# Patient Record
Sex: Male | Born: 1970 | Race: White | Hispanic: No | Marital: Married | Smoking: Never smoker
Health system: Southern US, Community
[De-identification: ages and names within clinical notes are randomized; demographics above are authoritative.]

## PROBLEM LIST (undated history)

## (undated) DIAGNOSIS — I2510 Atherosclerotic heart disease of native coronary artery without angina pectoris: Secondary | ICD-10-CM

## (undated) DIAGNOSIS — F419 Anxiety disorder, unspecified: Secondary | ICD-10-CM

## (undated) DIAGNOSIS — I2584 Coronary atherosclerosis due to calcified coronary lesion: Secondary | ICD-10-CM

## (undated) DIAGNOSIS — E119 Type 2 diabetes mellitus without complications: Secondary | ICD-10-CM

## (undated) DIAGNOSIS — I10 Essential (primary) hypertension: Secondary | ICD-10-CM

## (undated) DIAGNOSIS — I25110 Atherosclerotic heart disease of native coronary artery with unstable angina pectoris: Secondary | ICD-10-CM

## (undated) DIAGNOSIS — Z9989 Dependence on other enabling machines and devices: Secondary | ICD-10-CM

## (undated) DIAGNOSIS — G4733 Obstructive sleep apnea (adult) (pediatric): Secondary | ICD-10-CM

## (undated) DIAGNOSIS — E7800 Pure hypercholesterolemia, unspecified: Secondary | ICD-10-CM

## (undated) DIAGNOSIS — Z951 Presence of aortocoronary bypass graft: Secondary | ICD-10-CM

## (undated) HISTORY — PX: ORCHIOPEXY: SHX479

---

## 2016-07-16 ENCOUNTER — Emergency Department (HOSPITAL_COMMUNITY): Payer: 59

## 2016-07-16 ENCOUNTER — Other Ambulatory Visit: Payer: Self-pay | Admitting: Physician Assistant

## 2016-07-16 ENCOUNTER — Emergency Department (EMERGENCY_DEPARTMENT_HOSPITAL)
Admission: EM | Admit: 2016-07-16 | Discharge: 2016-07-16 | Disposition: A | Discharge status: Home / Self Care | Payer: 59 | Source: Home / Self Care | Attending: Emergency Medicine | Admitting: Emergency Medicine

## 2016-07-16 ENCOUNTER — Encounter (HOSPITAL_COMMUNITY): Payer: Self-pay | Admitting: *Deleted

## 2016-07-16 DIAGNOSIS — Z794 Long term (current) use of insulin: Secondary | ICD-10-CM | POA: Diagnosis not present

## 2016-07-16 DIAGNOSIS — F419 Anxiety disorder, unspecified: Secondary | ICD-10-CM | POA: Insufficient documentation

## 2016-07-16 DIAGNOSIS — E119 Type 2 diabetes mellitus without complications: Secondary | ICD-10-CM | POA: Insufficient documentation

## 2016-07-16 DIAGNOSIS — Z79899 Other long term (current) drug therapy: Secondary | ICD-10-CM

## 2016-07-16 DIAGNOSIS — Z7982 Long term (current) use of aspirin: Secondary | ICD-10-CM

## 2016-07-16 DIAGNOSIS — I25110 Atherosclerotic heart disease of native coronary artery with unstable angina pectoris: Secondary | ICD-10-CM | POA: Diagnosis present

## 2016-07-16 DIAGNOSIS — E785 Hyperlipidemia, unspecified: Secondary | ICD-10-CM | POA: Diagnosis present

## 2016-07-16 DIAGNOSIS — I2510 Atherosclerotic heart disease of native coronary artery without angina pectoris: Secondary | ICD-10-CM | POA: Insufficient documentation

## 2016-07-16 DIAGNOSIS — Z8249 Family history of ischemic heart disease and other diseases of the circulatory system: Secondary | ICD-10-CM

## 2016-07-16 DIAGNOSIS — I200 Unstable angina: Secondary | ICD-10-CM | POA: Diagnosis not present

## 2016-07-16 DIAGNOSIS — I10 Essential (primary) hypertension: Secondary | ICD-10-CM | POA: Diagnosis present

## 2016-07-16 DIAGNOSIS — R079 Chest pain, unspecified: Secondary | ICD-10-CM

## 2016-07-16 DIAGNOSIS — I2584 Coronary atherosclerosis due to calcified coronary lesion: Secondary | ICD-10-CM

## 2016-07-16 DIAGNOSIS — Z79891 Long term (current) use of opiate analgesic: Secondary | ICD-10-CM

## 2016-07-16 HISTORY — DX: Essential (primary) hypertension: I10

## 2016-07-16 HISTORY — DX: Anxiety disorder, unspecified: F41.9

## 2016-07-16 HISTORY — DX: Coronary atherosclerosis due to calcified coronary lesion: I25.84

## 2016-07-16 HISTORY — DX: Atherosclerotic heart disease of native coronary artery without angina pectoris: I25.10

## 2016-07-16 LAB — I-STAT TROPONIN, ED
TROPONIN I, POC: 0.01 ng/mL (ref 0.00–0.08)
Troponin i, poc: 0 ng/mL (ref 0.00–0.08)

## 2016-07-16 LAB — CBC
HCT: 45.1 % (ref 39.0–52.0)
HEMOGLOBIN: 15.5 g/dL (ref 13.0–17.0)
MCH: 32.4 pg (ref 26.0–34.0)
MCHC: 34.4 g/dL (ref 30.0–36.0)
MCV: 94.4 fL (ref 78.0–100.0)
PLATELETS: 336 10*3/uL (ref 150–400)
RBC: 4.78 MIL/uL (ref 4.22–5.81)
RDW: 12.1 % (ref 11.5–15.5)
WBC: 9.4 10*3/uL (ref 4.0–10.5)

## 2016-07-16 LAB — BASIC METABOLIC PANEL
Anion gap: 11 (ref 5–15)
BUN: 17 mg/dL (ref 6–20)
CALCIUM: 9.1 mg/dL (ref 8.9–10.3)
CO2: 23 mmol/L (ref 22–32)
CREATININE: 0.79 mg/dL (ref 0.61–1.24)
Chloride: 100 mmol/L — ABNORMAL LOW (ref 101–111)
GFR calc Af Amer: >60 mL/min (ref >60)
GLUCOSE: 276 mg/dL — AB (ref 65–99)
Potassium: 4.7 mmol/L (ref 3.5–5.1)
Sodium: 134 mmol/L — ABNORMAL LOW (ref 135–145)

## 2016-07-16 MED ORDER — ASPIRIN 81 MG PO CHEW: 81.0000 mg | CHEWABLE_TABLET | Freq: Every day | ORAL | 0 refills | Status: AC

## 2016-07-16 MED ORDER — IOPAMIDOL (ISOVUE-370) INJECTION 76%
INTRAVENOUS | Status: AC
  Administered 2016-07-16: 100 mL
  Filled 2016-07-16: qty 100

## 2016-07-16 NOTE — ED Triage Notes (Signed)
PT states started having chest pain when he was on the treadmill and now with exertion he is having chest pain, radiates to back and gets sob.  He has to stop what he is doing

## 2016-07-16 NOTE — ED Notes (Signed)
Repaged cards for Dr. Donnald GarrePfeiffer

## 2016-07-16 NOTE — ED Notes (Signed)
Patient transported to CT 

## 2016-07-16 NOTE — ED Provider Notes (Addendum)
[facility]-EMERGENCY DEPT Provider Note   CSN: [id number] Arrival date & time: 07/16/16  1037     History   Chief Complaint Chief Complaint  Patient presents with  . Chest Pain  . Back Pain    HPI Marylou MccoyBrandon Hollenberg is a 46 y.o. male.  HPI  This is a 46 year old man with Back and chest pain for the past several weeks. He describes the pain as occurring medial to the right scapula and going through his chest to the front. It occurs with exertion. He states that he previously exercised by running on the treadmill. He initially started having chest pain after about 10 minutes. He has to stop to relieve the pain. He has had worsening pain with exertion. He came in today because he states that he is unable to do his job consists of walking. He states to walk across ER is causing him pain today. He is not having pain while sitting here in the emergency department. He denies any associated symptoms such as dyspnea, sweating, nausea, vomiting, or leg swelling. He is denies any history of DVT or PE.   Past Medical History:  Diagnosis Date  . Diabetes mellitus without complication (HCC)     There are no active problems to display for this patient.   History reviewed. No pertinent surgical history.     Home Medications    Prior to Admission medications   Medication Sig Start Date End Date Taking? Authorizing Provider  aspirin 81 MG chewable tablet Chew 81 mg by mouth daily.   Yes [provider]  atorvastatin (LIPITOR) 10 MG tablet Take 10 mg by mouth daily.   Yes [provider]  glimepiride (AMARYL) 1 MG tablet Take 1 mg by mouth daily with breakfast.   Yes [provider]  insulin glargine (LANTUS) 100 UNIT/ML injection Inject 50 Units into the skin at bedtime.   Yes [provider]  lisinopril (PRINIVIL,ZESTRIL) 5 MG tablet Take 5 mg by mouth daily.   Yes [provider]  metFORMIN (GLUCOPHAGE) 1000 MG tablet Take 1,000 mg by mouth 2 (two) times  daily with a meal.   Yes [provider]  rOPINIRole (REQUIP) 0.5 MG tablet Take 0.25 mg by mouth at bedtime.   Yes [provider]  sertraline (ZOLOFT) 100 MG tablet Take 100 mg by mouth daily.   Yes [provider]    Family History No family history on file.  Social History Social History  Substance Use Topics  . Smoking status: Never Smoker  . Smokeless tobacco: Never Used  . Alcohol use No     Allergies   Patient has no known allergies.   Review of Systems Review of Systems  All other systems reviewed and are negative.    Physical Exam Updated Vital Signs BP (!) 127/95   Pulse (!) 102   Temp 98.3 F (36.8 C) (Oral)   Resp 16   SpO2 97%   Physical Exam  Constitutional: He is oriented to person, place, and time. He appears well-developed and well-nourished.  HENT:  Head: Normocephalic and atraumatic.  Eyes: EOM are normal. Pupils are equal, round, and reactive to light.  Neck: Normal range of motion. Neck supple.  Cardiovascular: Normal rate and regular rhythm.   Pulmonary/Chest: Effort normal and breath sounds normal.  Abdominal: Soft. Bowel sounds are normal.  Musculoskeletal: Normal range of motion. He exhibits no edema, tenderness or deformity.  Neurological: He is alert and oriented to person, place, and time.  Skin: Skin is warm and dry. Capillary refill takes less than 2 seconds.  Psychiatric: He has a normal mood and affect.  Nursing note and vitals reviewed.    ED Treatments / Results  Labs (all labs ordered are listed, but only abnormal results are displayed) Labs Reviewed  BASIC METABOLIC PANEL - Abnormal; Notable for the following:       Result Value   Sodium 134 (*)    Chloride 100 (*)    Glucose, Bld 276 (*)    All other components within normal limits  CBC  I-STAT TROPOININ, ED    EKG  EKG Interpretation  Date/Time:  Monday Jul 16 2016 10:43:38 EDT Ventricular Rate:  107 PR Interval:  136 QRS  Duration: 84 QT Interval:  320 QTC Calculation: 427 R Axis:   76 Text Interpretation:  Sinus tachycardia Otherwise normal ECG Confirmed by Luwanna Brossman MD, Duwayne Heck [phone]) on 07/16/2016 2:23:16 PM       Radiology Dg Chest 2 View  Result Date: 07/16/2016 CLINICAL DATA:  Chest pain for 2-3 weeks. EXAM: CHEST  2 VIEW COMPARISON:  None. FINDINGS: The heart size and mediastinal contours are within normal limits. Both lungs are clear. The visualized skeletal structures are unremarkable. IMPRESSION: Normal chest x-Barbarita Hutmacher. Electronically Signed   By: Rudie Meyer M.D.   On: 07/16/2016 11:20    Procedures Procedures (including critical care time)  Medications Ordered in ED Medications - No data to display   Initial Impression / Assessment and Plan / ED Course  I have reviewed the triage vital signs and the nursing notes.  Pertinent labs & imaging results that were available during my care of the patient were reviewed by me and considered in my medical decision making (see chart for details).     This 46 year old man with type 2 insulin-dependent diabetes And unstable angina. Initial EKG shows sinus tachycardia but no evidence of acute ischemia. Patient takes aspirin daily and took it today. CT anterior chest ordered to assess for pulmonary embolism and aortic dissection. However, given type pain is highly suspicious for cardiac etiology. Discussed with Dr. Clarice Pole. If CT is negative, plan admission to cardiology.  Final Clinical Impressions(s) / ED Diagnoses   Final diagnoses:  Chest pain    New Prescriptions New Prescriptions   No medications on file     Margarita Grizzle, MD 07/16/16 1603    Margarita Grizzle, MD 07/16/16 [phone]

## 2016-07-16 NOTE — ED Provider Notes (Signed)
Patient accepted signout from Dr. Rosalia Hammersay. Patient has had increasing episodes of chest pain with exertion suggestive of unstable angina. Patient advised he could not stay in the hospital but was agreeable to cardiology consultation in the emergency department. After consultation with cardiology, they also advised for inpatient treatment. The patient refuses inpatient treatment stating he must go home and has to resolve some things at work tomorrow. Outpatient follow-up appointment has been arranged by cardiology for immediate outpatient work-up.   [name] [name], Deuntae Kocsis, MD 07/16/16 2011

## 2016-07-16 NOTE — ED Notes (Signed)
Pt stable, ambulatory, states understanding of discharge instructions 

## 2016-07-16 NOTE — Consult Note (Signed)
Patient ID: [name] [name] MRN: [id number], DOB/AGE: [birth year]   Admit date: 07/16/2016  Requesting physician: Dr. Donnald Garre Primary Physician: Joline Salt, MD Primary Cardiologist: New Reason for admission: Chest pain  HPI:  [name] [name] is a 46 y.o. male with a history of IDDM, anxiety and HTN who presented to [facility] today with exertional mid back pain/epigastric pain.   The patient was diagnosed with diabetes in his late 20s/early 30s. He was maintained on oral anti-hyperglycemics but has more recently been on insulin. He says his hemoglobin A1c runs in the 8s. He regularly follows with the primary care physician and is maintained on an ACE inhibitor and statin. He was in his usual state of health until recently when he started noticing exertional central back pain with radiation into his epigastric area. He works for [employer] and carries a backpack and is very active on the job. He also exercises on a treadmill about 3 times a week. Yesterday he tried to walk on the treadmill and had to get off because of the exertional back pain/epigastric pain. It always quickly resolves with rest. He reports some slight shortness of breath. No diaphoresis or nausea. No LE edema, orthopnea or PND. No dizziness or syncope. No blood in stool or urine. No palpitations.   Today it continued to bother him at work so he presented to the [facility] ER for further evaluation. ECG showed sinus tachycardia. Troponin has been negative. CT angio showed no evidence of pulmonary embolism or aortic dissection; however it did comment on disproportionate coronary calcium.   Problem List  Past Medical History:  Diagnosis Date  . Anxiety   . Coronary artery calcification    a. noted on CT angio 07/16/16  . HTN (hypertension)   . Type 2 diabetes mellitus (HCC)     History reviewed. No pertinent surgical history.   Allergies  No Known Allergies   Home Medications  Prior to Admission medications     Medication Sig Start Date End Date Taking? Authorizing Provider  aspirin 81 MG chewable tablet Chew 81 mg by mouth daily.   Yes [provider]  atorvastatin (LIPITOR) 10 MG tablet Take 10 mg by mouth daily.   Yes [provider]  glimepiride (AMARYL) 1 MG tablet Take 1 mg by mouth daily with breakfast.   Yes [provider]  insulin glargine (LANTUS) 100 UNIT/ML injection Inject 50 Units into the skin at bedtime.   Yes [provider]  lisinopril (PRINIVIL,ZESTRIL) 5 MG tablet Take 5 mg by mouth daily.   Yes [provider]  metFORMIN (GLUCOPHAGE) 1000 MG tablet Take 1,000 mg by mouth 2 (two) times daily with a meal.   Yes [provider]  rOPINIRole (REQUIP) 0.5 MG tablet Take 0.25 mg by mouth at bedtime.   Yes [provider]  sertraline (ZOLOFT) 100 MG tablet Take 100 mg by mouth daily.   Yes [provider]    Family History  Family History  Problem Relation Age of Onset  . Heart failure Mother   . CAD Paternal Grandfather       Family Status  Relation Status  . Mother Deceased  . Paternal Grandfather Deceased     Social History  Social History   Social History  . Marital status: Married    Spouse name: N/A  . Number of children: N/A  . Years of education: N/A   Occupational History  . Not on file.   Social History Main  Topics  . Smoking status: Never Smoker  . Smokeless tobacco: Never Used  . Alcohol use No  . Drug use: No  . Sexual activity: Not on file   Other Topics Concern  . Not on file   Social History Narrative  . No narrative on file     Review of Systems General:  No chills, fever, night sweats or weight changes.  Cardiovascular:  +++ chest pain (epigastric/back pain), dyspnea on exertion, edema, orthopnea, palpitations, paroxysmal nocturnal dyspnea. Dermatological: No rash, lesions/masses Respiratory: No cough, dyspnea Urologic: No hematuria, dysuria Abdominal:   No  nausea, vomiting, diarrhea, bright red blood per rectum, melena, or hematemesis Neurologic:  No visual changes, wkns, changes in mental status. All other systems reviewed and are otherwise negative except as noted above.  Physical Exam  Blood pressure (!) 150/89, pulse (!) 116, temperature 98.3 F (36.8 C), temperature source Oral, resp. rate 13, SpO2 95 %.  General: Pleasant, NAD Psych: Normal affect. Neuro: Alert and oriented X 3. Moves all extremities spontaneously. HEENT: Normal  Neck: Supple without bruits or JVD. Lungs:  Resp regular and unlabored, CTA. Heart: RRR no s3, s4, or murmurs. Abdomen: Soft, non-tender, non-distended, BS + x 4.  Extremities: No clubbing, cyanosis or edema. DP/PT/Radials 2+ and equal bilaterally.  Labs  No results for input(s): CKTOTAL, CKMB, TROPONINI in the last 72 hours. Lab Results  Component Value Date   WBC 9.4 07/16/2016   HGB 15.5 07/16/2016   HCT 45.1 07/16/2016   MCV 94.4 07/16/2016   PLT 336 07/16/2016     Recent Labs Lab 07/16/16 1045  NA 134*  K 4.7  CL 100*  CO2 23  BUN 17  CREATININE 0.79  CALCIUM 9.1  GLUCOSE 276*   No results found for: CHOL, HDL, LDLCALC, TRIG No results found for: DDIMER   Radiology/Studies  Dg Chest 2 View  Result Date: 07/16/2016 CLINICAL DATA:  Chest pain for 2-3 weeks. EXAM: CHEST  2 VIEW COMPARISON:  None. FINDINGS: The heart size and mediastinal contours are within normal limits. Both lungs are clear. The visualized skeletal structures are unremarkable. IMPRESSION: Normal chest x-ray. Electronically Signed   By: Rudie Meyer M.D.   On: 07/16/2016 11:20   Ct Angio Chest Pe W Or Wo Contrast  Result Date: 07/16/2016 CLINICAL DATA:  Back pain for 1 week. EXAM: CT ANGIOGRAPHY CHEST WITH CONTRAST TECHNIQUE: Multidetector CT imaging of the chest was performed using the standard protocol during bolus administration of intravenous contrast. Multiplanar CT image reconstructions and MIPs were obtained  to evaluate the vascular anatomy. CONTRAST:  100 cc Isovue 370 COMPARISON:  07/16/2016 radiographs FINDINGS: Cardiovascular: Timing on today' s exam was optimized for assessment of the pulmonary arteries. No filling defect is identified in the pulmonary arterial tree to suggest pulmonary embolus. There is adequate contrast in the systemic arterial supply to assess the aorta, and no dissection is observed. No compelling findings of acute aortic injury. There does appear to be coronary artery atherosclerosis involving the left anterior descending and circumflex coronary arteries. This seems advanced for age, and potentially disproportionate to atherosclerosis in the aorta. Mediastinum/Nodes: There is potentially some mild wall thickening in the distal esophagus, a likely cause would be esophagitis. Lungs/Pleura: Unremarkable Upper Abdomen: Unremarkable Musculoskeletal: Unremarkable Review of the MIP images confirms the above findings. IMPRESSION: 1. No filling defect is identified in the pulmonary arterial tree to suggest pulmonary embolus. No acute aortic findings are identified. 2. Potential mild wall thickening in the distal  esophagus, esophagitis not excluded. Electronically Signed   By: Gaylyn RongWalter  Liebkemann M.D.   On: 07/16/2016 17:35    ECG Sinus tachycardia, HR 107  ASSESSMENT AND PLAN  Exertional chest pain: actually more central back pain and epigastric pain. It occurs with exertion and relieved with rest. Troponin neg x2 and ECG without acute ST or TW changes. CTA negative for PE or aortic dissection but did show disproportionate coronary calcium for age. We have recommended that he come in for observation and inpatient evaluation but he is very clear that he would like to go home. We will have an outpatient nuclear stress test arranged with close follow up. I have sent a message to our office for a stress test to be arranged ASAP. I will see him back in the office next week. Would recommend he start  taking a daily baby ASA  Sinus Tachycardia: he thinks this is related to his anxiety.   DMT2: continue current reigmen. Continue statin.   HTN: BP well controlled    Signed, Cline CrockKathryn Thompson, PA-C 07/16/2016, 8:06 PM  Pager [phone]   Attending note:  Patient seen and examined. Reviewed records and discussed the case with Ms. Molson Coors Brewinghompson PA-C.. We were consulted by Dr. Clarice PolePfeifer to evaluate Mr. Manson PasseyBrown for chest pain symptoms. He has a 15 year history of type 2 diabetes mellitus, also hypertension and anxiety. Follows regularly with PCP and reports compliance with his medications. He works for [employer], fairly active carrying a backpack, also exercises on the treadmill. States that over the last 2 weeks he has been experiencing exertional posterior thoracic discomfort radiating into his chest. He has noted this regularly in the last few days, no associated breathlessness, sometimes does have to slow down when he is walking on the treadmill and has the symptoms. He has not undergone any prior ischemic testing.  On examination he is comfortable without active chest pain. Heart rate 90s to 100s in sinus rhythm, systolic blood pressure 150s. Lungs are clear without labored breathing, cardiac exam with RRR and no gallop. Troponin I levels are negative 2, creatinine 0.79, hemoglobin 15.5, glucose 276. CT angiogram of the chest was negative for pulmonary embolus, incidentally did mention coronary artery atherosclerosis within the left anterior descending and circumflex distribution. Also mild esophageal wall thickening distally, rule out esophagitis.  Situation discussed with patient and his wife Music therapist(nurse). I recommended hospital admission for observation to further cycle cardiac enzymes and likely pursue an inpatient Myoview tomorrow morning. He states that he wants to go home instead and would rather pursue outpatient workup. His wife mentions that he does have a lot of trouble with anxiety, and being in the  hospital would make this much worse. We discussed further warning signs and symptoms, a return to the ER immediately if symptoms escalate, also the increased risk of delaying further workup as an outpatient. He states he understands this risk and is willing to accept it. We are therefore going to arrange for an outpatient exercise Myoview in the office within the next 24-48 hours, office will call him with time. He will have follow-up to review results.  Jonelle SidleSamuel G. Laneka Mcgrory, M.D., F.A.C.C.

## 2016-07-17 ENCOUNTER — Telehealth (HOSPITAL_COMMUNITY): Payer: Self-pay | Admitting: *Deleted

## 2016-07-17 NOTE — Telephone Encounter (Signed)
Patient given detailed instructions per Myocardial Perfusion Study Information Sheet for the test on 07/19/16 Patient notified to arrive 15 minutes early and that it is imperative to arrive on time for appointment to keep from having the test rescheduled.  If you need to cancel or reschedule your appointment, please call the office within 24 hours of your appointment. Failure to do so may result in a cancellation of your appointment, and a $50 no show fee. Patient verbalized understanding. Karron Goens Jacqueline    

## 2016-07-17 NOTE — ED Provider Notes (Signed)
Pt's employer faxed paperwork with regards to him returning to work. In reviewing the chart, it is completely inappropriate/unsafe for him to return to work without first having further testing/evaluation.   Raeford RazorKohut, [place] Doster, MD 07/17/16 [phone]

## 2016-07-19 ENCOUNTER — Observation Stay (HOSPITAL_COMMUNITY)
Admission: AD | Admit: 2016-07-19 | Discharge: 2016-07-20 | DRG: 287 | Disposition: A | Discharge status: Home / Self Care | Payer: 59 | Source: Ambulatory Visit | Attending: Cardiovascular Disease | Admitting: Cardiovascular Disease

## 2016-07-19 ENCOUNTER — Encounter (HOSPITAL_COMMUNITY): Payer: Self-pay | Admitting: General Practice

## 2016-07-19 ENCOUNTER — Encounter (HOSPITAL_COMMUNITY)
Admission: AD | Disposition: A | Discharge status: Home / Self Care | Payer: Self-pay | Source: Ambulatory Visit | Attending: Cardiovascular Disease

## 2016-07-19 ENCOUNTER — Encounter: Payer: Self-pay | Admitting: Internal Medicine

## 2016-07-19 ENCOUNTER — Ambulatory Visit (INDEPENDENT_AMBULATORY_CARE_PROVIDER_SITE_OTHER): Payer: 59 | Admitting: Internal Medicine

## 2016-07-19 ENCOUNTER — Encounter: Payer: Self-pay | Admitting: *Deleted

## 2016-07-19 ENCOUNTER — Ambulatory Visit (HOSPITAL_BASED_OUTPATIENT_CLINIC_OR_DEPARTMENT_OTHER): Payer: 59

## 2016-07-19 VITALS — BP 122/86 | HR 108 | Ht 69.0 in | Wt 175.0 lb

## 2016-07-19 DIAGNOSIS — I200 Unstable angina: Secondary | ICD-10-CM

## 2016-07-19 DIAGNOSIS — R9439 Abnormal result of other cardiovascular function study: Secondary | ICD-10-CM

## 2016-07-19 DIAGNOSIS — R079 Chest pain, unspecified: Secondary | ICD-10-CM

## 2016-07-19 DIAGNOSIS — I25110 Atherosclerotic heart disease of native coronary artery with unstable angina pectoris: Secondary | ICD-10-CM

## 2016-07-19 DIAGNOSIS — I2510 Atherosclerotic heart disease of native coronary artery without angina pectoris: Secondary | ICD-10-CM

## 2016-07-19 HISTORY — PX: LEFT HEART CATH AND CORONARY ANGIOGRAPHY: CATH118249

## 2016-07-19 HISTORY — DX: Pure hypercholesterolemia, unspecified: E78.00

## 2016-07-19 HISTORY — PX: CARDIAC CATHETERIZATION: SHX172

## 2016-07-19 HISTORY — DX: Obstructive sleep apnea (adult) (pediatric): G47.33

## 2016-07-19 HISTORY — DX: Dependence on other enabling machines and devices: Z99.89

## 2016-07-19 LAB — CBC
HEMATOCRIT: 41.7 % (ref 39.0–52.0)
HEMOGLOBIN: 14.7 g/dL (ref 13.0–17.0)
MCH: 32.5 pg (ref 26.0–34.0)
MCHC: 35.3 g/dL (ref 30.0–36.0)
MCV: 92.3 fL (ref 78.0–100.0)
Platelets: 316 10*3/uL (ref 150–400)
RBC: 4.52 MIL/uL (ref 4.22–5.81)
RDW: 12.1 % (ref 11.5–15.5)
WBC: 8.3 10*3/uL (ref 4.0–10.5)

## 2016-07-19 LAB — MYOCARDIAL PERFUSION IMAGING
CHL CUP MPHR: 176 {beats}/min
CHL CUP NUCLEAR SDS: 23
CHL CUP NUCLEAR SRS: 4
CHL CUP NUCLEAR SSS: 27
CSEPED: 9 min
CSEPEW: 10.1 METS
CSEPHR: 85 %
Exercise duration (sec): 31 s
LHR: 0.3
LV dias vol: 104 mL (ref 62–150)
LV sys vol: 60 mL
Peak HR: 150 {beats}/min
RPE: 19
Rest HR: 90 {beats}/min
TID: 1.11

## 2016-07-19 LAB — CREATININE, SERUM: Creatinine, Ser: 0.73 mg/dL (ref 0.61–1.24)

## 2016-07-19 LAB — PROTIME-INR
INR: 0.92
Prothrombin Time: 12.4 seconds (ref 11.4–15.2)

## 2016-07-19 LAB — GLUCOSE, CAPILLARY
GLUCOSE-CAPILLARY: 93 mg/dL (ref 65–99)
GLUCOSE-CAPILLARY: 164 mg/dL — AB (ref 65–99)
Glucose-Capillary: 187 mg/dL — ABNORMAL HIGH (ref 65–99)

## 2016-07-19 SURGERY — LEFT HEART CATH AND CORONARY ANGIOGRAPHY
Anesthesia: LOCAL

## 2016-07-19 MED ORDER — SODIUM CHLORIDE 0.9 % IV SOLN: 250.0000 mL | INTRAVENOUS | Status: DC | PRN

## 2016-07-19 MED ORDER — SODIUM CHLORIDE 0.9 % WEIGHT BASED INFUSION
3.0000 mL/kg/h | INTRAVENOUS | Status: DC
  Administered 2016-07-19: 3 mL/kg/h via INTRAVENOUS

## 2016-07-19 MED ORDER — ONDANSETRON HCL 4 MG/2ML IJ SOLN: 4.0000 mg | Freq: Four times a day (QID) | INTRAMUSCULAR | Status: DC | PRN

## 2016-07-19 MED ORDER — VERAPAMIL HCL 2.5 MG/ML IV SOLN
INTRAVENOUS | Status: AC
Start: 2016-07-19 — End: 2016-07-19
  Filled 2016-07-19: qty 2

## 2016-07-19 MED ORDER — LIDOCAINE HCL (PF) 1 % IJ SOLN
INTRAMUSCULAR | Status: DC | PRN
  Administered 2016-07-19: 5 mL via SUBCUTANEOUS

## 2016-07-19 MED ORDER — SODIUM CHLORIDE 0.9% FLUSH: 3.0000 mL | INTRAVENOUS | Status: DC | PRN

## 2016-07-19 MED ORDER — GLIMEPIRIDE 1 MG PO TABS
1.0000 mg | ORAL_TABLET | Freq: Every day | ORAL | Status: DC
  Administered 2016-07-20: 1 mg via ORAL
  Filled 2016-07-19: qty 1

## 2016-07-19 MED ORDER — ASPIRIN 81 MG PO CHEW
CHEWABLE_TABLET | ORAL | Status: AC
  Filled 2016-07-19: qty 1

## 2016-07-19 MED ORDER — FENTANYL CITRATE (PF) 100 MCG/2ML IJ SOLN
INTRAMUSCULAR | Status: DC | PRN
  Administered 2016-07-19: 25 ug via INTRAVENOUS

## 2016-07-19 MED ORDER — ACETAMINOPHEN 325 MG PO TABS: 650.0000 mg | ORAL_TABLET | ORAL | Status: DC | PRN

## 2016-07-19 MED ORDER — HEPARIN (PORCINE) IN NACL 2-0.9 UNIT/ML-% IJ SOLN
INTRAMUSCULAR | Status: AC
  Filled 2016-07-19: qty 1000

## 2016-07-19 MED ORDER — LISINOPRIL 5 MG PO TABS
5.0000 mg | ORAL_TABLET | Freq: Every day | ORAL | Status: DC
  Administered 2016-07-19 – 2016-07-20 (×2): 5 mg via ORAL
  Filled 2016-07-19 (×2): qty 1

## 2016-07-19 MED ORDER — TECHNETIUM TC 99M TETROFOSMIN IV KIT
32.6000 | PACK | Freq: Once | INTRAVENOUS | Status: AC | PRN
  Administered 2016-07-19: 32.6 via INTRAVENOUS
  Filled 2016-07-19: qty 33

## 2016-07-19 MED ORDER — IOPAMIDOL (ISOVUE-370) INJECTION 76%
INTRAVENOUS | Status: DC | PRN
Start: 2016-07-19 — End: 2016-07-19
  Administered 2016-07-19: 60 mL via INTRA_ARTERIAL

## 2016-07-19 MED ORDER — INSULIN ASPART 100 UNIT/ML ~~LOC~~ SOLN: 0.0000 [IU] | Freq: Three times a day (TID) | SUBCUTANEOUS | Status: DC

## 2016-07-19 MED ORDER — ASPIRIN 81 MG PO CHEW: 81.0000 mg | CHEWABLE_TABLET | Freq: Every day | ORAL | Status: DC

## 2016-07-19 MED ORDER — ASPIRIN 81 MG PO CHEW
81.0000 mg | CHEWABLE_TABLET | ORAL | Status: AC
  Administered 2016-07-19: 81 mg via ORAL

## 2016-07-19 MED ORDER — SODIUM CHLORIDE 0.9% FLUSH
3.0000 mL | Freq: Two times a day (BID) | INTRAVENOUS | Status: DC
  Administered 2016-07-20: 3 mL via INTRAVENOUS

## 2016-07-19 MED ORDER — HEPARIN (PORCINE) IN NACL 2-0.9 UNIT/ML-% IJ SOLN
INTRAMUSCULAR | Status: AC | PRN
  Administered 2016-07-19: 1000 mL

## 2016-07-19 MED ORDER — HEPARIN SODIUM (PORCINE) 5000 UNIT/ML IJ SOLN
5000.0000 [IU] | Freq: Three times a day (TID) | INTRAMUSCULAR | Status: DC
  Administered 2016-07-19 – 2016-07-20 (×2): 5000 [IU] via SUBCUTANEOUS
  Filled 2016-07-19 (×2): qty 1

## 2016-07-19 MED ORDER — IOPAMIDOL (ISOVUE-370) INJECTION 76%
INTRAVENOUS | Status: AC
  Filled 2016-07-19: qty 100

## 2016-07-19 MED ORDER — ASPIRIN 81 MG PO CHEW
81.0000 mg | CHEWABLE_TABLET | Freq: Every day | ORAL | Status: DC
  Administered 2016-07-20: 81 mg via ORAL
  Filled 2016-07-19: qty 1

## 2016-07-19 MED ORDER — HEPARIN SODIUM (PORCINE) 1000 UNIT/ML IJ SOLN
INTRAMUSCULAR | Status: AC
  Filled 2016-07-19: qty 1

## 2016-07-19 MED ORDER — ROPINIROLE HCL 0.25 MG PO TABS
0.2500 mg | ORAL_TABLET | Freq: Every day | ORAL | Status: DC
  Administered 2016-07-19: 0.25 mg via ORAL
  Filled 2016-07-19 (×2): qty 1

## 2016-07-19 MED ORDER — SODIUM CHLORIDE 0.9 % WEIGHT BASED INFUSION: 1.0000 mL/kg/h | INTRAVENOUS | Status: DC

## 2016-07-19 MED ORDER — MIDAZOLAM HCL 2 MG/2ML IJ SOLN
INTRAMUSCULAR | Status: AC
  Filled 2016-07-19: qty 2

## 2016-07-19 MED ORDER — ATORVASTATIN CALCIUM 10 MG PO TABS
10.0000 mg | ORAL_TABLET | Freq: Every day | ORAL | Status: DC
  Administered 2016-07-19: 10 mg via ORAL
  Filled 2016-07-19: qty 1

## 2016-07-19 MED ORDER — MIDAZOLAM HCL 2 MG/2ML IJ SOLN
INTRAMUSCULAR | Status: DC | PRN
  Administered 2016-07-19: 2 mg via INTRAVENOUS

## 2016-07-19 MED ORDER — SODIUM CHLORIDE 0.9% FLUSH: 3.0000 mL | Freq: Two times a day (BID) | INTRAVENOUS | Status: DC

## 2016-07-19 MED ORDER — HEPARIN SODIUM (PORCINE) 1000 UNIT/ML IJ SOLN
INTRAMUSCULAR | Status: DC | PRN
  Administered 2016-07-19: 4000 [IU] via INTRAVENOUS

## 2016-07-19 MED ORDER — SODIUM CHLORIDE 0.9 % IV SOLN
INTRAVENOUS | Status: AC
  Administered 2016-07-19: 22:00:00 via INTRAVENOUS

## 2016-07-19 MED ORDER — INSULIN GLARGINE 100 UNIT/ML ~~LOC~~ SOLN
50.0000 [IU] | Freq: Every day | SUBCUTANEOUS | Status: DC
  Administered 2016-07-19: 50 [IU] via SUBCUTANEOUS
  Filled 2016-07-19 (×2): qty 0.5

## 2016-07-19 MED ORDER — LIDOCAINE HCL 1 % IJ SOLN
INTRAMUSCULAR | Status: AC
  Filled 2016-07-19: qty 20

## 2016-07-19 MED ORDER — SERTRALINE HCL 100 MG PO TABS
100.0000 mg | ORAL_TABLET | Freq: Every day | ORAL | Status: DC
  Administered 2016-07-19 – 2016-07-20 (×2): 100 mg via ORAL
  Filled 2016-07-19 (×2): qty 1

## 2016-07-19 MED ORDER — TECHNETIUM TC 99M TETROFOSMIN IV KIT
11.0000 | PACK | Freq: Once | INTRAVENOUS | Status: AC | PRN
  Administered 2016-07-19: 11 via INTRAVENOUS
  Filled 2016-07-19: qty 11

## 2016-07-19 MED ORDER — VERAPAMIL HCL 2.5 MG/ML IV SOLN
INTRAVENOUS | Status: DC | PRN
  Administered 2016-07-19: 10 mL via INTRA_ARTERIAL

## 2016-07-19 MED ORDER — FENTANYL CITRATE (PF) 100 MCG/2ML IJ SOLN
INTRAMUSCULAR | Status: AC
  Filled 2016-07-19: qty 2

## 2016-07-19 SURGICAL SUPPLY — 9 items
CATH 5FR JL3.5 JR4 ANG PIG MP (CATHETERS) ×2 IMPLANT
DEVICE RAD COMP TR BAND LRG (VASCULAR PRODUCTS) ×2 IMPLANT
GLIDESHEATH SLEND SS 6F .021 (SHEATH) ×2 IMPLANT
GUIDEWIRE INQWIRE 1.5J.035X260 (WIRE) ×1 IMPLANT
INQWIRE 1.5J .035X260CM (WIRE) ×2
KIT HEART LEFT (KITS) ×2 IMPLANT
PACK CARDIAC CATHETERIZATION (CUSTOM PROCEDURE TRAY) ×2 IMPLANT
TRANSDUCER W/STOPCOCK (MISCELLANEOUS) ×2 IMPLANT
TUBING CIL FLEX 10 FLL-RA (TUBING) ×2 IMPLANT

## 2016-07-19 NOTE — Progress Notes (Signed)
Follow-up Outpatient Visit Date: 07/19/2016  Primary Care Provider: [name], [name], MD [address] [place] [id number]  Chief Complaint: Chest pain and abnormal stress test  HPI:  Mr. [name] [name] is a 46 y.o. year-old male with history of hypertension, type 2 diabetes mellitus, and anxiety, who presents for follow-up of chest pain. He is added onto my schedule due to high risk stress test today. He notes that over the last 2-3 weeks, he has had discomfort in his back that occasionally radiates to the anterior chest with exertion. 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 The symptoms typically improve with rest over the course of a few minutes. He has not had any accompanying symptoms like shortness of breath, nausea, diaphoresis, and palpitations. He presented to the ER where he was ruled out by troponins. He was seen by cardiology at that time and advised to stay for observation and inpatient stress testing. However, the patient declined and was referred for outpatient stress test today. CTA chest at the time his ED visit was notable for significant coronary artery calcium.  Exercise myocardial perfusion stress test today was notable for reproducible back and chest pain as well as drop in blood pressure with exercise. EKG demonstrates 2-3 mm horizontal ST depressions in the inferior leads. Perfusion images demonstrated a large, completely reversible defect involving the anterior wall and septum. He is currently asymptomatic.  --------------------------------------------------------------------------------------------------  Cardiovascular History & Procedures: Cardiovascular Problems:  Unstable angina with high-risk stress test  Risk Factors:  Hypertension, diabetes mellitus, and male gender  Cath/PCI:  None  CV Surgery:  None  EP Procedures and  Devices:  None  Non-Invasive Evaluation(s):  Exercise MPI (prelim review, 07/19/16): High risk study with 2-3 mm inferior ST depression at peak stress. Patient had reproducible chest and back pain as well as pressure dropped to exercise. Prelim perfusion images demonstrate a large anterior, septal, and apical reversible defect.  Recent CV Pertinent Labs: Lab Results  Component Value Date   K 4.7 07/16/2016   BUN 17 07/16/2016   CREATININE 0.79 07/16/2016    Past medical and surgical history were reviewed and updated in EPIC.  Outpatient Encounter Prescriptions as of 07/19/2016  Medication Sig  . aspirin 81 MG chewable tablet Chew 1 tablet (81 mg total) by mouth daily.  Marland Kitchen. atorvastatin (LIPITOR) 10 MG tablet Take 10 mg by mouth daily.  Marland Kitchen. glimepiride (AMARYL) 1 MG tablet Take 1 mg by mouth daily with breakfast.  . insulin glargine (LANTUS) 100 UNIT/ML injection Inject 50 Units into the skin at bedtime.  Marland Kitchen. lisinopril (PRINIVIL,ZESTRIL) 5 MG tablet Take 5 mg by mouth daily.  . metFORMIN (GLUCOPHAGE) 1000 MG tablet Take 1,000 mg by mouth 2 (two) times daily with a meal.  . rOPINIRole (REQUIP) 0.5 MG tablet Take 0.25 mg by mouth at bedtime.  . sertraline (ZOLOFT) 100 MG tablet Take 100 mg by mouth daily.  . [DISCONTINUED] aspirin 81 MG chewable tablet Chew 81 mg by mouth daily.   No facility-administered encounter medications on file as of 07/19/2016.     Allergies: Patient has no known allergies.  Social History   Social History  . Marital status: Married    Spouse name: N/A  . Number of children: N/A  . Years of education: N/A   Occupational History  . Not on file.  Social History Main Topics  . Smoking status: Never Smoker  . Smokeless tobacco: Never Used  . Alcohol use No  . Drug use: No  . Sexual activity: Not on file   Other Topics Concern  . Not on file   Social History Narrative  . No narrative on file    Family History  Problem Relation Age of Onset  .  Heart failure Mother   . CAD Paternal Grandfather     Review of Systems: A 12-system review of systems was performed and was negative except as noted in the HPI.  --------------------------------------------------------------------------------------------------  Physical Exam: BP 122/86 (BP Location: Left Arm, Patient Position: Sitting, Cuff Size: Normal)   Pulse (!) 108   Ht 5\' 9"  (1.753 m)   Wt 175 lb (79.4 kg)   SpO2 99%   BMI 25.84 kg/m   General:  Slender, anxious appearing man seated comfortably in the exam room. HEENT: No conjunctival pallor or scleral icterus.  Moist mucous membranes.  OP clear. Neck: Supple without lymphadenopathy, thyromegaly, JVD, or HJR.  No carotid bruit. Lungs: Normal work of breathing.  Clear to auscultation bilaterally without wheezes or crackles. Heart: Regular rate and rhythm without murmurs, rubs, or gallops.  Non-displaced PMI. Abd: Bowel sounds present.  Soft, NT/ND without hepatosplenomegaly Ext: No lower extremity edema.  Radial, PT, and DP pulses are 2+ bilaterally. Skin: warm and dry without rash  EKG:  Baseline EKG at the time a stress test demonstrates sinus rhythm with nonspecific T-wave changes.  Lab Results  Component Value Date   WBC 9.4 07/16/2016   HGB 15.5 07/16/2016   HCT 45.1 07/16/2016   MCV 94.4 07/16/2016   PLT 336 07/16/2016    Lab Results  Component Value Date   NA 134 (L) 07/16/2016   K 4.7 07/16/2016   CL 100 (L) 07/16/2016   CO2 23 07/16/2016   BUN 17 07/16/2016   CREATININE 0.79 07/16/2016   GLUCOSE 276 (H) 07/16/2016    No results found for: CHOL, HDL, LDLCALC, LDLDIRECT, TRIG, CHOLHDL  --------------------------------------------------------------------------------------------------  ASSESSMENT AND PLAN: Unstable angina with high-risk stress test Given recent onset of symptoms now present with minimal exertion, I am concerned for unstable angina. Though he is currently asymptomatic, I believe that  expedited coronary angiography needs to be pursued given recent progression of symptoms now present with mild activity and high risk stress test. We will plan to refer him for left heart catheterization with possible PCI today, as he is NPO and has not taken his metformin yet. I have reviewed the risks, indications, and alternatives to cardiac catheterization, possible angioplasty, and stenting with the patient. Risks include but are not limited to bleeding, infection, vascular injury, stroke, myocardial infection, arrhythmia, kidney injury, radiation-related injury in the case of prolonged fluoroscopy use, emergency cardiac surgery, and death. The patient understands the risks of serious complication is 1-2 in 1000 with diagnostic cardiac cath and 1-2% or less with angioplasty/stenting.  Follow-up: TBD based on cardiac catheterization.  Yvonne Kendall, MD 07/19/2016 10:35 AM

## 2016-07-19 NOTE — Patient Instructions (Addendum)
Medication Instructions:  Your physician recommends that you continue on your current medications as directed. Please refer to the Current Medication list given to you today.   Labwork: None   Testing/Procedures: Your physician has requested that you have a cardiac catheterization. Cardiac catheterization is used to diagnose and/or treat various heart conditions. Doctors may recommend this procedure for a number of different reasons. The most common reason is to evaluate chest pain. Chest pain can be a symptom of coronary artery disease (CAD), and cardiac catheterization can show whether plaque is narrowing or blocking your heart's arteries. This procedure is also used to evaluate the valves, as well as measure the blood flow and oxygen levels in different parts of your heart. For further information please visit [email or web address]. Please follow instruction sheet, as given.  TODAY  [facility]  Follow-Up: Follow-up will be determined after the Cardiac Catheterization has been done.         If you need a refill on your cardiac medications before your next appointment, please call your pharmacy.

## 2016-07-19 NOTE — H&P (View-Only) (Signed)
Follow-up Outpatient Visit Date: 07/19/2016  Primary Care Provider: [name], [name], MD [address] [place] [address]  Chief Complaint: Chest pain and abnormal stress test  HPI:  [name] [name] is a 46 y.o. year-old male with history of hypertension, type 2 diabetes mellitus, and anxiety, who presents for follow-up of chest pain. He is added onto my schedule due to high risk stress test today. He notes that over the last 2-3 weeks, he has had discomfort in his back that occasionally radiates to the anterior chest with exertion. 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 The symptoms typically improve with rest over the course of a few minutes. He has not had any accompanying symptoms like shortness of breath, nausea, diaphoresis, and palpitations. He presented to the ER where he was ruled out by troponins. He was seen by cardiology at that time and advised to stay for observation and inpatient stress testing. However, the patient declined and was referred for outpatient stress test today. CTA chest at the time his ED visit was notable for significant coronary artery calcium.  Exercise myocardial perfusion stress test today was notable for reproducible back and chest pain as well as drop in blood pressure with exercise. EKG demonstrates 2-3 mm horizontal ST depressions in the inferior leads. Perfusion images demonstrated a large, completely reversible defect involving the anterior wall and septum. He is currently asymptomatic.  --------------------------------------------------------------------------------------------------  Cardiovascular History & Procedures: Cardiovascular Problems:  Unstable angina with high-risk stress test  Risk Factors:  Hypertension, diabetes mellitus, and male gender  Cath/PCI:  None  CV Surgery:  None  EP Procedures and  Devices:  None  Non-Invasive Evaluation(s):  Exercise MPI (prelim review, 07/19/16): High risk study with 2-3 mm inferior ST depression at peak stress. Patient had reproducible chest and back pain as well as pressure dropped to exercise. Prelim perfusion images demonstrate a large anterior, septal, and apical reversible defect.  Recent CV Pertinent Labs: Lab Results  Component Value Date   K 4.7 07/16/2016   BUN 17 07/16/2016   CREATININE 0.79 07/16/2016    Past medical and surgical history were reviewed and updated in EPIC.  Outpatient Encounter Prescriptions as of 07/19/2016  Medication Sig  . aspirin 81 MG chewable tablet Chew 1 tablet (81 mg total) by mouth daily.  Marland Kitchen. atorvastatin (LIPITOR) 10 MG tablet Take 10 mg by mouth daily.  Marland Kitchen. glimepiride (AMARYL) 1 MG tablet Take 1 mg by mouth daily with breakfast.  . insulin glargine (LANTUS) 100 UNIT/ML injection Inject 50 Units into the skin at bedtime.  Marland Kitchen. lisinopril (PRINIVIL,ZESTRIL) 5 MG tablet Take 5 mg by mouth daily.  . metFORMIN (GLUCOPHAGE) 1000 MG tablet Take 1,000 mg by mouth 2 (two) times daily with a meal.  . rOPINIRole (REQUIP) 0.5 MG tablet Take 0.25 mg by mouth at bedtime.  . sertraline (ZOLOFT) 100 MG tablet Take 100 mg by mouth daily.  . [DISCONTINUED] aspirin 81 MG chewable tablet Chew 81 mg by mouth daily.   No facility-administered encounter medications on file as of 07/19/2016.     Allergies: Patient has no known allergies.  Social History   Social History  . Marital status: Married    Spouse name: N/A  . Number of children: N/A  . Years of education: N/A   Occupational History  . Not on file.  Social History Main Topics  . Smoking status: Never Smoker  . Smokeless tobacco: Never Used  . Alcohol use No  . Drug use: No  . Sexual activity: Not on file   Other Topics Concern  . Not on file   Social History Narrative  . No narrative on file    Family History  Problem Relation Age of Onset  .  Heart failure Mother   . CAD Paternal Grandfather     Review of Systems: A 12-system review of systems was performed and was negative except as noted in the HPI.  --------------------------------------------------------------------------------------------------  Physical Exam: BP 122/86 (BP Location: Left Arm, Patient Position: Sitting, Cuff Size: Normal)   Pulse (!) 108   Ht 5\' 9"  (1.753 m)   Wt 175 lb (79.4 kg)   SpO2 99%   BMI 25.84 kg/m   General:  Slender, anxious appearing man seated comfortably in the exam room. HEENT: No conjunctival pallor or scleral icterus.  Moist mucous membranes.  OP clear. Neck: Supple without lymphadenopathy, thyromegaly, JVD, or HJR.  No carotid bruit. Lungs: Normal work of breathing.  Clear to auscultation bilaterally without wheezes or crackles. Heart: Regular rate and rhythm without murmurs, rubs, or gallops.  Non-displaced PMI. Abd: Bowel sounds present.  Soft, NT/ND without hepatosplenomegaly Ext: No lower extremity edema.  Radial, PT, and DP pulses are 2+ bilaterally. Skin: warm and dry without rash  EKG:  Baseline EKG at the time a stress test demonstrates sinus rhythm with nonspecific T-wave changes.  Lab Results  Component Value Date   WBC 9.4 07/16/2016   HGB 15.5 07/16/2016   HCT 45.1 07/16/2016   MCV 94.4 07/16/2016   PLT 336 07/16/2016    Lab Results  Component Value Date   NA 134 (L) 07/16/2016   K 4.7 07/16/2016   CL 100 (L) 07/16/2016   CO2 23 07/16/2016   BUN 17 07/16/2016   CREATININE 0.79 07/16/2016   GLUCOSE 276 (H) 07/16/2016    No results found for: CHOL, HDL, LDLCALC, LDLDIRECT, TRIG, CHOLHDL  --------------------------------------------------------------------------------------------------  ASSESSMENT AND PLAN: Unstable angina with high-risk stress test Given recent onset of symptoms now present with minimal exertion, I am concerned for unstable angina. Though he is currently asymptomatic, I believe that  expedited coronary angiography needs to be pursued given recent progression of symptoms now present with mild activity and high risk stress test. We will plan to refer him for left heart catheterization with possible PCI today, as he is NPO and has not taken his metformin yet. I have reviewed the risks, indications, and alternatives to cardiac catheterization, possible angioplasty, and stenting with the patient. Risks include but are not limited to bleeding, infection, vascular injury, stroke, myocardial infection, arrhythmia, kidney injury, radiation-related injury in the case of prolonged fluoroscopy use, emergency cardiac surgery, and death. The patient understands the risks of serious complication is 1-2 in 1000 with diagnostic cardiac cath and 1-2% or less with angioplasty/stenting.  Follow-up: TBD based on cardiac catheterization.  Yvonne Kendall, MD 07/19/2016 10:35 AM

## 2016-07-19 NOTE — Interval H&P Note (Signed)
Cath Lab Visit (complete for each Cath Lab visit)  Clinical Evaluation Leading to the Procedure:   ACS: Yes.    Non-ACS:    Anginal Classification: CCS IV  Anti-ischemic medical therapy: Minimal Therapy (1 class of medications)  Non-Invasive Test Results: No non-invasive testing performed  Prior CABG: No previous CABG      History and Physical Interval Note:  07/19/2016 6:27 PM  Marylou MccoyBrandon Fentress  has presented today for surgery, with the diagnosis of cp  The various methods of treatment have been discussed with the patient and family. After consideration of risks, benefits and other options for treatment, the patient has consented to  Procedure(s): Left Heart Cath and Coronary Angiography (N/A) as a surgical intervention .  The patient's history has been reviewed, patient examined, no change in status, stable for surgery.  I have reviewed the patient's chart and labs.  Questions were answered to the patient's satisfaction.     Lance MussJayadeep Varanasi

## 2016-07-19 NOTE — Progress Notes (Signed)
Pt states that he will self administer CPAP when ready for bed.  RT to monitor and assess as needed.  

## 2016-07-20 ENCOUNTER — Encounter (HOSPITAL_COMMUNITY): Payer: Self-pay | Admitting: Interventional Cardiology

## 2016-07-20 ENCOUNTER — Inpatient Hospital Stay (HOSPITAL_COMMUNITY): Payer: 59

## 2016-07-20 ENCOUNTER — Other Ambulatory Visit: Payer: Self-pay | Admitting: *Deleted

## 2016-07-20 DIAGNOSIS — Z01810 Encounter for preprocedural cardiovascular examination: Secondary | ICD-10-CM

## 2016-07-20 DIAGNOSIS — I200 Unstable angina: Secondary | ICD-10-CM | POA: Diagnosis not present

## 2016-07-20 DIAGNOSIS — I25110 Atherosclerotic heart disease of native coronary artery with unstable angina pectoris: Secondary | ICD-10-CM

## 2016-07-20 DIAGNOSIS — I2510 Atherosclerotic heart disease of native coronary artery without angina pectoris: Secondary | ICD-10-CM

## 2016-07-20 LAB — SPIROMETRY WITH GRAPH
FEF 25-75 Post: 2.74 L/sec
FEF 25-75 Pre: 4.68 L/sec
FEF2575-%Change-Post: -41 %
FEF2575-%Pred-Post: 75 %
FEF2575-%Pred-Pre: 128 %
FEV1-%CHANGE-POST: -15 %
FEV1-%PRED-PRE: 107 %
FEV1-%Pred-Post: 91 %
FEV1-POST: 3.61 L
FEV1-Pre: 4.25 L
FEV1FVC-%Change-Post: -10 %
FEV1FVC-%Pred-Pre: 106 %
FEV6-%Change-Post: -3 %
FEV6-%PRED-POST: 98 %
FEV6-%PRED-PRE: 101 %
FEV6-POST: 4.78 L
FEV6-Pre: 4.97 L
FEV6FVC-%CHANGE-POST: 0 %
FEV6FVC-%PRED-POST: 103 %
FEV6FVC-%PRED-PRE: 102 %
FVC-%CHANGE-POST: -4 %
FVC-%PRED-POST: 95 %
FVC-%Pred-Pre: 100 %
FVC-POST: 4.81 L
FVC-Pre: 5.06 L
PRE FEV6/FVC RATIO: 100 %
Post FEV1/FVC ratio: 75 %
Post FEV6/FVC ratio: 100 %
Pre FEV1/FVC ratio: 84 %

## 2016-07-20 LAB — COMPREHENSIVE METABOLIC PANEL
ALT: 34 U/L (ref 17–63)
ANION GAP: 10 (ref 5–15)
AST: 21 U/L (ref 15–41)
Albumin: 3.8 g/dL (ref 3.5–5.0)
Alkaline Phosphatase: 53 U/L (ref 38–126)
BILIRUBIN TOTAL: 1 mg/dL (ref 0.3–1.2)
BUN: 12 mg/dL (ref 6–20)
CO2: 22 mmol/L (ref 22–32)
Calcium: 9.3 mg/dL (ref 8.9–10.3)
Chloride: 103 mmol/L (ref 101–111)
Creatinine, Ser: 0.71 mg/dL (ref 0.61–1.24)
GLUCOSE: 192 mg/dL — AB (ref 65–99)
POTASSIUM: 4.1 mmol/L (ref 3.5–5.1)
Sodium: 135 mmol/L (ref 135–145)
Total Protein: 6.9 g/dL (ref 6.5–8.1)

## 2016-07-20 LAB — URINALYSIS, COMPLETE (UACMP) WITH MICROSCOPIC
BACTERIA UA: NONE SEEN
Bilirubin Urine: NEGATIVE
Glucose, UA: >=500 mg/dL — AB
Hgb urine dipstick: NEGATIVE
KETONES UR: 20 mg/dL — AB
Leukocytes, UA: NEGATIVE
Nitrite: NEGATIVE
PROTEIN: NEGATIVE mg/dL
SQUAMOUS EPITHELIAL / LPF: NONE SEEN
Specific Gravity, Urine: 1.018 (ref 1.005–1.030)
pH: 6 (ref 5.0–8.0)

## 2016-07-20 LAB — VAS US DOPPLER PRE CABG
LCCAPSYS: 73 cm/s
LEFT ECA DIAS: -12 cm/s
LEFT VERTEBRAL DIAS: -14 cm/s
LICADSYS: -58 cm/s
Left CCA dist dias: 27 cm/s
Left CCA dist sys: 106 cm/s
Left CCA prox dias: 15 cm/s
Left ICA dist dias: -29 cm/s
Left ICA prox dias: -26 cm/s
Left ICA prox sys: -68 cm/s
RCCADSYS: -71 cm/s
RCCAPDIAS: 14 cm/s
RIGHT ECA DIAS: -15 cm/s
RIGHT VERTEBRAL DIAS: -11 cm/s
Right CCA prox sys: 64 cm/s

## 2016-07-20 LAB — CBC
HCT: 45.1 % (ref 39.0–52.0)
Hemoglobin: 15.4 g/dL (ref 13.0–17.0)
MCH: 32 pg (ref 26.0–34.0)
MCHC: 34.1 g/dL (ref 30.0–36.0)
MCV: 93.8 fL (ref 78.0–100.0)
PLATELETS: 350 10*3/uL (ref 150–400)
RBC: 4.81 MIL/uL (ref 4.22–5.81)
RDW: 12.2 % (ref 11.5–15.5)
WBC: 7.7 10*3/uL (ref 4.0–10.5)

## 2016-07-20 LAB — LIPID PANEL
CHOL/HDL RATIO: 4.6 ratio
Cholesterol: 158 mg/dL (ref 0–200)
HDL: 34 mg/dL — AB (ref >40)
LDL Cholesterol: 110 mg/dL — ABNORMAL HIGH (ref 0–99)
TRIGLYCERIDES: 68 mg/dL (ref <150)
VLDL: 14 mg/dL (ref 0–40)

## 2016-07-20 LAB — TYPE AND SCREEN
ABO/RH(D): O POS
Antibody Screen: NEGATIVE

## 2016-07-20 LAB — BLOOD GAS, ARTERIAL
Acid-base deficit: 0.3 mmol/L (ref 0.0–2.0)
Bicarbonate: 23.2 mmol/L (ref 20.0–28.0)
DRAWN BY: 244851
O2 Saturation: 97.2 %
PATIENT TEMPERATURE: 98.6
PO2 ART: 94.3 mmHg (ref 83.0–108.0)
pCO2 arterial: 33.4 mmHg (ref 32.0–48.0)
pH, Arterial: 7.455 — ABNORMAL HIGH (ref 7.350–7.450)

## 2016-07-20 LAB — ABO/RH: ABO/RH(D): O POS

## 2016-07-20 LAB — PREALBUMIN: Prealbumin: 28.6 mg/dL (ref 18–38)

## 2016-07-20 LAB — GLUCOSE, CAPILLARY: GLUCOSE-CAPILLARY: 124 mg/dL — AB (ref 65–99)

## 2016-07-20 MED ORDER — ATORVASTATIN CALCIUM 40 MG PO TABS: 40.0000 mg | ORAL_TABLET | Freq: Every day | ORAL | Status: DC

## 2016-07-20 MED ORDER — METOPROLOL TARTRATE 25 MG PO TABS
25.0000 mg | ORAL_TABLET | Freq: Two times a day (BID) | ORAL | Status: DC
  Administered 2016-07-20: 25 mg via ORAL
  Filled 2016-07-20: qty 1

## 2016-07-20 MED ORDER — ALBUTEROL SULFATE (2.5 MG/3ML) 0.083% IN NEBU
2.5000 mg | INHALATION_SOLUTION | Freq: Once | RESPIRATORY_TRACT | Status: AC
  Administered 2016-07-20: 2.5 mg via RESPIRATORY_TRACT

## 2016-07-20 MED ORDER — METOPROLOL TARTRATE 25 MG PO TABS: 25.0000 mg | ORAL_TABLET | Freq: Two times a day (BID) | ORAL | 2 refills | Status: DC

## 2016-07-20 MED ORDER — ATORVASTATIN CALCIUM 40 MG PO TABS: 40.0000 mg | ORAL_TABLET | Freq: Every day | ORAL | 11 refills | Status: DC

## 2016-07-20 MED ORDER — NITROGLYCERIN 0.4 MG SL SUBL: 0.4000 mg | SUBLINGUAL_TABLET | SUBLINGUAL | 12 refills | Status: DC | PRN

## 2016-07-20 MED FILL — Heparin Sodium (Porcine) Inj 1000 Unit/ML: INTRAMUSCULAR | Qty: 10 | Status: AC

## 2016-07-20 MED FILL — Heparin Sodium (Porcine) 2 Unit/ML in Sodium Chloride 0.9%: INTRAMUSCULAR | Qty: 1000 | Status: AC

## 2016-07-20 NOTE — Discharge Summary (Signed)
Discharge Summary    Patient ID: Marylou MccoyBrandon Gottschall,  MRN: [id number], DOB/AGE: 45/10/1970 45 y.o.  Admit date: 07/19/2016 Discharge date: 07/20/2016  Primary Care Provider: Tillman AbideLetvak, [name] Primary Cardiologist: Dr. Okey DupreEnd   Discharge Diagnoses    Active Problems:   Unstable angina (HCC)   Allergies No Known Allergies   History of Present Illness     Mr. Manson PasseyBrown is a 46 year old make with history of HTN, DM II, anxiety.   He was seen in the ER on 5/7 for exertional chest pain concerning for Unstable Angina, new onset over the past 2-3 weeks. He ruled out with his Troponins, cardiology was consulted and recommended inpatient treatment and further work-up. He refused at that time due to work responsibilities and signed out AMA. We arrange outpatient follow-up and work-up. [facility] for a follow-up visit with Dr. Okey DupreEnd after  high risk stress test results earlier that day.  Hospital Course     Consultants: Cardiothoracic Surgery  He was taken to the cath lab by Dr. Eldridge DaceVaranasi same day as the high risk stress test and his outpatient appointment with Dr. 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 Left dominant system. EF 55-65 %. Recommendation was for cardiac surgery consult. Dr. Barry Dieneswens with cardiothoracic surgery saw patient today and agrees that coronary artery bypass grafting is the best option for long term treatment. Dr. Cornelius Moraswen emphasized the risk of longterm risk modification. The surgery has been planned for Thursday 07/26/2016. Dr. Excell Seltzerooper and Dr. Cornelius Moraswen feel that it is safe for patient to go home in the interim so long as an rx for SL nitro is prescribed and used as needed. Mr. Manson PasseyBrown has been educated on s/sx to be concerned about that would warrant emergency return.  Increased Lipitor 10 mg to  40 mg  The radial radial catheter site is stable All follow-up appointments have been scheduled.  A work excuse note was provided as well. Discharge  medications are listed below.  _____________  Discharge Vitals Blood pressure 125/80, pulse 78, temperature 98.7 F (37.1 C), temperature source Oral, resp. rate 18, height 5\' 9"  (1.753 m), weight 175 lb (79.4 kg), SpO2 99 %.  Filed Weights   07/19/16 1200 07/19/16 2043  Weight: 175 lb (79.4 kg) 175 lb (79.4 kg)    Labs & Radiologic Studies     CBC  Recent Labs  07/19/16 2038 07/20/16 1225  WBC 8.3 7.7  HGB 14.7 15.4  HCT 41.7 45.1  MCV 92.3 93.8  PLT 316 350   Basic Metabolic Panel  Recent Labs  07/19/16 2038  CREATININE 0.73    Dg Chest 2 View  Result Date: 07/16/2016 CLINICAL DATA:  Chest pain for 2-3 weeks. EXAM: CHEST  2 VIEW COMPARISON:  None. FINDINGS: The heart size and mediastinal contours are within normal limits. Both lungs are clear. The visualized skeletal structures are unremarkable. IMPRESSION: Normal chest x-ray. Electronically Signed   By: Rudie MeyerP.  Gallerani M.D.   On: 07/16/2016 11:20   Ct Angio Chest Pe W Or Wo Contrast  Result Date: 07/16/2016 CLINICAL DATA:  Back pain for 1 week. EXAM: CT ANGIOGRAPHY CHEST WITH CONTRAST TECHNIQUE: Multidetector CT imaging of the chest was performed using the standard protocol during bolus administration of intravenous contrast. Multiplanar CT image reconstructions and MIPs were obtained to evaluate the vascular anatomy. CONTRAST:  100 cc Isovue 370 COMPARISON:  07/16/2016 radiographs FINDINGS: Cardiovascular: Timing on today'  s exam was optimized for assessment of the pulmonary arteries. No filling defect is identified in the pulmonary arterial tree to suggest pulmonary embolus. There is adequate contrast in the systemic arterial supply to assess the aorta, and no dissection is observed. No compelling findings of acute aortic injury. There does appear to be coronary artery atherosclerosis involving the left anterior descending and circumflex coronary arteries. This seems advanced for age, and potentially disproportionate to  atherosclerosis in the aorta. Mediastinum/Nodes: There is potentially some mild wall thickening in the distal esophagus, a likely cause would be esophagitis. Lungs/Pleura: Unremarkable Upper Abdomen: Unremarkable Musculoskeletal: Unremarkable Review of the MIP images confirms the above findings. IMPRESSION: 1. No filling defect is identified in the pulmonary arterial tree to suggest pulmonary embolus. No acute aortic findings are identified. 2. Potential mild wall thickening in the distal esophagus, esophagitis not excluded. Electronically Signed   By: Gaylyn Rong M.D.   On: 07/16/2016 17:35     Diagnostic Studies/Procedures    Left Heart Cath and Coronary Angiography  Conclusion  07/19/2016    Ost 1st Diag to 1st Diag lesion, 90 %stenosed.  Prox LAD lesion, 90 %stenosed.  Ost 1st Mrg to 1st Mrg lesion, 80 %stenosed.  Mid RCA lesion, 80 %stenosed.  LPDA lesion, 100 %stenosed. Left to left collaterals, right to left collaterals.  Mid LAD lesion, 80 %stenosed.  Ost 2nd Diag lesion, 90 %stenosed.  The left ventricular systolic function is normal.  LV end diastolic pressure is normal.  The left ventricular ejection fraction is 55-65% by visual estimate.  There is no aortic valve stenosis.   Severe native three vessel disease.  Left dominant system.  Will obtain cardiac surgery consult in AM.    _____________   Disposition   Pt is being discharged home today in good condition.  Follow-up Plans & Appointments    Follow-up Information    [facility] Follow up on 07/26/2016.   Why:  07/26/2016 is the date scheduled for your surgery Contact information: [address] [place] [address] [phone]           Discharge Medications   Allergies as of 07/20/2016   No Known Allergies     Medication List    TAKE these medications   aspirin 81 MG chewable tablet Chew 1 tablet (81 mg total) by mouth daily.     atorvastatin 40 MG tablet Commonly known as:  LIPITOR Take 1 tablet (40 mg total) by mouth daily. What changed:  medication strength  how much to take   glimepiride 1 MG tablet Commonly known as:  AMARYL Take 1 mg by mouth daily with breakfast.   insulin glargine 100 UNIT/ML injection Commonly known as:  LANTUS Inject 50 Units into the skin at bedtime.   lisinopril 5 MG tablet Commonly known as:  PRINIVIL,ZESTRIL Take 5 mg by mouth daily.   metFORMIN 1000 MG tablet Commonly known as:  GLUCOPHAGE Take 1,000 mg by mouth 2 (two) times daily with a meal.   metoprolol tartrate 25 MG tablet Commonly known as:  LOPRESSOR Take 1 tablet (25 mg total) by mouth 2 (two) times daily.   nitroGLYCERIN 0.4 MG SL tablet Commonly known as:  NITROSTAT Place 1 tablet (0.4 mg total) under the tongue every 5 (five) minutes as needed for chest pain.   rOPINIRole 0.5 MG tablet Commonly known as:  REQUIP Take 0.25 mg by mouth at bedtime.   sertraline 100 MG tablet Commonly known as:  ZOLOFT  Take 100 mg by mouth daily.        Aspirin prescribed at discharge?  Yes High Intensity Statin Prescribed? (Lipitor 40-80mg  or Crestor 20-40mg ): Yes Beta Blocker Prescribed? Yes For EF 45% or less, Was ACEI/ARB Prescribed? Yes ADP Receptor Inhibitor Prescribed? (i.e. Plavix etc.-Includes Medically Managed Patients): Yes For EF <45%, Aldosterone Inhibitor Prescribed? Yes Was EF assessed during THIS hospitalization? Yes Was Cardiac Rehab II ordered? (Included Medically managed Patients): No: pt having CABG next week   Outstanding Labs/Studies   Surgery scheduled for 5/17  Duration of Discharge Encounter   Greater than 30 minutes including physician time.  Signed, Dorthula Matas PA-C 07/20/2016, 1:08 PM

## 2016-07-20 NOTE — Progress Notes (Signed)
Discharged to home with family office visits in place teaching done  

## 2016-07-20 NOTE — Progress Notes (Signed)
Pre-op Cardiac Surgery  Carotid Findings:  Mild plaque in bilateral carotid arteries without significant stenosis.   Upper Extremity Right Left  Brachial Pressures 133 134  Radial Waveforms Triphasic Triphasic  Ulnar Waveforms Triphasic Triphasic  Palmar Arch (Allen's Test) WNL WNL    Lower  Extremity Right Left  Dorsalis Pedis 130 142  Posterior Tibial 147 149  Ankle/Brachial Indices 1.1 1.1    Findings: Normal at rest.  [name] [name], BS, RDMS, RVT

## 2016-07-20 NOTE — Consult Note (Signed)
[address]       [name] [id number]             [phone]          CARDIOTHORACIC SURGERY CONSULTATION REPORT  PCP is [name], [name] I, MD Referring Provider is [name], [name], MD Primary Cardiologist is [name], [name] G, MD (new)   Reason for consultation:  Severe 3-vessel CAD with new onset angina pectoris  HPI:  Patient is a 46 year old white male with no previous history of coronary artery disease but risk factors notable for history of hypertension, poorly controlled insulin-dependent type 2 diabetes mellitus, hyperlipidemia, and a family history of coronary artery disease who has been referred for surgical consultation to discuss treatment options for management of severe multivessel coronary artery disease with new onset angina pectoris.  The patient states he was in his usual state of health until 2 or 3 weeks ago when he first began to notice symptoms of upper back pain and epigastric pain brought on with physical exertion and relieved by rest. He noticed this initially when he was exercising on a treadmill. Every time he would stop the symptoms would resolve. Symptoms increased in severity, prompting him to present to to the emergency department 07/16/2016. Initially ECG revealed sinus tachycardia without acute changes. Troponins were negative. CT angiogram revealed no evidence of pulmonary embolism or aortic dissection but was notable for the presence of significant coronary calcification. The patient was admitted to the hospital and ruled out for myocardial infarction. He underwent diagnostic cardiac catheterization demonstrating severe three-vessel coronary artery disease with normal left ventricular systolic function. Cardiothoracic surgical consultation was requested.  The patient is married and lives in [place] with his wife. He works for [employer] which requires carrying a backpack. He is very active on the job and exercises regularly. He  has been in his usual state of health until very recently when he developed exertional pain in the upper mid back which radiates to the epigastric region. Symptoms are always relieved by rest. He denies nocturnal chest pain or shortness of breath. He has not experienced any exertional shortness of breath, PND, orthopnea, or lower extremity edema. He denies any dizzy spells, palpitations, or syncope.   Past Medical History:  Diagnosis Date  . Anxiety   . Coronary artery calcification    a. noted on CT angio 07/16/16  . High cholesterol   . HTN (hypertension)   . OSA on CPAP    "don't wear it that much" (07/19/2016)  . Type 2 diabetes mellitus (HCC)     Past Surgical History:  Procedure Laterality Date  . CARDIAC CATHETERIZATION  07/19/2016  . LEFT HEART CATH AND CORONARY ANGIOGRAPHY N/A 07/19/2016   Procedure: Left Heart Cath and Coronary Angiography;  Surgeon: Corky CraftsVaranasi, Jayadeep S, MD;  [facility];  Service: Cardiovascular;  Laterality: N/A;  . ORCHIOPEXY Right ~ 1980   "undescending testicle"    Family History  Problem Relation Age of Onset  . Heart failure Mother   . CAD Paternal Grandfather   . Heart attack Paternal Grandfather     Social History   Social History  . Marital status: Married    Spouse name: N/A  . Number of children: N/A  . Years of education: N/A   Occupational History  . Not on file.   Social History Main Topics  . Smoking status: Never Smoker  . Smokeless tobacco: Never Used  . Alcohol use  No  . Drug use: No  . Sexual activity: Yes   Other Topics Concern  . Not on file   Social History Narrative  . No narrative on file    Prior to Admission medications   Medication Sig Start Date End Date Taking? Authorizing Provider  aspirin 81 MG chewable tablet Chew 1 tablet (81 mg total) by mouth daily. 07/16/16  Yes Arby Barrette, MD  atorvastatin (LIPITOR) 10 MG tablet Take 10 mg by mouth daily.   Yes [provider]    glimepiride (AMARYL) 1 MG tablet Take 1 mg by mouth daily with breakfast.   Yes [provider]  insulin glargine (LANTUS) 100 UNIT/ML injection Inject 50 Units into the skin at bedtime.   Yes [provider]  lisinopril (PRINIVIL,ZESTRIL) 5 MG tablet Take 5 mg by mouth daily.   Yes [provider]  metFORMIN (GLUCOPHAGE) 1000 MG tablet Take 1,000 mg by mouth 2 (two) times daily with a meal.   Yes [provider]  rOPINIRole (REQUIP) 0.5 MG tablet Take 0.25 mg by mouth at bedtime.   Yes [provider]  sertraline (ZOLOFT) 100 MG tablet Take 100 mg by mouth daily.   Yes [provider]    Current Facility-Administered Medications  Medication Dose Route Frequency Provider Last Rate Last Dose  . 0.9 %  sodium chloride infusion  250 mL Intravenous PRN Corky Crafts, MD      . acetaminophen (TYLENOL) tablet 650 mg  650 mg Oral Q4H PRN Corky Crafts, MD      . aspirin chewable tablet 81 mg  81 mg Oral Daily Corky Crafts, MD      . atorvastatin (LIPITOR) tablet 40 mg  40 mg Oral q1800 Tonny Bollman, MD      . glimepiride (AMARYL) tablet 1 mg  1 mg Oral Q breakfast Corky Crafts, MD   1 mg at 07/20/16 0800  . heparin injection 5,000 Units  5,000 Units Subcutaneous Q8H Corky Crafts, MD   5,000 Units at 07/20/16 [phone]  . insulin aspart (novoLOG) injection 0-15 Units  0-15 Units Subcutaneous TID WC Corky Crafts, MD      . insulin glargine (LANTUS) injection 50 Units  50 Units Subcutaneous QHS Corky Crafts, MD   50 Units at 07/19/16 2155  . lisinopril (PRINIVIL,ZESTRIL) tablet 5 mg  5 mg Oral Daily Corky Crafts, MD   5 mg at 07/19/16 2045  . metoprolol tartrate (LOPRESSOR) tablet 25 mg  25 mg Oral BID Tonny Bollman, MD      . ondansetron [facility]) injection 4 mg  4 mg Intravenous Q6H PRN Corky Crafts, MD      . rOPINIRole (REQUIP) tablet 0.25 mg  0.25 mg Oral QHS Corky Crafts, MD   0.25 mg at 07/19/16 2155  . sertraline (ZOLOFT) tablet 100 mg  100 mg Oral Daily Corky Crafts, MD   100 mg at 07/19/16 2045  . sodium chloride flush (NS) 0.9 % injection 3 mL  3 mL Intravenous Q12H Lance Muss S, MD      . sodium chloride flush (NS) 0.9 % injection 3 mL  3 mL Intravenous PRN Corky Crafts, MD        No Known Allergies    Review of Systems:   General:  normal appetite, normal energy, no weight gain, no weight loss, no fever  Cardiac:  + chest pain with exertion, no chest pain at rest,  no SOB with exertion, no resting SOB, no PND, no orthopnea, no palpitations, no arrhythmia, no atrial fibrillation, no LE edema, no dizzy spells, no syncope  Respiratory:  no shortness of breath, no home oxygen, no productive cough, no dry cough, no bronchitis, no wheezing, no hemoptysis, no asthma, no pain with inspiration or cough, no sleep apnea, no CPAP at night  GI:   no difficulty swallowing, no reflux, no frequent heartburn, no hiatal hernia, no abdominal pain, no constipation, no diarrhea, no hematochezia, no hematemesis, no melena  GU:   no dysuria,  no frequency, no urinary tract infection, no hematuria, no enlarged prostate, no kidney stones, no kidney disease  Vascular:  no pain suggestive of claudication, no pain in feet, no leg cramps, no varicose veins, no DVT, no non-healing foot ulcer  Neuro:   no stroke, no TIA's, no seizures, no headaches, no temporary blindness one eye,  no slurred speech, no peripheral neuropathy, no chronic pain, no instability of gait, no memory/cognitive dysfunction  Musculoskeletal: no arthritis , no joint swelling, no myalgias, no difficulty walking, normal mobility   Skin:   no rash, no itching, no skin infections, no pressure sores or ulcerations  Psych:   + anxiety, + depression, no nervousness, no unusual recent stress  Eyes:   no blurry vision, no floaters, no recent vision changes, + wears glasses for reading  only  ENT:   no hearing loss, no loose or painful teeth, no dentures, last saw dentist 1 year ago  Hematologic:  no easy bruising, no abnormal bleeding, no clotting disorder, no frequent epistaxis  Endocrine:  + diabetes, does check CBG's at home, most recent Hgb a1c > 8     Physical Exam:   BP 125/80 (BP Location: Left Arm)   Pulse 78   Temp 98.7 F (37.1 C) (Oral)   Resp 18   Ht 5\' 9"  (1.753 m)   Wt 175 lb (79.4 kg)   SpO2 99%   BMI 25.84 kg/m   General:    well-appearing  HEENT:  Unremarkable   Neck:   no JVD, no bruits, no adenopathy   Chest:   clear to auscultation, symmetrical breath sounds, no wheezes, no rhonchi   CV:   RRR, no  murmur   Abdomen:  soft, non-tender, no masses   Extremities:  warm, well-perfused, pulses palpable, no lower extremity edema  Rectal/GU  Deferred  Neuro:   Grossly non-focal and symmetrical throughout  Skin:   Clean and dry, no rashes, no breakdown  Diagnostic Tests:  Left Heart Cath and Coronary Angiography  Conclusion     Ost 1st Diag to 1st Diag lesion, 90 %stenosed.  Prox LAD lesion, 90 %stenosed.  Ost 1st Mrg to 1st Mrg lesion, 80 %stenosed.  Mid RCA lesion, 80 %stenosed.  LPDA lesion, 100 %stenosed. Left to left collaterals, right to left collaterals.  Mid LAD lesion, 80 %stenosed.  Ost 2nd Diag lesion, 90 %stenosed.  The left ventricular systolic function is normal.  LV end diastolic pressure is normal.  The left ventricular ejection fraction is 55-65% by visual estimate.  There is no aortic valve stenosis.   Severe native three vessel disease.  Left dominant system.  Will obtain cardiac surgery consult in AM.   Indications   Abnormal stress test [R94.39 (ICD-10-CM)]  Unstable angina (HCC) [I20.0 (ICD-10-CM)]  Procedural Details/Technique   Technical Details The risks, benefits, and details of the procedure were explained to the patient. The patient verbalized understanding and wanted  to proceed. Informed  written consent was obtained.  PROCEDURE TECHNIQUE: After Xylocaine anesthesia a 84F slender sheath was placed in the right radial artery with a single anterior needle wall stick. IV Heparin was given. Left ventriculography was done using a JR4 catheter. Right coronary angiography was done using a Judkins R4 guide catheter. Left coronary angiography was done using a Judkins L3.5 guide catheter. A TR band was used for hemostasis.  Contrast: 60 cc    Estimated blood loss <50 mL.  During this procedure the patient was administered the following to achieve and maintain moderate conscious sedation: Versed 2 mg, Fentanyl 25 mcg, while the patient's heart rate, blood pressure, and oxygen saturation were continuously monitored. The period of conscious sedation was 29 minutes, of which [name] was present face-to-face 100% of this time.    Coronary Findings   Dominance: Left  Left Anterior Descending  Prox LAD lesion, 90% stenosed.  Mid LAD lesion, 80% stenosed.  First Diagonal Branch  Vessel is small in size.  Ost 1st Diag to 1st Diag lesion, 90% stenosed.  Lateral First Diagonal Branch  Vessel is small in size.  Second Diagonal [employer] 2nd Diag lesion, 90% stenosed.  Left Circumflex  First Obtuse Marginal Branch  Ost 1st Mrg to 1st Mrg lesion, 80% stenosed.  Left Posterior Descending Artery  LPDA filled by collaterals from Dist RCA. LPDA filled by collaterals from Dist LAD.  LPDA lesion, 100% stenosed.  Right Coronary Artery  Vessel is small.  Mid RCA lesion, 80% stenosed.  Wall Motion   Resting               Left Heart   Left Ventricle The left ventricular size is normal. The left ventricular systolic function is normal. LV end diastolic pressure is normal. The left ventricular ejection fraction is 55-65% by visual estimate.    Aortic Valve There is no aortic valve stenosis.    Coronary Diagrams   Diagnostic Diagram       Implants     No implant documentation for this  case.  PACS Images   Show images for Cardiac catheterization   Link to Procedure Log   Procedure Log    Hemo Data    Most Recent Value  AO Systolic Pressure 115 mmHg  AO Diastolic Pressure 71 mmHg  AO Mean 91 mmHg  LV Systolic Pressure 129 mmHg  LV Diastolic Pressure 0 mmHg  LV EDP 5 mmHg  Arterial Occlusion Pressure Extended Systolic Pressure 123 mmHg  Arterial Occlusion Pressure Extended Diastolic Pressure 78 mmHg  Arterial Occlusion Pressure Extended Mean Pressure 99 mmHg  Left Ventricular Apex Extended Systolic Pressure 128 mmHg  Left Ventricular Apex Extended Diastolic Pressure 3 mmHg  Left Ventricular Apex Extended EDP Pressure 7 mmHg      Impression:  Patient has severe multivessel coronary artery disease but preserved left ventricular systolic function and presents with new-onset symptoms of exertional pain in the upper mid back radiating to the epigastric region consistent with angina pectoris.  Symptoms are always brought on with physical exertion and probably relieved by rest. [name] have personally reviewed the patient's diagnostic catheterization which demonstrates severe diffuse coronary artery disease consistent with poorly controlled long-standing diabetes mellitus. There is high-grade proximal stenosis involving all 3 major vascular territories. There are relatively poor target vessels for grafting, but the patient does appear to have adequate targets to facilitate surgical revascularization. [name] agree that coronary artery bypass grafting is the best option for long-term management.  Plan:  [name] have reviewed the indications, risks, and potential benefits of coronary artery bypass grafting with the patient and his wife.  Alternative treatment strategies have been discussed, including the relative risks, benefits and long term prognosis associated with medical therapy, percutaneous coronary intervention, and surgical revascularization.  The patient understands and accepts all  potential associated risks of surgery including but not limited to risk of death, stroke or other neurologic complication, myocardial infarction, congestive heart failure, respiratory failure, renal failure, bleeding requiring blood transfusion and/or reexploration, aortic dissection or other major vascular complication, arrhythmia, heart block or bradycardia requiring permanent pacemaker, pneumonia, pleural effusion, wound infection, pulmonary embolus or other thromboembolic complication, chronic pain or other delayed complications related to median sternotomy, or the late recurrence of symptomatic ischemic heart disease and/or congestive heart failure.  The importance of long term risk modification have been emphasized.  All questions answered.  We plan to proceed with surgery on Thursday, 07/26/2016.  [name] agree with plans to let the patient go home during the interim period of time as long as he receives a prescription for sublingual nitroglycerin to be utilized as needed. The patient has been educated regarding symptoms to be concerned about. All questions have been addressed.   [name] spent in excess of 120 minutes during the conduct of this hospital consultation and >50% of this time involved direct face-to-face encounter for counseling and/or coordination of the patient's care.    Salvatore Decent. Cornelius Moras, MD 07/20/2016 9:39 AM

## 2016-07-20 NOTE — Progress Notes (Signed)
Discussed with pt and wife sternal precautions, IS, mobility, and d/c planning. Voiced understanding. Gave materials to read. Video system is not working today. Also discussed NTG. Wife is Charity fundraiserN.  [phone] Ethelda ChickKristan Dhruti Ghuman CES, ACSM 1:37 PM 07/20/2016

## 2016-07-20 NOTE — Progress Notes (Signed)
Progress Note  Patient Name: [name] [name] Date of Encounter: 07/20/2016  Primary Cardiologist: End  Subjective   Feels well no chest pain or dyspnea.   Inpatient Medications    Scheduled Meds: . aspirin  81 mg Oral Daily  . atorvastatin  10 mg Oral Daily  . glimepiride  1 mg Oral Q breakfast  . heparin  5,000 Units Subcutaneous Q8H  . insulin aspart  0-15 Units Subcutaneous TID WC  . insulin glargine  50 Units Subcutaneous QHS  . lisinopril  5 mg Oral Daily  . rOPINIRole  0.25 mg Oral QHS  . sertraline  100 mg Oral Daily  . sodium chloride flush  3 mL Intravenous Q12H   Continuous Infusions: . sodium chloride     PRN Meds: sodium chloride, acetaminophen, ondansetron (ZOFRAN) IV, sodium chloride flush   Vital Signs    Vitals:   07/20/16 0015 07/20/16 0045 07/20/16 0115 07/20/16 0434  BP: 120/76 123/76 122/71 125/80  Pulse: 82 80 81 78  Resp: 18 18 18 18   Temp: 98.1 F (36.7 C)   98.7 F (37.1 C)  TempSrc: Oral   Oral  SpO2: 98% 98% 96% 99%  Weight:      Height:        Intake/Output Summary (Last 24 hours) at 07/20/16 0725 Last data filed at 07/20/16 0540  Gross per 24 hour  Intake                0 ml  Output              300 ml  Net             -300 ml   Filed Weights   07/19/16 1200 07/19/16 2043  Weight: 175 lb (79.4 kg) 175 lb (79.4 kg)    Telemetry    NSR no arrhythmia - Personally Reviewed   Physical Exam  GEN: No acute distress.   Neck: No JVD Cardiac: RRR, no murmurs, rubs, or gallops.  Respiratory: Clear to auscultation bilaterally. GI: Soft, nontender, non-distended  MS: No edema; No deformity. Right radial site clear Neuro:  Nonfocal  Psych: Normal affect   Labs    Chemistry Recent Labs Lab 07/16/16 1045 07/19/16 2038  NA 134*  --   K 4.7  --   CL 100*  --   CO2 23  --   GLUCOSE 276*  --   BUN 17  --   CREATININE 0.79 0.73  CALCIUM 9.1  --   GFRNONAA >60 >60  GFRAA >60 >60  ANIONGAP 11  --       Hematology Recent Labs Lab 07/16/16 1045 07/19/16 2038  WBC 9.4 8.3  RBC 4.78 4.52  HGB 15.5 14.7  HCT 45.1 41.7  MCV 94.4 92.3  MCH 32.4 32.5  MCHC 34.4 35.3  RDW 12.1 12.1  PLT 336 316    Cardiac EnzymesNo results for input(s): TROPONINI in the last 168 hours.  Recent Labs Lab 07/16/16 1111 07/16/16 1906  TROPIPOC 0.01 0.00     BNPNo results for input(s): BNP, PROBNP in the last 168 hours.   DDimer No results for input(s): DDIMER in the last 168 hours.   Radiology    No results found.  Cardiac Studies   Cardiac Cath: Conclusion     Ost 1st Diag to 1st Diag lesion, 90 %stenosed.  Prox LAD lesion, 90 %stenosed.  Ost 1st Mrg to 1st Mrg lesion, 80 %stenosed.  Mid RCA lesion, 80 %stenosed.  LPDA lesion, 100 %stenosed. Left to left collaterals, right to left collaterals.  Mid LAD lesion, 80 %stenosed.  Ost 2nd Diag lesion, 90 %stenosed.  The left ventricular systolic function is normal.  LV end diastolic pressure is normal.  The left ventricular ejection fraction is 55-65% by visual estimate.  There is no aortic valve stenosis.   Severe native three vessel disease.  Left dominant system.  Will obtain cardiac surgery consult in AM.    Patient Profile     46 y.o. male admitted 5/9 after high-risk stress test and symptoms of crescendo angina  Assessment & Plan    1. Unstable angina with severe multivessel CAD: plans for TCTS consult for CABG. Lengthy discussion with patient this am. He has not had rest pain. I reviewed his films and agree that CABG is his only revascularization option. Unfortunately he appears to have poor target vessel with diffuse diabetic-pattern CAD. Continue ASA, ACE. Add beta-blcoker (metoprolol 25 mg BID). Work out logistics of surgical consult with TCTS team. Likely home today to return for CABG if felt to be a candidate.   2. Type II DM: treated with insulin and oral hypoglycemics  3. Hyperlipidemia: change  atorvastatin to high-intensity dose. Hx of elevated LFT's - will follow.   Enzo BiSigned, Cedric Mcclaine, MD  07/20/2016, 7:25 AM

## 2016-07-20 NOTE — Progress Notes (Signed)
TCTS BRIEF PROGRESS NOTE   Patient seen and examined, chart and cath reviewed.  We tentatively plan CABG on Thursday 5/17.  Routine pre-op labs and carotid duplex scan ordered.  Okay for patient to be discharged home once they have been completed.  Full note to follow.  Purcell Nailslarence H Owen, MD 07/20/2016 9:41 AM

## 2016-07-21 LAB — HEMOGLOBIN A1C
Hgb A1c MFr Bld: 8.2 % — ABNORMAL HIGH (ref 4.8–5.6)
Mean Plasma Glucose: 189 mg/dL

## 2016-07-25 ENCOUNTER — Encounter (HOSPITAL_COMMUNITY): Payer: Self-pay | Admitting: *Deleted

## 2016-07-25 DIAGNOSIS — Z736 Limitation of activities due to disability: Secondary | ICD-10-CM

## 2016-07-25 MED ORDER — VANCOMYCIN HCL 10 G IV SOLR
1250.0000 mg | INTRAVENOUS | Status: AC
  Administered 2016-07-26: 1250 mg via INTRAVENOUS
  Filled 2016-07-25 (×2): qty 1250

## 2016-07-25 MED ORDER — SODIUM CHLORIDE 0.9 % IV SOLN
1.5000 mg/kg/h | INTRAVENOUS | Status: AC
  Administered 2016-07-26: 1.5 mg/kg/h via INTRAVENOUS
  Filled 2016-07-25: qty 25

## 2016-07-25 MED ORDER — DEXTROSE 5 % IV SOLN
750.0000 mg | INTRAVENOUS | Status: DC
  Filled 2016-07-25: qty 750

## 2016-07-25 MED ORDER — METOPROLOL TARTRATE 12.5 MG HALF TABLET: 12.5000 mg | ORAL_TABLET | Freq: Once | ORAL | Status: DC

## 2016-07-25 MED ORDER — LIDOCAINE HCL (CARDIAC) 20 MG/ML IV SOLN
260.0000 mg | INTRAVENOUS | Status: DC
  Filled 2016-07-25 (×2): qty 13
  Filled 2016-07-25 (×2): qty 15

## 2016-07-25 MED ORDER — SODIUM CHLORIDE 0.9 % IV SOLN
30.0000 ug/min | INTRAVENOUS | Status: AC
  Administered 2016-07-26: 20 ug/min via INTRAVENOUS
  Administered 2016-07-26: 1 ug via INTRAVENOUS
  Filled 2016-07-25: qty 2

## 2016-07-25 MED ORDER — TRANEXAMIC ACID (OHS) PUMP PRIME SOLUTION
2.0000 mg/kg | INTRAVENOUS | Status: DC
  Filled 2016-07-25: qty 1.59

## 2016-07-25 MED ORDER — PLASMA-LYTE 148 IV SOLN
INTRAVENOUS | Status: AC
  Administered 2016-07-26: 500 mL
  Filled 2016-07-25: qty 2.5

## 2016-07-25 MED ORDER — DEXTROSE 5 % IV SOLN
0.0000 ug/min | INTRAVENOUS | Status: DC
  Filled 2016-07-25: qty 4

## 2016-07-25 MED ORDER — MAGNESIUM SULFATE 50 % IJ SOLN
40.0000 meq | INTRAMUSCULAR | Status: DC
Start: 2016-07-26 — End: 2016-07-26
  Filled 2016-07-25: qty 10

## 2016-07-25 MED ORDER — DEXTROSE 5 % IV SOLN
1.5000 g | INTRAVENOUS | Status: AC
  Administered 2016-07-26: .75 g via INTRAVENOUS
  Administered 2016-07-26: 1.5 g via INTRAVENOUS
  Filled 2016-07-25 (×2): qty 1.5

## 2016-07-25 MED ORDER — DEXMEDETOMIDINE HCL IN NACL 400 MCG/100ML IV SOLN
0.1000 ug/kg/h | INTRAVENOUS | Status: AC
  Administered 2016-07-26: .3 ug/kg/h via INTRAVENOUS
  Filled 2016-07-25: qty 100

## 2016-07-25 MED ORDER — CHLORHEXIDINE GLUCONATE 0.12 % MT SOLN
15.0000 mL | Freq: Once | OROMUCOSAL | Status: AC
  Administered 2016-07-26: 15 mL via OROMUCOSAL
  Filled 2016-07-25: qty 15

## 2016-07-25 MED ORDER — VANCOMYCIN HCL 1000 MG IV SOLR
INTRAVENOUS | Status: AC
  Administered 2016-07-26: 1000 mL
  Filled 2016-07-25: qty 1000

## 2016-07-25 MED ORDER — NITROGLYCERIN IN D5W 200-5 MCG/ML-% IV SOLN
2.0000 ug/min | INTRAVENOUS | Status: AC
  Administered 2016-07-26: 5 ug/min via INTRAVENOUS
  Filled 2016-07-25: qty 250

## 2016-07-25 MED ORDER — TRANEXAMIC ACID (OHS) BOLUS VIA INFUSION
15.0000 mg/kg | INTRAVENOUS | Status: AC
  Administered 2016-07-26: 15 mg via INTRAVENOUS
  Filled 2016-07-25: qty 1191

## 2016-07-25 MED ORDER — SODIUM CHLORIDE 0.9 % IV SOLN
INTRAVENOUS | Status: DC
  Filled 2016-07-25: qty 30

## 2016-07-25 MED ORDER — SODIUM CHLORIDE 0.9 % IV SOLN
INTRAVENOUS | Status: AC
  Administered 2016-07-26: 3.6 [IU]/h via INTRAVENOUS
  Filled 2016-07-25 (×2): qty 1

## 2016-07-25 MED ORDER — POTASSIUM CHLORIDE 2 MEQ/ML IV SOLN
80.0000 meq | INTRAVENOUS | Status: DC
  Filled 2016-07-25: qty 40

## 2016-07-25 MED ORDER — DOPAMINE-DEXTROSE 3.2-5 MG/ML-% IV SOLN
0.0000 ug/kg/min | INTRAVENOUS | Status: DC
  Filled 2016-07-25: qty 250

## 2016-07-25 MED ORDER — MANNITOL 20 % IV SOLN
6.4000 g | INTRAVENOUS | Status: DC
  Filled 2016-07-25 (×4): qty 250

## 2016-07-25 NOTE — Anesthesia Preprocedure Evaluation (Addendum)
Anesthesia Evaluation  Patient identified by MRN, date of birth, ID band Patient awake    Reviewed: Allergy & Precautions, NPO status , Patient's Chart, lab work & pertinent test results  History of Anesthesia Complications Negative for: history of anesthetic complications  Airway Mallampati: II  TM Distance: >3 FB Neck ROM: Full    Dental no notable dental hx. (+) Dental Advisory Given   Pulmonary neg pulmonary ROS, sleep apnea and Continuous Positive Airway Pressure Ventilation ,    Pulmonary exam normal        Cardiovascular hypertension, + angina + CAD  Normal cardiovascular exam     Neuro/Psych Anxiety negative neurological ROS  negative psych ROS   GI/Hepatic negative GI ROS, Neg liver ROS,   Endo/Other  diabetes  Renal/GU negative Renal ROS     Musculoskeletal negative musculoskeletal ROS (+)   Abdominal   Peds  Hematology negative hematology ROS (+)   Anesthesia Other Findings Day of surgery medications reviewed with the patient.  Reproductive/Obstetrics                            Anesthesia Physical Anesthesia Plan  ASA: IV  Anesthesia Plan: General   Post-op Pain Management:    Induction: Intravenous  Airway Management Planned: Oral ETT  Additional Equipment: Arterial line, 3D TEE, PA Cath and Ultrasound Guidance Line Placement  Intra-op Plan:   Post-operative Plan: Post-operative intubation/ventilation  Informed Consent: I have reviewed the patients History and Physical, chart, labs and discussed the procedure including the risks, benefits and alternatives for the proposed anesthesia with the patient or authorized representative who has indicated his/her understanding and acceptance.   Dental advisory given  Plan Discussed with: CRNA, Anesthesiologist and Surgeon  Anesthesia Plan Comments:        Anesthesia Quick Evaluation

## 2016-07-25 NOTE — Progress Notes (Signed)
Pt denies any acute cardiopulmonary issues. Pt denies having an echo. Pt made aware to stop taking vitamins, fish oil and herbal medications. Do not take any NSAIDs ie: Ibuprofen, Advil, Naproxen, BC and Goody Powder. Pt made aware to take half dose of Lantus tonight (25 units), do not take diabetes pills DOS ( Glimepiride, Metformin), check BG every 2 hours tomorrow prior to arrival to hospital, if BG < 70 drink 4 ounces of apple or cranberry juice, wait 15 minutes after drinking juice and recheck BG, if BG remains < 70, call SS unit. Pt verbalized understanding of all pre-op instructions.

## 2016-07-26 ENCOUNTER — Inpatient Hospital Stay (HOSPITAL_COMMUNITY): Payer: 59 | Admitting: Anesthesiology

## 2016-07-26 ENCOUNTER — Encounter (HOSPITAL_COMMUNITY): Payer: Self-pay | Admitting: *Deleted

## 2016-07-26 ENCOUNTER — Ambulatory Visit (HOSPITAL_COMMUNITY): Payer: 59 | Attending: Thoracic Surgery (Cardiothoracic Vascular Surgery)

## 2016-07-26 ENCOUNTER — Inpatient Hospital Stay (HOSPITAL_COMMUNITY)
Admission: RE | Admit: 2016-07-26 | Discharge: 2016-07-31 | DRG: 236 | Disposition: A | Discharge status: Home / Self Care | Payer: 59 | Source: Ambulatory Visit | Attending: Thoracic Surgery (Cardiothoracic Vascular Surgery) | Admitting: Thoracic Surgery (Cardiothoracic Vascular Surgery)

## 2016-07-26 ENCOUNTER — Inpatient Hospital Stay (HOSPITAL_COMMUNITY)
Admission: RE | Disposition: A | Discharge status: Home / Self Care | Payer: Self-pay | Source: Ambulatory Visit | Attending: Thoracic Surgery (Cardiothoracic Vascular Surgery)

## 2016-07-26 ENCOUNTER — Inpatient Hospital Stay (HOSPITAL_COMMUNITY): Payer: 59

## 2016-07-26 ENCOUNTER — Ambulatory Visit: Payer: PRIVATE HEALTH INSURANCE | Admitting: Physician Assistant

## 2016-07-26 DIAGNOSIS — J9811 Atelectasis: Secondary | ICD-10-CM

## 2016-07-26 DIAGNOSIS — I340 Nonrheumatic mitral (valve) insufficiency: Secondary | ICD-10-CM | POA: Insufficient documentation

## 2016-07-26 DIAGNOSIS — E1165 Type 2 diabetes mellitus with hyperglycemia: Secondary | ICD-10-CM | POA: Diagnosis present

## 2016-07-26 DIAGNOSIS — Z794 Long term (current) use of insulin: Secondary | ICD-10-CM

## 2016-07-26 DIAGNOSIS — E1159 Type 2 diabetes mellitus with other circulatory complications: Secondary | ICD-10-CM

## 2016-07-26 DIAGNOSIS — I25110 Atherosclerotic heart disease of native coronary artery with unstable angina pectoris: Secondary | ICD-10-CM | POA: Diagnosis present

## 2016-07-26 DIAGNOSIS — Z7982 Long term (current) use of aspirin: Secondary | ICD-10-CM

## 2016-07-26 DIAGNOSIS — F419 Anxiety disorder, unspecified: Secondary | ICD-10-CM | POA: Diagnosis present

## 2016-07-26 DIAGNOSIS — I371 Nonrheumatic pulmonary valve insufficiency: Secondary | ICD-10-CM | POA: Insufficient documentation

## 2016-07-26 DIAGNOSIS — Z8249 Family history of ischemic heart disease and other diseases of the circulatory system: Secondary | ICD-10-CM | POA: Diagnosis not present

## 2016-07-26 DIAGNOSIS — D62 Acute posthemorrhagic anemia: Secondary | ICD-10-CM | POA: Diagnosis not present

## 2016-07-26 DIAGNOSIS — I351 Nonrheumatic aortic (valve) insufficiency: Secondary | ICD-10-CM | POA: Insufficient documentation

## 2016-07-26 DIAGNOSIS — G4733 Obstructive sleep apnea (adult) (pediatric): Secondary | ICD-10-CM | POA: Diagnosis present

## 2016-07-26 DIAGNOSIS — I200 Unstable angina: Secondary | ICD-10-CM | POA: Diagnosis present

## 2016-07-26 DIAGNOSIS — I10 Essential (primary) hypertension: Secondary | ICD-10-CM | POA: Diagnosis present

## 2016-07-26 DIAGNOSIS — E7800 Pure hypercholesterolemia, unspecified: Secondary | ICD-10-CM | POA: Diagnosis present

## 2016-07-26 DIAGNOSIS — I2510 Atherosclerotic heart disease of native coronary artery without angina pectoris: Secondary | ICD-10-CM | POA: Diagnosis present

## 2016-07-26 DIAGNOSIS — R42 Dizziness and giddiness: Secondary | ICD-10-CM

## 2016-07-26 DIAGNOSIS — E1169 Type 2 diabetes mellitus with other specified complication: Secondary | ICD-10-CM | POA: Diagnosis present

## 2016-07-26 DIAGNOSIS — E785 Hyperlipidemia, unspecified: Secondary | ICD-10-CM | POA: Diagnosis present

## 2016-07-26 DIAGNOSIS — E119 Type 2 diabetes mellitus without complications: Secondary | ICD-10-CM

## 2016-07-26 DIAGNOSIS — Z951 Presence of aortocoronary bypass graft: Secondary | ICD-10-CM

## 2016-07-26 DIAGNOSIS — E782 Mixed hyperlipidemia: Secondary | ICD-10-CM

## 2016-07-26 HISTORY — DX: Atherosclerotic heart disease of native coronary artery with unstable angina pectoris: I25.110

## 2016-07-26 HISTORY — DX: Type 2 diabetes mellitus without complications: E11.9

## 2016-07-26 HISTORY — PX: CORONARY ARTERY BYPASS GRAFT: SHX141

## 2016-07-26 HISTORY — DX: Presence of aortocoronary bypass graft: Z95.1

## 2016-07-26 HISTORY — PX: TEE WITHOUT CARDIOVERSION: SHX5443

## 2016-07-26 LAB — CBC
HCT: 43.4 % (ref 39.0–52.0)
HCT: 24.4 % — ABNORMAL LOW (ref 39.0–52.0)
HEMATOCRIT: 30.2 % — AB (ref 39.0–52.0)
HEMOGLOBIN: 10.3 g/dL — AB (ref 13.0–17.0)
HEMOGLOBIN: 8.5 g/dL — AB (ref 13.0–17.0)
Hemoglobin: 14.6 g/dL (ref 13.0–17.0)
MCH: 32 pg (ref 26.0–34.0)
MCH: 31.8 pg (ref 26.0–34.0)
MCH: 32.7 pg (ref 26.0–34.0)
MCHC: 33.6 g/dL (ref 30.0–36.0)
MCHC: 34.1 g/dL (ref 30.0–36.0)
MCHC: 34.8 g/dL (ref 30.0–36.0)
MCV: 95.2 fL (ref 78.0–100.0)
MCV: 93.2 fL (ref 78.0–100.0)
MCV: 93.8 fL (ref 78.0–100.0)
PLATELETS: 293 10*3/uL (ref 150–400)
PLATELETS: 164 10*3/uL (ref 150–400)
Platelets: 164 10*3/uL (ref 150–400)
RBC: 4.56 MIL/uL (ref 4.22–5.81)
RBC: 3.24 MIL/uL — AB (ref 4.22–5.81)
RBC: 2.6 MIL/uL — AB (ref 4.22–5.81)
RDW: 12.3 % (ref 11.5–15.5)
RDW: 11.9 % (ref 11.5–15.5)
RDW: 12 % (ref 11.5–15.5)
WBC: 8.7 10*3/uL (ref 4.0–10.5)
WBC: 12.3 10*3/uL — ABNORMAL HIGH (ref 4.0–10.5)
WBC: 10.5 10*3/uL (ref 4.0–10.5)

## 2016-07-26 LAB — POCT I-STAT, CHEM 8
BUN: 21 mg/dL — AB (ref 6–20)
BUN: 19 mg/dL (ref 6–20)
BUN: 15 mg/dL (ref 6–20)
BUN: 17 mg/dL (ref 6–20)
BUN: 15 mg/dL (ref 6–20)
BUN: 13 mg/dL (ref 6–20)
BUN: 11 mg/dL (ref 6–20)
CALCIUM ION: 1.07 mmol/L — AB (ref 1.15–1.40)
CALCIUM ION: 0.98 mmol/L — AB (ref 1.15–1.40)
CALCIUM ION: 1.09 mmol/L — AB (ref 1.15–1.40)
CHLORIDE: 100 mmol/L — AB (ref 101–111)
CHLORIDE: 101 mmol/L (ref 101–111)
CHLORIDE: 97 mmol/L — AB (ref 101–111)
CHLORIDE: 98 mmol/L — AB (ref 101–111)
CHLORIDE: 98 mmol/L — AB (ref 101–111)
CREATININE: 0.5 mg/dL — AB (ref 0.61–1.24)
CREATININE: 0.4 mg/dL — AB (ref 0.61–1.24)
Calcium, Ion: 1.22 mmol/L (ref 1.15–1.40)
Calcium, Ion: 1.2 mmol/L (ref 1.15–1.40)
Calcium, Ion: 1.02 mmol/L — ABNORMAL LOW (ref 1.15–1.40)
Calcium, Ion: 1.18 mmol/L (ref 1.15–1.40)
Chloride: 101 mmol/L (ref 101–111)
Chloride: 101 mmol/L (ref 101–111)
Creatinine, Ser: 0.5 mg/dL — ABNORMAL LOW (ref 0.61–1.24)
Creatinine, Ser: 0.4 mg/dL — ABNORMAL LOW (ref 0.61–1.24)
Creatinine, Ser: 0.3 mg/dL — ABNORMAL LOW (ref 0.61–1.24)
Creatinine, Ser: 0.5 mg/dL — ABNORMAL LOW (ref 0.61–1.24)
Creatinine, Ser: 0.6 mg/dL — ABNORMAL LOW (ref 0.61–1.24)
GLUCOSE: 190 mg/dL — AB (ref 65–99)
GLUCOSE: 174 mg/dL — AB (ref 65–99)
GLUCOSE: 165 mg/dL — AB (ref 65–99)
GLUCOSE: 108 mg/dL — AB (ref 65–99)
Glucose, Bld: 240 mg/dL — ABNORMAL HIGH (ref 65–99)
Glucose, Bld: 206 mg/dL — ABNORMAL HIGH (ref 65–99)
Glucose, Bld: 157 mg/dL — ABNORMAL HIGH (ref 65–99)
HCT: 27 % — ABNORMAL LOW (ref 39.0–52.0)
HCT: 36 % — ABNORMAL LOW (ref 39.0–52.0)
HCT: 31 % — ABNORMAL LOW (ref 39.0–52.0)
HCT: 26 % — ABNORMAL LOW (ref 39.0–52.0)
HCT: 22 % — ABNORMAL LOW (ref 39.0–52.0)
HEMATOCRIT: 39 % (ref 39.0–52.0)
HEMATOCRIT: 36 % — AB (ref 39.0–52.0)
HEMOGLOBIN: 12.2 g/dL — AB (ref 13.0–17.0)
HEMOGLOBIN: 7.5 g/dL — AB (ref 13.0–17.0)
Hemoglobin: 13.3 g/dL (ref 13.0–17.0)
Hemoglobin: 12.2 g/dL — ABNORMAL LOW (ref 13.0–17.0)
Hemoglobin: 9.2 g/dL — ABNORMAL LOW (ref 13.0–17.0)
Hemoglobin: 10.5 g/dL — ABNORMAL LOW (ref 13.0–17.0)
Hemoglobin: 8.8 g/dL — ABNORMAL LOW (ref 13.0–17.0)
POTASSIUM: 4.1 mmol/L (ref 3.5–5.1)
POTASSIUM: 3.7 mmol/L (ref 3.5–5.1)
POTASSIUM: 4.8 mmol/L (ref 3.5–5.1)
Potassium: 4.6 mmol/L (ref 3.5–5.1)
Potassium: 4 mmol/L (ref 3.5–5.1)
Potassium: 3.5 mmol/L (ref 3.5–5.1)
Potassium: 4.4 mmol/L (ref 3.5–5.1)
SODIUM: 136 mmol/L (ref 135–145)
SODIUM: 135 mmol/L (ref 135–145)
SODIUM: 138 mmol/L (ref 135–145)
Sodium: 136 mmol/L (ref 135–145)
Sodium: 138 mmol/L (ref 135–145)
Sodium: 136 mmol/L (ref 135–145)
Sodium: 137 mmol/L (ref 135–145)
TCO2: 28 mmol/L (ref 0–100)
TCO2: 30 mmol/L (ref 0–100)
TCO2: 28 mmol/L (ref 0–100)
TCO2: 29 mmol/L (ref 0–100)
TCO2: 26 mmol/L (ref 0–100)
TCO2: 27 mmol/L (ref 0–100)
TCO2: 25 mmol/L (ref 0–100)

## 2016-07-26 LAB — PROTIME-INR
INR: 1.42
PROTHROMBIN TIME: 17.5 s — AB (ref 11.4–15.2)

## 2016-07-26 LAB — TYPE AND SCREEN
ABO/RH(D): O POS
ANTIBODY SCREEN: NEGATIVE

## 2016-07-26 LAB — POCT I-STAT 3, ART BLOOD GAS (G3+)
Acid-Base Excess: 1 mmol/L (ref 0.0–2.0)
Acid-base deficit: 1 mmol/L (ref 0.0–2.0)
Acid-base deficit: 1 mmol/L (ref 0.0–2.0)
Acid-base deficit: 1 mmol/L (ref 0.0–2.0)
BICARBONATE: 26.5 mmol/L (ref 20.0–28.0)
Bicarbonate: 25.6 mmol/L (ref 20.0–28.0)
Bicarbonate: 24.9 mmol/L (ref 20.0–28.0)
Bicarbonate: 25 mmol/L (ref 20.0–28.0)
O2 SAT: 100 %
O2 SAT: 99 %
O2 SAT: 99 %
O2 SAT: 99 %
PCO2 ART: 44.7 mmHg (ref 32.0–48.0)
PCO2 ART: 45.8 mmHg (ref 32.0–48.0)
PCO2 ART: 49.1 mmHg — AB (ref 32.0–48.0)
PH ART: 7.346 — AB (ref 7.350–7.450)
PO2 ART: 299 mmHg — AB (ref 83.0–108.0)
Patient temperature: 37.2
TCO2: 28 mmol/L (ref 0–100)
TCO2: 27 mmol/L (ref 0–100)
TCO2: 26 mmol/L (ref 0–100)
TCO2: 26 mmol/L (ref 0–100)
pCO2 arterial: 47.6 mmHg (ref 32.0–48.0)
pH, Arterial: 7.381 (ref 7.350–7.450)
pH, Arterial: 7.314 — ABNORMAL LOW (ref 7.350–7.450)
pH, Arterial: 7.329 — ABNORMAL LOW (ref 7.350–7.450)
pO2, Arterial: 146 mmHg — ABNORMAL HIGH (ref 83.0–108.0)
pO2, Arterial: 153 mmHg — ABNORMAL HIGH (ref 83.0–108.0)
pO2, Arterial: 137 mmHg — ABNORMAL HIGH (ref 83.0–108.0)

## 2016-07-26 LAB — COMPREHENSIVE METABOLIC PANEL
ALBUMIN: 3.6 g/dL (ref 3.5–5.0)
ALT: 45 U/L (ref 17–63)
AST: 27 U/L (ref 15–41)
Alkaline Phosphatase: 66 U/L (ref 38–126)
Anion gap: 10 (ref 5–15)
BUN: 20 mg/dL (ref 6–20)
CHLORIDE: 99 mmol/L — AB (ref 101–111)
CO2: 27 mmol/L (ref 22–32)
CREATININE: 0.88 mg/dL (ref 0.61–1.24)
Calcium: 9.1 mg/dL (ref 8.9–10.3)
GFR calc Af Amer: >60 mL/min (ref >60)
GFR calc non Af Amer: >60 mL/min (ref >60)
Glucose, Bld: 176 mg/dL — ABNORMAL HIGH (ref 65–99)
POTASSIUM: 3.8 mmol/L (ref 3.5–5.1)
SODIUM: 136 mmol/L (ref 135–145)
Total Bilirubin: 0.8 mg/dL (ref 0.3–1.2)
Total Protein: 6.5 g/dL (ref 6.5–8.1)

## 2016-07-26 LAB — ECHO TEE
AO mean calculated velocity dopler: 101 cm/s
AOVTI: 25.1 cm
AV Peak grad: 7 mmHg
AVG: 4 mmHg
AVPKVEL: 129 cm/s
LVOT area: 3.8 cm2
LVOT diameter: 22 mm

## 2016-07-26 LAB — POCT I-STAT 4, (NA,K, GLUC, HGB,HCT)
GLUCOSE: 114 mg/dL — AB (ref 65–99)
HCT: 29 % — ABNORMAL LOW (ref 39.0–52.0)
Hemoglobin: 9.9 g/dL — ABNORMAL LOW (ref 13.0–17.0)
Potassium: 3.2 mmol/L — ABNORMAL LOW (ref 3.5–5.1)
Sodium: 141 mmol/L (ref 135–145)

## 2016-07-26 LAB — GLUCOSE, CAPILLARY
GLUCOSE-CAPILLARY: 182 mg/dL — AB (ref 65–99)
GLUCOSE-CAPILLARY: 80 mg/dL (ref 65–99)
GLUCOSE-CAPILLARY: 91 mg/dL (ref 65–99)
GLUCOSE-CAPILLARY: 86 mg/dL (ref 65–99)
GLUCOSE-CAPILLARY: 113 mg/dL — AB (ref 65–99)
Glucose-Capillary: 104 mg/dL — ABNORMAL HIGH (ref 65–99)
Glucose-Capillary: 104 mg/dL — ABNORMAL HIGH (ref 65–99)
Glucose-Capillary: 106 mg/dL — ABNORMAL HIGH (ref 65–99)
Glucose-Capillary: 120 mg/dL — ABNORMAL HIGH (ref 65–99)

## 2016-07-26 LAB — CREATININE, SERUM: Creatinine, Ser: 0.65 mg/dL (ref 0.61–1.24)

## 2016-07-26 LAB — HEMOGLOBIN AND HEMATOCRIT, BLOOD
HCT: 31.1 % — ABNORMAL LOW (ref 39.0–52.0)
Hemoglobin: 11 g/dL — ABNORMAL LOW (ref 13.0–17.0)

## 2016-07-26 LAB — MAGNESIUM: MAGNESIUM: 2.6 mg/dL — AB (ref 1.7–2.4)

## 2016-07-26 LAB — APTT: aPTT: 36 seconds (ref 24–36)

## 2016-07-26 LAB — PLATELET COUNT: Platelets: 233 10*3/uL (ref 150–400)

## 2016-07-26 LAB — MRSA PCR SCREENING: MRSA BY PCR: NEGATIVE

## 2016-07-26 SURGERY — CORONARY ARTERY BYPASS GRAFTING (CABG)
Anesthesia: General | Site: Chest

## 2016-07-26 MED ORDER — PROTAMINE SULFATE 10 MG/ML IV SOLN
INTRAVENOUS | Status: DC | PRN
  Administered 2016-07-26: 200 mg via INTRAVENOUS

## 2016-07-26 MED ORDER — ATORVASTATIN CALCIUM 40 MG PO TABS
40.0000 mg | ORAL_TABLET | Freq: Every day | ORAL | Status: DC
  Administered 2016-07-28 – 2016-07-29 (×2): 40 mg via ORAL
  Filled 2016-07-26 (×2): qty 1

## 2016-07-26 MED ORDER — POTASSIUM CHLORIDE 10 MEQ/50ML IV SOLN
10.0000 meq | INTRAVENOUS | Status: AC
  Administered 2016-07-26 (×3): 10 meq via INTRAVENOUS

## 2016-07-26 MED ORDER — LIDOCAINE 2% (20 MG/ML) 5 ML SYRINGE
INTRAMUSCULAR | Status: AC
  Filled 2016-07-26: qty 10

## 2016-07-26 MED ORDER — ALBUMIN HUMAN 5 % IV SOLN
INTRAVENOUS | Status: DC | PRN
  Administered 2016-07-26: 13:00:00 via INTRAVENOUS

## 2016-07-26 MED ORDER — ORAL CARE MOUTH RINSE
15.0000 mL | Freq: Four times a day (QID) | OROMUCOSAL | Status: DC
  Administered 2016-07-26 – 2016-07-28 (×3): 15 mL via OROMUCOSAL

## 2016-07-26 MED ORDER — LACTATED RINGERS IV SOLN
INTRAVENOUS | Status: DC | PRN
  Administered 2016-07-26: 07:00:00 via INTRAVENOUS

## 2016-07-26 MED ORDER — METOPROLOL TARTRATE 25 MG/10 ML ORAL SUSPENSION: 12.5000 mg | Freq: Two times a day (BID) | ORAL | Status: DC

## 2016-07-26 MED ORDER — CHLORHEXIDINE GLUCONATE 0.12 % MT SOLN
15.0000 mL | OROMUCOSAL | Status: AC
  Administered 2016-07-26: 15 mL via OROMUCOSAL
  Filled 2016-07-26: qty 15

## 2016-07-26 MED ORDER — CEFUROXIME SODIUM 1.5 G IV SOLR
1.5000 g | Freq: Two times a day (BID) | INTRAVENOUS | Status: AC
  Administered 2016-07-26 – 2016-07-28 (×4): 1.5 g via INTRAVENOUS
  Filled 2016-07-26 (×5): qty 1.5

## 2016-07-26 MED ORDER — ROCURONIUM BROMIDE 100 MG/10ML IV SOLN
INTRAVENOUS | Status: DC | PRN
  Administered 2016-07-26: 70 mg via INTRAVENOUS

## 2016-07-26 MED ORDER — SODIUM CHLORIDE 0.9% FLUSH: 3.0000 mL | INTRAVENOUS | Status: DC | PRN

## 2016-07-26 MED ORDER — POTASSIUM CHLORIDE 10 MEQ/50ML IV SOLN
10.0000 meq | INTRAVENOUS | Status: AC
  Administered 2016-07-26 (×2): 10 meq via INTRAVENOUS

## 2016-07-26 MED ORDER — SODIUM CHLORIDE 0.9 % IV SOLN
0.0000 ug/min | INTRAVENOUS | Status: DC
  Filled 2016-07-26: qty 2

## 2016-07-26 MED ORDER — ACETAMINOPHEN 160 MG/5ML PO SOLN: 650.0000 mg | Freq: Once | ORAL | Status: AC

## 2016-07-26 MED ORDER — PROPOFOL 10 MG/ML IV BOLUS
INTRAVENOUS | Status: DC | PRN
  Administered 2016-07-26: 130 mg via INTRAVENOUS

## 2016-07-26 MED ORDER — OXYCODONE HCL 5 MG PO TABS
5.0000 mg | ORAL_TABLET | ORAL | Status: DC | PRN
  Administered 2016-07-27: 5 mg via ORAL
  Administered 2016-07-27 (×4): 10 mg via ORAL
  Administered 2016-07-27: 5 mg via ORAL
  Administered 2016-07-28 – 2016-07-29 (×3): 10 mg via ORAL
  Administered 2016-07-29: 5 mg via ORAL
  Filled 2016-07-26 (×6): qty 2
  Filled 2016-07-26: qty 1
  Filled 2016-07-26 (×3): qty 2
  Filled 2016-07-26: qty 1

## 2016-07-26 MED ORDER — HEPARIN SODIUM (PORCINE) 1000 UNIT/ML IJ SOLN
INTRAMUSCULAR | Status: AC
  Filled 2016-07-26: qty 1

## 2016-07-26 MED ORDER — CHLORHEXIDINE GLUCONATE 0.12% ORAL RINSE (MEDLINE KIT)
15.0000 mL | Freq: Two times a day (BID) | OROMUCOSAL | Status: DC
  Administered 2016-07-26: 15 mL via OROMUCOSAL

## 2016-07-26 MED ORDER — INSULIN REGULAR BOLUS VIA INFUSION
0.0000 [IU] | Freq: Three times a day (TID) | INTRAVENOUS | Status: DC
  Filled 2016-07-26: qty 10

## 2016-07-26 MED ORDER — ROCURONIUM BROMIDE 10 MG/ML (PF) SYRINGE
PREFILLED_SYRINGE | INTRAVENOUS | Status: DC | PRN
  Administered 2016-07-26 (×2): 50 mg via INTRAVENOUS
  Administered 2016-07-26: 70 mg via INTRAVENOUS
  Administered 2016-07-26: 30 mg via INTRAVENOUS
  Administered 2016-07-26: 50 mg via INTRAVENOUS

## 2016-07-26 MED ORDER — NITROGLYCERIN IN D5W 200-5 MCG/ML-% IV SOLN: 0.0000 ug/min | INTRAVENOUS | Status: DC

## 2016-07-26 MED ORDER — MIDAZOLAM HCL 10 MG/2ML IJ SOLN
INTRAMUSCULAR | Status: AC
  Filled 2016-07-26: qty 2

## 2016-07-26 MED ORDER — 0.9 % SODIUM CHLORIDE (POUR BTL) OPTIME
TOPICAL | Status: DC | PRN
  Administered 2016-07-26: 5000 mL

## 2016-07-26 MED ORDER — METOPROLOL TARTRATE 5 MG/5ML IV SOLN: 2.5000 mg | INTRAVENOUS | Status: DC | PRN

## 2016-07-26 MED ORDER — BISACODYL 5 MG PO TBEC
10.0000 mg | DELAYED_RELEASE_TABLET | Freq: Every day | ORAL | Status: DC
  Administered 2016-07-27 – 2016-07-30 (×4): 10 mg via ORAL
  Filled 2016-07-26 (×4): qty 2

## 2016-07-26 MED ORDER — EPHEDRINE 5 MG/ML INJ
INTRAVENOUS | Status: AC
  Filled 2016-07-26: qty 10

## 2016-07-26 MED ORDER — SODIUM CHLORIDE 0.9 % IV SOLN: INTRAVENOUS | Status: DC

## 2016-07-26 MED ORDER — FENTANYL CITRATE (PF) 250 MCG/5ML IJ SOLN
INTRAMUSCULAR | Status: DC | PRN
  Administered 2016-07-26 (×2): 250 ug via INTRAVENOUS
  Administered 2016-07-26: 350 ug via INTRAVENOUS
  Administered 2016-07-26: 250 ug via INTRAVENOUS
  Administered 2016-07-26: 50 ug via INTRAVENOUS
  Administered 2016-07-26: 250 ug via INTRAVENOUS
  Administered 2016-07-26 (×2): 100 ug via INTRAVENOUS
  Administered 2016-07-26: 250 ug via INTRAVENOUS
  Administered 2016-07-26: 150 ug via INTRAVENOUS

## 2016-07-26 MED ORDER — PHENYLEPHRINE 40 MCG/ML (10ML) SYRINGE FOR IV PUSH (FOR BLOOD PRESSURE SUPPORT)
PREFILLED_SYRINGE | INTRAVENOUS | Status: AC
  Filled 2016-07-26: qty 10

## 2016-07-26 MED ORDER — LACTATED RINGERS IV SOLN: INTRAVENOUS | Status: DC

## 2016-07-26 MED ORDER — FENTANYL CITRATE (PF) 250 MCG/5ML IJ SOLN
INTRAMUSCULAR | Status: AC
  Filled 2016-07-26: qty 5

## 2016-07-26 MED ORDER — GELATIN ABSORBABLE MT POWD
OROMUCOSAL | Status: DC | PRN
  Administered 2016-07-26 (×3): 4 mL via TOPICAL

## 2016-07-26 MED ORDER — MIDAZOLAM HCL 2 MG/2ML IJ SOLN
2.0000 mg | INTRAMUSCULAR | Status: DC | PRN
  Filled 2016-07-26: qty 2

## 2016-07-26 MED ORDER — FENTANYL CITRATE (PF) 250 MCG/5ML IJ SOLN
INTRAMUSCULAR | Status: AC
  Filled 2016-07-26: qty 30

## 2016-07-26 MED ORDER — TRAMADOL HCL 50 MG PO TABS
50.0000 mg | ORAL_TABLET | ORAL | Status: DC | PRN
  Administered 2016-07-27 – 2016-07-30 (×8): 100 mg via ORAL
  Filled 2016-07-26 (×8): qty 2

## 2016-07-26 MED ORDER — ROCURONIUM BROMIDE 10 MG/ML (PF) SYRINGE
PREFILLED_SYRINGE | INTRAVENOUS | Status: AC
  Filled 2016-07-26: qty 10

## 2016-07-26 MED ORDER — BISACODYL 10 MG RE SUPP: 10.0000 mg | Freq: Every day | RECTAL | Status: DC

## 2016-07-26 MED ORDER — VANCOMYCIN HCL IN DEXTROSE 1-5 GM/200ML-% IV SOLN
1000.0000 mg | Freq: Once | INTRAVENOUS | Status: AC
  Administered 2016-07-26: 1000 mg via INTRAVENOUS
  Filled 2016-07-26: qty 200

## 2016-07-26 MED ORDER — SODIUM CHLORIDE 0.9 % IV SOLN
0.0000 ug/kg/h | INTRAVENOUS | Status: DC
  Filled 2016-07-26 (×2): qty 2

## 2016-07-26 MED ORDER — SODIUM CHLORIDE 0.9 % IJ SOLN
INTRAMUSCULAR | Status: AC
  Filled 2016-07-26: qty 10

## 2016-07-26 MED ORDER — SODIUM CHLORIDE 0.45 % IV SOLN: INTRAVENOUS | Status: DC | PRN

## 2016-07-26 MED ORDER — ASPIRIN EC 325 MG PO TBEC
325.0000 mg | DELAYED_RELEASE_TABLET | Freq: Every day | ORAL | Status: DC
  Administered 2016-07-27 – 2016-07-29 (×3): 325 mg via ORAL
  Filled 2016-07-26 (×3): qty 1

## 2016-07-26 MED ORDER — SODIUM CHLORIDE 0.9 % IV SOLN
INTRAVENOUS | Status: AC
  Administered 2016-07-26: 15:00:00 via INTRAVENOUS

## 2016-07-26 MED ORDER — SUCCINYLCHOLINE CHLORIDE 20 MG/ML IJ SOLN
INTRAMUSCULAR | Status: DC | PRN
  Administered 2016-07-26: 120 mg via INTRAVENOUS

## 2016-07-26 MED ORDER — ONDANSETRON HCL 4 MG/2ML IJ SOLN
4.0000 mg | Freq: Four times a day (QID) | INTRAMUSCULAR | Status: DC | PRN
  Administered 2016-07-26: 4 mg via INTRAVENOUS
  Filled 2016-07-26: qty 2

## 2016-07-26 MED ORDER — MORPHINE SULFATE (PF) 4 MG/ML IV SOLN
1.0000 mg | INTRAVENOUS | Status: AC | PRN
  Administered 2016-07-26 – 2016-07-28 (×4): 2 mg via INTRAVENOUS
  Administered 2016-07-28: 1 mg via INTRAVENOUS
  Filled 2016-07-26 (×5): qty 1

## 2016-07-26 MED ORDER — ALBUMIN HUMAN 5 % IV SOLN
12.5000 g | INTRAVENOUS | Status: DC | PRN
  Administered 2016-07-26: 12.5 g via INTRAVENOUS

## 2016-07-26 MED ORDER — SODIUM CHLORIDE 0.9% FLUSH
3.0000 mL | Freq: Two times a day (BID) | INTRAVENOUS | Status: DC
Start: 2016-07-27 — End: 2016-07-31
  Administered 2016-07-27 – 2016-07-30 (×5): 3 mL via INTRAVENOUS

## 2016-07-26 MED ORDER — FENTANYL CITRATE (PF) 250 MCG/5ML IJ SOLN
INTRAMUSCULAR | Status: AC
Start: 2016-07-26 — End: 2016-07-26
  Filled 2016-07-26: qty 5

## 2016-07-26 MED ORDER — MIDAZOLAM HCL 5 MG/5ML IJ SOLN
INTRAMUSCULAR | Status: DC | PRN
  Administered 2016-07-26 (×2): 2 mg via INTRAVENOUS
  Administered 2016-07-26 (×2): 3 mg via INTRAVENOUS

## 2016-07-26 MED ORDER — HEPARIN SODIUM (PORCINE) 1000 UNIT/ML IJ SOLN
INTRAMUSCULAR | Status: DC | PRN
  Administered 2016-07-26: 18000 [IU] via INTRAVENOUS
  Administered 2016-07-26: 3000 [IU] via INTRAVENOUS

## 2016-07-26 MED ORDER — MORPHINE SULFATE (PF) 2 MG/ML IV SOLN: 1.0000 mg | INTRAVENOUS | Status: DC | PRN

## 2016-07-26 MED ORDER — FAMOTIDINE IN NACL 20-0.9 MG/50ML-% IV SOLN
20.0000 mg | Freq: Two times a day (BID) | INTRAVENOUS | Status: AC
  Administered 2016-07-26: 20 mg via INTRAVENOUS

## 2016-07-26 MED ORDER — MAGNESIUM SULFATE 4 GM/100ML IV SOLN
4.0000 g | Freq: Once | INTRAVENOUS | Status: AC
  Administered 2016-07-26: 4 g via INTRAVENOUS
  Filled 2016-07-26: qty 100

## 2016-07-26 MED ORDER — PROPOFOL 10 MG/ML IV BOLUS
INTRAVENOUS | Status: AC
  Filled 2016-07-26: qty 20

## 2016-07-26 MED ORDER — ACETAMINOPHEN 500 MG PO TABS
1000.0000 mg | ORAL_TABLET | Freq: Four times a day (QID) | ORAL | Status: DC
  Administered 2016-07-26 – 2016-07-31 (×17): 1000 mg via ORAL
  Filled 2016-07-26 (×17): qty 2

## 2016-07-26 MED ORDER — ACETAMINOPHEN 650 MG RE SUPP
650.0000 mg | Freq: Once | RECTAL | Status: AC
  Administered 2016-07-26: 650 mg via RECTAL

## 2016-07-26 MED ORDER — SODIUM CHLORIDE 0.9 % IV SOLN: 250.0000 mL | INTRAVENOUS | Status: DC

## 2016-07-26 MED ORDER — ALBUMIN HUMAN 5 % IV SOLN
250.0000 mL | INTRAVENOUS | Status: AC | PRN
  Administered 2016-07-26 (×4): 250 mL via INTRAVENOUS
  Filled 2016-07-26: qty 250

## 2016-07-26 MED ORDER — ROCURONIUM BROMIDE 10 MG/ML (PF) SYRINGE
PREFILLED_SYRINGE | INTRAVENOUS | Status: AC
  Filled 2016-07-26: qty 5

## 2016-07-26 MED ORDER — LACTATED RINGERS IV SOLN
INTRAVENOUS | Status: DC | PRN
  Administered 2016-07-26 (×2): via INTRAVENOUS

## 2016-07-26 MED ORDER — ALBUMIN HUMAN 5 % IV SOLN
INTRAVENOUS | Status: AC
  Administered 2016-07-26: 12.5 g via INTRAVENOUS
  Filled 2016-07-26: qty 250

## 2016-07-26 MED ORDER — LACTATED RINGERS IV SOLN: 500.0000 mL | Freq: Once | INTRAVENOUS | Status: DC | PRN

## 2016-07-26 MED ORDER — MORPHINE SULFATE (PF) 4 MG/ML IV SOLN: 1.0000 mg | INTRAVENOUS | Status: AC | PRN

## 2016-07-26 MED ORDER — ACETAMINOPHEN 160 MG/5ML PO SOLN: 1000.0000 mg | Freq: Four times a day (QID) | ORAL | Status: DC

## 2016-07-26 MED ORDER — ASPIRIN 81 MG PO CHEW: 324.0000 mg | CHEWABLE_TABLET | Freq: Every day | ORAL | Status: DC

## 2016-07-26 MED ORDER — METOPROLOL TARTRATE 12.5 MG HALF TABLET
12.5000 mg | ORAL_TABLET | Freq: Two times a day (BID) | ORAL | Status: DC
  Administered 2016-07-27 (×2): 12.5 mg via ORAL
  Filled 2016-07-26 (×2): qty 1

## 2016-07-26 MED ORDER — HEMOSTATIC AGENTS (NO CHARGE) OPTIME
TOPICAL | Status: DC | PRN
  Administered 2016-07-26: 1 via TOPICAL

## 2016-07-26 MED ORDER — PANTOPRAZOLE SODIUM 40 MG PO TBEC
40.0000 mg | DELAYED_RELEASE_TABLET | Freq: Every day | ORAL | Status: DC
  Administered 2016-07-28 – 2016-07-31 (×4): 40 mg via ORAL
  Filled 2016-07-26 (×5): qty 1

## 2016-07-26 MED ORDER — SODIUM CHLORIDE 0.9 % IV SOLN
INTRAVENOUS | Status: DC
  Filled 2016-07-26 (×2): qty 1

## 2016-07-26 MED ORDER — LIDOCAINE 2% (20 MG/ML) 5 ML SYRINGE
INTRAMUSCULAR | Status: AC
  Filled 2016-07-26: qty 5

## 2016-07-26 MED ORDER — CHLORHEXIDINE GLUCONATE 4 % EX LIQD: 30.0000 mL | CUTANEOUS | Status: DC

## 2016-07-26 MED ORDER — SERTRALINE HCL 100 MG PO TABS
100.0000 mg | ORAL_TABLET | Freq: Every day | ORAL | Status: DC
  Administered 2016-07-27 – 2016-07-31 (×5): 100 mg via ORAL
  Filled 2016-07-26 (×5): qty 1

## 2016-07-26 MED ORDER — DOCUSATE SODIUM 100 MG PO CAPS
200.0000 mg | ORAL_CAPSULE | Freq: Every day | ORAL | Status: DC
  Administered 2016-07-27 – 2016-07-31 (×5): 200 mg via ORAL
  Filled 2016-07-26 (×5): qty 2

## 2016-07-26 MED FILL — Lidocaine HCl IV Inj 20 MG/ML: INTRAVENOUS | Qty: 25 | Status: AC

## 2016-07-26 MED FILL — Electrolyte-R (PH 7.4) Solution: INTRAVENOUS | Qty: 4000 | Status: AC

## 2016-07-26 MED FILL — Sodium Bicarbonate IV Soln 8.4%: INTRAVENOUS | Qty: 50 | Status: AC

## 2016-07-26 MED FILL — Magnesium Sulfate Inj 50%: INTRAMUSCULAR | Qty: 10 | Status: AC

## 2016-07-26 MED FILL — Heparin Sodium (Porcine) Inj 1000 Unit/ML: INTRAMUSCULAR | Qty: 30 | Status: AC

## 2016-07-26 MED FILL — Heparin Sodium (Porcine) Inj 1000 Unit/ML: INTRAMUSCULAR | Qty: 20 | Status: AC

## 2016-07-26 MED FILL — Mannitol IV Soln 20%: INTRAVENOUS | Qty: 500 | Status: AC

## 2016-07-26 MED FILL — Potassium Chloride Inj 2 mEq/ML: INTRAVENOUS | Qty: 10 | Status: AC

## 2016-07-26 MED FILL — Sodium Chloride IV Soln 0.9%: INTRAVENOUS | Qty: 2000 | Status: AC

## 2016-07-26 SURGICAL SUPPLY — 101 items
BAG DECANTER FOR FLEXI CONT (MISCELLANEOUS) ×6 IMPLANT
BANDAGE ACE 4X5 VEL STRL LF (GAUZE/BANDAGES/DRESSINGS) ×3 IMPLANT
BANDAGE ACE 6X5 VEL STRL LF (GAUZE/BANDAGES/DRESSINGS) ×3 IMPLANT
BASKET HEART (ORDER IN 25'S) (MISCELLANEOUS) ×1
BASKET HEART (ORDER IN 25S) (MISCELLANEOUS) ×2 IMPLANT
BLADE CLIPPER SURG (BLADE) IMPLANT
BLADE STERNUM SYSTEM 6 (BLADE) ×3 IMPLANT
BNDG GAUZE ELAST 4 BULKY (GAUZE/BANDAGES/DRESSINGS) ×3 IMPLANT
CANISTER SUCT 3000ML PPV (MISCELLANEOUS) ×3 IMPLANT
CANNULA EZ GLIDE AORTIC 21FR (CANNULA) ×6 IMPLANT
CATH CPB KIT OWEN (MISCELLANEOUS) ×3 IMPLANT
CATH THORACIC 36FR (CATHETERS) ×3 IMPLANT
CLIP FOGARTY SPRING 6M (CLIP) ×3 IMPLANT
CLIP RETRACTION 3.0MM CORONARY (MISCELLANEOUS) ×3 IMPLANT
CLIP TI MEDIUM 24 (CLIP) IMPLANT
CLIP TI WIDE RED SMALL 24 (CLIP) ×3 IMPLANT
CONN ST 1/4X3/8  BEN (MISCELLANEOUS) ×1
CONN ST 1/4X3/8 BEN (MISCELLANEOUS) ×2 IMPLANT
CRADLE DONUT ADULT HEAD (MISCELLANEOUS) ×3 IMPLANT
DERMABOND ADVANCED (GAUZE/BANDAGES/DRESSINGS) ×1
DERMABOND ADVANCED .7 DNX12 (GAUZE/BANDAGES/DRESSINGS) ×2 IMPLANT
DRAIN CHANNEL 15F RND FF W/TCR (WOUND CARE) ×3 IMPLANT
DRAIN CHANNEL 32F RND 10.7 FF (WOUND CARE) ×6 IMPLANT
DRAPE CARDIOVASCULAR INCISE (DRAPES) ×1
DRAPE INCISE IOBAN 66X45 STRL (DRAPES) ×3 IMPLANT
DRAPE SLUSH/WARMER DISC (DRAPES) ×3 IMPLANT
DRAPE SRG 135X102X78XABS (DRAPES) ×2 IMPLANT
DRSG COVADERM 4X14 (GAUZE/BANDAGES/DRESSINGS) ×3 IMPLANT
ELECT BLADE 4.0 EZ CLEAN MEGAD (MISCELLANEOUS) ×3
ELECT REM PT RETURN 9FT ADLT (ELECTROSURGICAL) ×6
ELECTRODE BLDE 4.0 EZ CLN MEGD (MISCELLANEOUS) ×2 IMPLANT
ELECTRODE REM PT RTRN 9FT ADLT (ELECTROSURGICAL) ×4 IMPLANT
EVACUATOR SILICONE 100CC (DRAIN) ×3 IMPLANT
FELT TEFLON 1X6 (MISCELLANEOUS) ×6 IMPLANT
GAUZE SPONGE 4X4 12PLY STRL (GAUZE/BANDAGES/DRESSINGS) ×6 IMPLANT
GLOVE ORTHO TXT STRL SZ7.5 (GLOVE) ×6 IMPLANT
GOWN STRL REUS W/ TWL LRG LVL3 (GOWN DISPOSABLE) ×8 IMPLANT
GOWN STRL REUS W/TWL LRG LVL3 (GOWN DISPOSABLE) ×4
HEMOSTAT POWDER SURGIFOAM 1G (HEMOSTASIS) ×9 IMPLANT
INSERT FOGARTY XLG (MISCELLANEOUS) ×3 IMPLANT
KIT BASIN OR (CUSTOM PROCEDURE TRAY) ×3 IMPLANT
KIT ROOM TURNOVER OR (KITS) ×3 IMPLANT
KIT SUCTION CATH 14FR (SUCTIONS) ×9 IMPLANT
KIT VASOVIEW HEMOPRO VH 3000 (KITS) ×3 IMPLANT
LEAD PACING MYOCARDI (MISCELLANEOUS) ×3 IMPLANT
MARKER GRAFT CORONARY BYPASS (MISCELLANEOUS) ×9 IMPLANT
NS IRRIG 1000ML POUR BTL (IV SOLUTION) ×15 IMPLANT
PACK OPEN HEART (CUSTOM PROCEDURE TRAY) ×3 IMPLANT
PAD ARMBOARD 7.5X6 YLW CONV (MISCELLANEOUS) ×6 IMPLANT
PAD ELECT DEFIB RADIOL ZOLL (MISCELLANEOUS) ×3 IMPLANT
PENCIL BUTTON HOLSTER BLD 10FT (ELECTRODE) ×3 IMPLANT
PUNCH AORTIC ROT 4.0MM RCL 40 (MISCELLANEOUS) ×3 IMPLANT
PUNCH AORTIC ROTATE 4.0MM (MISCELLANEOUS) IMPLANT
PUNCH AORTIC ROTATE 4.5MM 8IN (MISCELLANEOUS) IMPLANT
PUNCH AORTIC ROTATE 5MM 8IN (MISCELLANEOUS) IMPLANT
SET CARDIOPLEGIA MPS 5001102 (MISCELLANEOUS) ×3 IMPLANT
SOLUTION ANTI FOG 6CC (MISCELLANEOUS) ×3 IMPLANT
SPONGE LAP 18X18 X RAY DECT (DISPOSABLE) IMPLANT
SPONGE LAP 4X18 X RAY DECT (DISPOSABLE) ×3 IMPLANT
SUT BONE WAX W31G (SUTURE) ×3 IMPLANT
SUT ETHIBOND X763 2 0 SH 1 (SUTURE) ×6 IMPLANT
SUT MNCRL AB 3-0 PS2 18 (SUTURE) ×6 IMPLANT
SUT MNCRL AB 4-0 PS2 18 (SUTURE) ×3 IMPLANT
SUT PDS AB 1 CTX 36 (SUTURE) ×6 IMPLANT
SUT PROLENE 2 0 SH DA (SUTURE) IMPLANT
SUT PROLENE 3 0 SH DA (SUTURE) ×3 IMPLANT
SUT PROLENE 3 0 SH1 36 (SUTURE) ×3 IMPLANT
SUT PROLENE 4 0 RB 1 (SUTURE)
SUT PROLENE 4 0 SH DA (SUTURE) IMPLANT
SUT PROLENE 4-0 RB1 .5 CRCL 36 (SUTURE) IMPLANT
SUT PROLENE 5 0 C 1 36 (SUTURE) IMPLANT
SUT PROLENE 6 0 C 1 30 (SUTURE) ×9 IMPLANT
SUT PROLENE 7.0 RB 3 (SUTURE) ×21 IMPLANT
SUT PROLENE 8 0 BV175 6 (SUTURE) ×6 IMPLANT
SUT PROLENE BLUE 7 0 (SUTURE) ×6 IMPLANT
SUT PROLENE POLY MONO (SUTURE) IMPLANT
SUT SILK  1 MH (SUTURE) ×2
SUT SILK 1 MH (SUTURE) ×4 IMPLANT
SUT SILK 3 0 SH CR/8 (SUTURE) ×3 IMPLANT
SUT STEEL 6MS V (SUTURE) IMPLANT
SUT STEEL STERNAL CCS#1 18IN (SUTURE) IMPLANT
SUT STEEL SZ 6 DBL 3X14 BALL (SUTURE) IMPLANT
SUT VIC AB 1 CTX 36 (SUTURE)
SUT VIC AB 1 CTX36XBRD ANBCTR (SUTURE) IMPLANT
SUT VIC AB 2-0 CT1 27 (SUTURE) ×1
SUT VIC AB 2-0 CT1 TAPERPNT 27 (SUTURE) ×2 IMPLANT
SUT VIC AB 2-0 CTX 27 (SUTURE) IMPLANT
SUT VIC AB 3-0 SH 27 (SUTURE)
SUT VIC AB 3-0 SH 27X BRD (SUTURE) IMPLANT
SUT VIC AB 3-0 X1 27 (SUTURE) IMPLANT
SUT VICRYL 4-0 PS2 18IN ABS (SUTURE) IMPLANT
SUTURE E-PAK OPEN HEART (SUTURE) ×3 IMPLANT
SYSTEM SAHARA CHEST DRAIN ATS (WOUND CARE) ×6 IMPLANT
TOWEL GREEN STERILE (TOWEL DISPOSABLE) ×12 IMPLANT
TOWEL GREEN STERILE FF (TOWEL DISPOSABLE) ×6 IMPLANT
TOWEL OR 17X24 6PK STRL BLUE (TOWEL DISPOSABLE) ×6 IMPLANT
TOWEL OR 17X26 10 PK STRL BLUE (TOWEL DISPOSABLE) ×6 IMPLANT
TRAY FOLEY SILVER 16FR TEMP (SET/KITS/TRAYS/PACK) ×3 IMPLANT
TUBING INSUFFLATION (TUBING) ×3 IMPLANT
UNDERPAD 30X30 (UNDERPADS AND DIAPERS) ×3 IMPLANT
WATER STERILE IRR 1000ML POUR (IV SOLUTION) ×6 IMPLANT

## 2016-07-26 NOTE — Interval H&P Note (Signed)
History and Physical Interval Note:  07/26/2016 6:18 AM  [name] MccoyBrandon [name]  has presented today for surgery, with the diagnosis of CAD  The various methods of treatment have been discussed with the patient and family. After consideration of risks, benefits and other options for treatment, the patient has consented to  Procedure(s): CORONARY ARTERY BYPASS GRAFTING (CABG) (N/A) TRANSESOPHAGEAL ECHOCARDIOGRAM (TEE) (N/A) as a surgical intervention .  The patient's history has been reviewed, patient examined, no change in status, stable for surgery.  I have reviewed the patient's chart and labs.  Questions were answered to the patient's satisfaction.     Purcell Nailslarence H Ithiel Liebler

## 2016-07-26 NOTE — Anesthesia Procedure Notes (Signed)
Procedure Name: Intubation Date/Time: 07/26/2016 8:21 AM Performed by: Coralee RudFLORES, Calliope Delangel Pre-anesthesia Checklist: Patient identified, Emergency Drugs available and Suction available Patient Re-evaluated:Patient Re-evaluated prior to inductionOxygen Delivery Method: Circle system utilized Preoxygenation: Pre-oxygenation with 100% oxygen Intubation Type: IV induction Ventilation: Mask ventilation without difficulty Laryngoscope Size: Miller and 3 Grade View: Grade III Tube type: Oral Tube size: 7.5 mm Number of attempts: 1 Airway Equipment and Method: Stylet Placement Confirmation: ETT inserted through vocal cords under direct vision,  positive ETCO2 and breath sounds checked- equal and bilateral Secured at: 22 cm Tube secured with: Tape Dental Injury: Teeth and Oropharynx as per pre-operative assessment  Comments: Anterior larynx.  Required laryngeal pressure and manipulation. Hockey stick shape on ETT

## 2016-07-26 NOTE — OR Nursing (Signed)
Forty-five minute call to SICU at 1307. Sp[place] to [place].

## 2016-07-26 NOTE — Transfer of Care (Signed)
Immediate Anesthesia Transfer of Care Note  Patient: [name] [name]  Procedure(s) Performed: Procedure(s) with comments: CORONARY ARTERY BYPASS GRAFTING (CABG), ON PUMP, TIMES FIVE, USING BILATERAL INTERNAL MAMMARY ARTERIES AND ENDOSCOPICALLY HARVESTED RIGHT GREATER SAPHENOUS VEIN (N/A) - LIMA to LAD RIMA to Acute Marginal SVG to PDA SVG to Diag 2 SVG to OM1 TRANSESOPHAGEAL ECHOCARDIOGRAM (TEE) (N/A)  Patient Location: SICU  Anesthesia Type:General  Level of Consciousness: Patient remains intubated per anesthesia plan  Airway & Oxygen Therapy: Patient remains intubated per anesthesia plan and Patient placed on Ventilator (see vital sign flow sheet for setting)  Post-op Assessment: Report given to RN and Post -op Vital signs reviewed and stable  Post vital signs: Reviewed and stable  Last Vitals:  Vitals:   07/26/16 0601  BP: 133/83  Pulse: 82  Resp: 20  Temp: 37.2 C    Last Pain:  Vitals:   07/26/16 0601  TempSrc: Oral         Complications: No apparent anesthesia complications

## 2016-07-26 NOTE — Brief Op Note (Signed)
07/26/2016  12:25 PM  PATIENT:  Marylou MccoyBrandon Galyon  46 y.o. male  PRE-OPERATIVE DIAGNOSIS:  CAD  POST-OPERATIVE DIAGNOSIS:  CAD  PROCEDURE:  Procedure(s): CORONARY ARTERY BYPASS GRAFTING (CABG), ON PUMP, TIMES FIVE, USING BILATERAL INTERNAL MAMMARY ARTERIES AND ENDOSCOPICALLY HARVESTED RIGHT GREATER SAPHENOUS VEIN (N/A) TRANSESOPHAGEAL ECHOCARDIOGRAM (TEE) (N/A)  LIMA to LAD RIMA to Acute Marginal SVG to PDA SVG to Diag 2 SVG to OM1   SURGEON:  Surgeon(s) and Role:    * Purcell Nailswen, Clarence H, MD - Primary  PHYSICIAN ASSISTANT:  Jari Favreessa Norval Slaven, PA-C   ANESTHESIA:   GENERAL  EBL:  Total I/O In: 800 [I.V.:800] Out: 625 [Urine:500; Blood:125]  BLOOD ADMINISTERED:none  DRAINS: ROUTINE   LOCAL MEDICATIONS USED:  NONE  SPECIMEN:  No Specimen  DISPOSITION OF SPECIMEN:  N/A  COUNTS:  YES  TOURNIQUET:  * No tourniquets in log *  DICTATION: .Dragon Dictation  PLAN OF CARE: Admit to inpatient   PATIENT DISPOSITION:  ICU - intubated and hemodynamically stable.   Delay start of Pharmacological VTE agent (>24hrs) due to surgical blood loss or risk of bleeding: yes

## 2016-07-26 NOTE — Progress Notes (Signed)
07/26/2016 1445 Verbal order Dr. Cornelius Moraswen, give 5 runs KCL total. Orders placed and enacted. Will continue to closely monitor patient.  Dwana Garin, Blanchard KelchStephanie Ingold

## 2016-07-26 NOTE — Procedures (Signed)
Extubation Procedure Note  Patient Details:   Name: Marylou MccoyBrandon Welch DOB: 05/11/1970 MRN: [id number]   Airway Documentation:     Evaluation  O2 sats: stable throughout Complications: No apparent complications Patient did tolerate procedure well. Bilateral Breath Sounds: Clear, Diminished   Yes   Pt extubated to a 4lpm Teller   Melanee Spryelson, Medardo Hassing Lawson 07/26/2016, 6:42 PM

## 2016-07-26 NOTE — Anesthesia Postprocedure Evaluation (Addendum)
Anesthesia Post Note  Patient: Marylou MccoyBrandon Wittner  Procedure(s) Performed: Procedure(s) (LRB): CORONARY ARTERY BYPASS GRAFTING (CABG), ON PUMP, TIMES FIVE, USING BILATERAL INTERNAL MAMMARY ARTERIES AND ENDOSCOPICALLY HARVESTED RIGHT GREATER SAPHENOUS VEIN (N/A) TRANSESOPHAGEAL ECHOCARDIOGRAM (TEE) (N/A)  Patient location during evaluation: SICU Anesthesia Type: General Level of consciousness: sedated Pain management: pain level controlled Vital Signs Assessment: post-procedure vital signs reviewed and stable Respiratory status: patient remains intubated per anesthesia plan Cardiovascular status: stable Anesthetic complications: no       Last Vitals:  Vitals:   07/26/16 0601  BP: 133/83  Pulse: 82  Resp: 20  Temp: 37.2 C    Last Pain:  Vitals:   07/26/16 0601  TempSrc: Oral                 Kenrick Pore DANIEL

## 2016-07-26 NOTE — Progress Notes (Signed)
NIF -30 VC 2.0L

## 2016-07-26 NOTE — H&P (View-Only) (Signed)
[address]       [name] [id number]             [phone]          CARDIOTHORACIC SURGERY CONSULTATION REPORT  PCP is [name], [name], MD Referring Provider is [name], [name], MD Primary Cardiologist is [name], [name], MD (new)   Reason for consultation:  Severe 3-vessel CAD with new onset angina pectoris  HPI:  Patient is a 46 year old white male with no previous history of coronary artery disease but risk factors notable for history of hypertension, poorly controlled insulin-dependent type 2 diabetes mellitus, hyperlipidemia, and a family history of coronary artery disease who has been referred for surgical consultation to discuss treatment options for management of severe multivessel coronary artery disease with new onset angina pectoris.  The patient states he was in his usual state of health until 2 or 3 weeks ago when he first began to notice symptoms of upper back pain and epigastric pain brought on with physical exertion and relieved by rest. He noticed this initially when he was exercising on a treadmill. Every time he would stop the symptoms would resolve. Symptoms increased in severity, prompting him to present to to the emergency department 07/16/2016. Initially ECG revealed sinus tachycardia without acute changes. Troponins were negative. CT angiogram revealed no evidence of pulmonary embolism or aortic dissection but was notable for the presence of significant coronary calcification. The patient was admitted to the hospital and ruled out for myocardial infarction. He underwent diagnostic cardiac catheterization demonstrating severe three-vessel coronary artery disease with normal left ventricular systolic function. Cardiothoracic surgical consultation was requested.  The patient is married and lives in [place] his wife. He works for [employer] which requires carrying a backpack. He is very active on the job and exercises regularly. He  has been in his usual state of health until very recently when he developed exertional pain in the upper mid back which radiates to the epigastric region. Symptoms are always relieved by rest. He denies nocturnal chest pain or shortness of breath. He has not experienced any exertional shortness of breath, PND, orthopnea, or lower extremity edema. He denies any dizzy spells, palpitations, or syncope.   Past Medical History:  Diagnosis Date  . Anxiety   . Coronary artery calcification    a. noted on CT angio 07/16/16  . High cholesterol   . HTN (hypertension)   . OSA on CPAP    "don't wear it that much" (07/19/2016)  . Type 2 diabetes mellitus (HCC)     Past Surgical History:  Procedure Laterality Date  . CARDIAC CATHETERIZATION  07/19/2016  . LEFT HEART CATH AND CORONARY ANGIOGRAPHY N/A 07/19/2016   Procedure: Left Heart Cath and Coronary Angiography;  Surgeon: Corky CraftsVaranasi, Jayadeep S, MD;  [facility];  Service: Cardiovascular;  Laterality: N/A;  . ORCHIOPEXY Right ~ 1980   "undescending testicle"    Family History  Problem Relation Age of Onset  . Heart failure Mother   . CAD Paternal Grandfather   . Heart attack Paternal Grandfather     Social History   Social History  . Marital status: Married    Spouse name: N/A  . Number of children: N/A  . Years of education: N/A   Occupational History  . Not on file.   Social History Main Topics  . Smoking status: Never Smoker  . Smokeless tobacco: Never Used  . Alcohol use  No  . Drug use: No  . Sexual activity: Yes   Other Topics Concern  . Not on file   Social History Narrative  . No narrative on file    Prior to Admission medications   Medication Sig Start Date End Date Taking? Authorizing Provider  aspirin 81 MG chewable tablet Chew 1 tablet (81 mg total) by mouth daily. 07/16/16  Yes Arby Barrette, MD  atorvastatin (LIPITOR) 10 MG tablet Take 10 mg by mouth daily.   Yes [provider]    glimepiride (AMARYL) 1 MG tablet Take 1 mg by mouth daily with breakfast.   Yes [provider]  insulin glargine (LANTUS) 100 UNIT/ML injection Inject 50 Units into the skin at bedtime.   Yes [provider]  lisinopril (PRINIVIL,ZESTRIL) 5 MG tablet Take 5 mg by mouth daily.   Yes [provider]  metFORMIN (GLUCOPHAGE) 1000 MG tablet Take 1,000 mg by mouth 2 (two) times daily with a meal.   Yes [provider]  rOPINIRole (REQUIP) 0.5 MG tablet Take 0.25 mg by mouth at bedtime.   Yes [provider]  sertraline (ZOLOFT) 100 MG tablet Take 100 mg by mouth daily.   Yes [provider]    Current Facility-Administered Medications  Medication Dose Route Frequency Provider Last Rate Last Dose  . 0.9 %  sodium chloride infusion  250 mL Intravenous PRN Corky Crafts, MD      . acetaminophen (TYLENOL) tablet 650 mg  650 mg Oral Q4H PRN Corky Crafts, MD      . aspirin chewable tablet 81 mg  81 mg Oral Daily Corky Crafts, MD      . atorvastatin (LIPITOR) tablet 40 mg  40 mg Oral q1800 Tonny Bollman, MD      . glimepiride (AMARYL) tablet 1 mg  1 mg Oral Q breakfast Corky Crafts, MD   1 mg at 07/20/16 0800  . heparin injection 5,000 Units  5,000 Units Subcutaneous Q8H Corky Crafts, MD   5,000 Units at 07/20/16 [phone]  . insulin aspart (novoLOG) injection 0-15 Units  0-15 Units Subcutaneous TID WC Corky Crafts, MD      . insulin glargine (LANTUS) injection 50 Units  50 Units Subcutaneous QHS Corky Crafts, MD   50 Units at 07/19/16 2155  . lisinopril (PRINIVIL,ZESTRIL) tablet 5 mg  5 mg Oral Daily Corky Crafts, MD   5 mg at 07/19/16 2045  . metoprolol tartrate (LOPRESSOR) tablet 25 mg  25 mg Oral BID Tonny Bollman, MD      . ondansetron [facility]) injection 4 mg  4 mg Intravenous Q6H PRN Corky Crafts, MD      . rOPINIRole (REQUIP) tablet 0.25 mg  0.25 mg Oral QHS Corky Crafts, MD   0.25 mg at 07/19/16 2155  . sertraline (ZOLOFT) tablet 100 mg  100 mg Oral Daily Corky Crafts, MD   100 mg at 07/19/16 2045  . sodium chloride flush (NS) 0.9 % injection 3 mL  3 mL Intravenous Q12H Lance Muss S, MD      . sodium chloride flush (NS) 0.9 % injection 3 mL  3 mL Intravenous PRN Corky Crafts, MD        No Known Allergies    Review of Systems:   General:  normal appetite, normal energy, no weight gain, no weight loss, no fever  Cardiac:  + chest pain with exertion, no chest pain at rest,  no SOB with exertion, no resting SOB, no PND, no orthopnea, no palpitations, no arrhythmia, no atrial fibrillation, no LE edema, no dizzy spells, no syncope  Respiratory:  no shortness of breath, no home oxygen, no productive cough, no dry cough, no bronchitis, no wheezing, no hemoptysis, no asthma, no pain with inspiration or cough, no sleep apnea, no CPAP at night  GI:   no difficulty swallowing, no reflux, no frequent heartburn, no hiatal hernia, no abdominal pain, no constipation, no diarrhea, no hematochezia, no hematemesis, no melena  GU:   no dysuria,  no frequency, no urinary tract infection, no hematuria, no enlarged prostate, no kidney stones, no kidney disease  Vascular:  no pain suggestive of claudication, no pain in feet, no leg cramps, no varicose veins, no DVT, no non-healing foot ulcer  Neuro:   no stroke, no TIA's, no seizures, no headaches, no temporary blindness one eye,  no slurred speech, no peripheral neuropathy, no chronic pain, no instability of gait, no memory/cognitive dysfunction  Musculoskeletal: no arthritis , no joint swelling, no myalgias, no difficulty walking, normal mobility   Skin:   no rash, no itching, no skin infections, no pressure sores or ulcerations  Psych:   + anxiety, + depression, no nervousness, no unusual recent stress  Eyes:   no blurry vision, no floaters, no recent vision changes, + wears glasses for reading  only  ENT:   no hearing loss, no loose or painful teeth, no dentures, last saw dentist 1 year ago  Hematologic:  no easy bruising, no abnormal bleeding, no clotting disorder, no frequent epistaxis  Endocrine:  + diabetes, does check CBG's at home, most recent Hgb a1c > 8     Physical Exam:   BP 125/80 (BP Location: Left Arm)   Pulse 78   Temp 98.7 F (37.1 C) (Oral)   Resp 18   Ht 5\' 9"  (1.753 m)   Wt 175 lb (79.4 kg)   SpO2 99%   BMI 25.84 kg/m   General:    well-appearing  HEENT:  Unremarkable   Neck:   no JVD, no bruits, no adenopathy   Chest:   clear to auscultation, symmetrical breath sounds, no wheezes, no rhonchi   CV:   RRR, no  murmur   Abdomen:  soft, non-tender, no masses   Extremities:  warm, well-perfused, pulses palpable, no lower extremity edema  Rectal/GU  Deferred  Neuro:   Grossly non-focal and symmetrical throughout  Skin:   Clean and dry, no rashes, no breakdown  Diagnostic Tests:  Left Heart Cath and Coronary Angiography  Conclusion     Ost 1st Diag to 1st Diag lesion, 90 %stenosed.  Prox LAD lesion, 90 %stenosed.  Ost 1st Mrg to 1st Mrg lesion, 80 %stenosed.  Mid RCA lesion, 80 %stenosed.  LPDA lesion, 100 %stenosed. Left to left collaterals, right to left collaterals.  Mid LAD lesion, 80 %stenosed.  Ost 2nd Diag lesion, 90 %stenosed.  The left ventricular systolic function is normal.  LV end diastolic pressure is normal.  The left ventricular ejection fraction is 55-65% by visual estimate.  There is no aortic valve stenosis.   Severe native three vessel disease.  Left dominant system.  Will obtain cardiac surgery consult in AM.   Indications   Abnormal stress test [R94.39 (ICD-10-CM)]  Unstable angina (HCC) [I20.0 (ICD-10-CM)]  Procedural Details/Technique   Technical Details The risks, benefits, and details of the procedure were explained to the patient. The patient verbalized understanding and wanted  to proceed. Informed  written consent was obtained.  PROCEDURE TECHNIQUE: After Xylocaine anesthesia a 84F slender sheath was placed in the right radial artery with a single anterior needle wall stick. IV Heparin was given. Left ventriculography was done using a JR4 catheter. Right coronary angiography was done using a Judkins R4 guide catheter. Left coronary angiography was done using a Judkins L3.5 guide catheter. A TR band was used for hemostasis.  Contrast: 60 cc    Estimated blood loss <50 mL.  During this procedure the patient was administered the following to achieve and maintain moderate conscious sedation: Versed 2 mg, Fentanyl 25 mcg, while the patient's heart rate, blood pressure, and oxygen saturation were continuously monitored. The period of conscious sedation was 29 minutes, of which I was present face-to-face 100% of this time.    Coronary Findings   Dominance: Left  Left Anterior Descending  Prox LAD lesion, 90% stenosed.  Mid LAD lesion, 80% stenosed.  First Diagonal Branch  Vessel is small in size.  Ost 1st Diag to 1st Diag lesion, 90% stenosed.  Lateral First Diagonal Branch  Vessel is small in size.  Second Diagonal [employer] 2nd Diag lesion, 90% stenosed.  Left Circumflex  First Obtuse Marginal Branch  Ost 1st Mrg to 1st Mrg lesion, 80% stenosed.  Left Posterior Descending Artery  LPDA filled by collaterals from Dist RCA. LPDA filled by collaterals from Dist LAD.  LPDA lesion, 100% stenosed.  Right Coronary Artery  Vessel is small.  Mid RCA lesion, 80% stenosed.  Wall Motion   Resting               Left Heart   Left Ventricle The left ventricular size is normal. The left ventricular systolic function is normal. LV end diastolic pressure is normal. The left ventricular ejection fraction is 55-65% by visual estimate.    Aortic Valve There is no aortic valve stenosis.    Coronary Diagrams   Diagnostic Diagram       Implants     No implant documentation for this  case.  PACS Images   Show images for Cardiac catheterization   Link to Procedure Log   Procedure Log    Hemo Data    Most Recent Value  AO Systolic Pressure 115 mmHg  AO Diastolic Pressure 71 mmHg  AO Mean 91 mmHg  LV Systolic Pressure 129 mmHg  LV Diastolic Pressure 0 mmHg  LV EDP 5 mmHg  Arterial Occlusion Pressure Extended Systolic Pressure 123 mmHg  Arterial Occlusion Pressure Extended Diastolic Pressure 78 mmHg  Arterial Occlusion Pressure Extended Mean Pressure 99 mmHg  Left Ventricular Apex Extended Systolic Pressure 128 mmHg  Left Ventricular Apex Extended Diastolic Pressure 3 mmHg  Left Ventricular Apex Extended EDP Pressure 7 mmHg      Impression:  Patient has severe multivessel coronary artery disease but preserved left ventricular systolic function and presents with new-onset symptoms of exertional pain in the upper mid back radiating to the epigastric region consistent with angina pectoris.  Symptoms are always brought on with physical exertion and probably relieved by rest. I have personally reviewed the patient's diagnostic catheterization which demonstrates severe diffuse coronary artery disease consistent with poorly controlled long-standing diabetes mellitus. There is high-grade proximal stenosis involving all 3 major vascular territories. There are relatively poor target vessels for grafting, but the patient does appear to have adequate targets to facilitate surgical revascularization. I agree that coronary artery bypass grafting is the best option for long-term management.  Plan:  I have reviewed the indications, risks, and potential benefits of coronary artery bypass grafting with the patient and his wife.  Alternative treatment strategies have been discussed, including the relative risks, benefits and long term prognosis associated with medical therapy, percutaneous coronary intervention, and surgical revascularization.  The patient understands and accepts all  potential associated risks of surgery including but not limited to risk of death, stroke or other neurologic complication, myocardial infarction, congestive heart failure, respiratory failure, renal failure, bleeding requiring blood transfusion and/or reexploration, aortic dissection or other major vascular complication, arrhythmia, heart block or bradycardia requiring permanent pacemaker, pneumonia, pleural effusion, wound infection, pulmonary embolus or other thromboembolic complication, chronic pain or other delayed complications related to median sternotomy, or the late recurrence of symptomatic ischemic heart disease and/or congestive heart failure.  The importance of long term risk modification have been emphasized.  All questions answered.  We plan to proceed with surgery on Thursday, 07/26/2016.  I agree with plans to let the patient go home during the interim period of time as long as he receives a prescription for sublingual nitroglycerin to be utilized as needed. The patient has been educated regarding symptoms to be concerned about. All questions have been addressed.   I spent in excess of 120 minutes during the conduct of this hospital consultation and >50% of this time involved direct face-to-face encounter for counseling and/or coordination of the patient's care.    Salvatore Decent. Cornelius Moras, MD 07/20/2016 9:39 AM

## 2016-07-26 NOTE — Progress Notes (Signed)
07/26/2016 1500 Dr. Cornelius Moraswen at bedside, made aware of ABG results. Verbal order received to increased RR on ventilator to 16 and have RT perform recruitment maneuver. RT notified and orders enacted. Will continue to closely monitor patient.  Fatemah Pourciau, Blanchard KelchStephanie Ingold

## 2016-07-26 NOTE — Op Note (Signed)
CARDIOTHORACIC SURGERY OPERATIVE NOTE  Date of Procedure: 07/26/2016  Preoperative Diagnosis:   Severe 3-vessel Coronary Artery Disease  Unstable Angina Pectoris  Postoperative Diagnosis: Same  Procedure:    Coronary Artery Bypass Grafting x 5   Left Internal Mammary Artery to Distal Left Anterior Descending Coronary Artery  Right Internal Mammary Artery to Acute Marginal Branch of Right Coronary Artery  Saphenous Vein Graft to Posterior Descending Coronary Artery  Saphenous Vein Graft to First Obtuse Marginal Branch of Left Circumflex Coronary Artery  Sapheonous Vein Graft to Second Diagonal Branch Coronary Artery  Endoscopic Vein Harvest from Right Thigh and Lower Leg  Surgeon: Salvatore Decentlarence H. Cornelius Moraswen, MD  Assistant: Jari Favreessa Conte, PA-C  Anesthesia: Remonia RichterJames Daniel Singer, MD  Operative Findings:  Normal left ventricular systolic function  Good quality left and right internal mammary artery conduits  Good quality saphenous vein conduit  Fair to good quality target vessels for grafting    BRIEF CLINICAL NOTE AND INDICATIONS FOR SURGERY  Patient is a 46 year old white male with no previous history of coronary artery disease but risk factors notable for history of hypertension, poorly controlled insulin-dependent type 2 diabetes mellitus, hyperlipidemia, and a family history of coronary artery disease who has been referred for surgical consultation to discuss treatment options for management of severe multivessel coronary artery disease with new onset angina pectoris.  The patient states he was in his usual state of health until 2 or 3 weeks ago when he first began to notice symptoms of upper back pain and epigastric pain brought on with physical exertion and relieved by rest. He noticed this initially when he was exercising on a treadmill. Every time he would stop the symptoms would resolve. Symptoms increased in severity, prompting him to present to to the emergency department  07/16/2016. Initially ECG revealed sinus tachycardia without acute changes. Troponins were negative. CT angiogram revealed no evidence of pulmonary embolism or aortic dissection but was notable for the presence of significant coronary calcification. The patient was admitted to the hospital and ruled out for myocardial infarction. He underwent diagnostic cardiac catheterization demonstrating severe three-vessel coronary artery disease with normal left ventricular systolic function. Cardiothoracic surgical consultation was requested.  The patient has been seen in consultation and counseled at length regarding the indications, risks and potential benefits of surgery.  All questions have been answered, and the patient provides full informed consent for the operation as described.    DETAILS OF THE OPERATIVE PROCEDURE  Preparation:  The patient is brought to the operating room on the above mentioned date and central monitoring was established by the anesthesia team including placement of Swan-Ganz catheter and radial arterial line. The patient is placed in the supine position on the operating table.  Intravenous antibiotics are administered. General endotracheal anesthesia is induced uneventfully. A Foley catheter is placed.  Baseline transesophageal echocardiogram was performed.  Findings were notable for low normal LV systolic function.  The patient's chest, abdomen, both groins, and both lower extremities are prepared and draped in a sterile manner. A time out procedure is performed.   Surgical Approach and Conduit Harvest:  A median sternotomy incision was performed and the left internal mammary artery is dissected from the chest wall and prepared for bypass grafting. The left internal mammary artery is notably good quality conduit.  Subsequently the right internal mammary artery is dissected from the chest wall and prepared for bypass grafting. The right internal mammary artery is notably good  quality conduit.  Simultaneously, the greater  saphenous vein is obtained from the patient's right thigh and leg using endoscopic vein harvest technique. The saphenous vein is notably good quality conduit. After removal of the saphenous vein, the small surgical incisions in the lower extremity are closed with absorbable suture. Following systemic heparinization, the left internal mammary artery was transected distally noted to have excellent flow.   Extracorporeal Cardiopulmonary Bypass and Myocardial Protection:  The pericardium is opened. The ascending aorta is normal in appearance. The ascending aorta and the right atrium are cannulated for cardiopulmonary bypass.  Adequate heparinization is verified.     The entire pre-bypass portion of the operation was notable for stable hemodynamics.  Cardiopulmonary bypass was begun and the surface of the heart is inspected. Distal target vessels are selected for coronary artery bypass grafting. A cardioplegia cannula is placed in the ascending aorta.  A temperature probe was placed in the interventricular septum.  The patient is allowed to cool passively to [facility] systemic temperature.   The aortic cross clamp is applied and cardioplegia is delivered initially in an antegrade fashion through the aortic root using modified [place] cold blood cardioplegia (KBC protocol).   The initial cardioplegic arrest is rapid with early diastolic arrest.   Repeat doses of cardioplegia are administered intermittently throughout the entire cross clamp portion of the operation through the aortic root and through subsequently placed vein grafts in order to maintain completely flat electrocardiogram and septal myocardial temperature below 15C.  Myocardial protection was felt to be excellent.  Coronary Artery Bypass Grafting:   The posterior descending branch of the right coronary artery was grafted using a reversed saphenous vein graft in an end-to-side fashion.  At the site of  distal anastomosis the target vessel was fair quality and measured approximately 1.0 mm in diameter.  The first obtuse marginal branch of the left circumflex coronary artery was grafted using a reversed saphenous vein graft in an end-to-side fashion.  At the site of distal anastomosis the target vessel was good quality and measured approximately 2.0 mm in diameter.  The second diagonal branch of the left anterior descending coronary artery was grafted using a reversed saphenous vein graft in an end-to-side fashion.  At the site of distal anastomosis the target vessel was fair quality and measured approximately 1.3 mm in diameter.  The acute marginal branch of the right coronary artery was grafted with the right internal mammary artery in an end-to-side fashion.  At the site of distal anastomosis the target vessel was good quality and measured approximately 1.3 mm in diameter.  The distal left anterior coronary artery was grafted with the left internal mammary artery in an end-to-side fashion.  At the site of distal anastomosis the target vessel was good quality and measured approximately 1.5 mm in diameter.  All proximal vein graft anastomoses were placed directly to the ascending aorta prior to removal of the aortic cross clamp.  The septal myocardial temperature rose rapidly after reperfusion of the left internal mammary artery graft.  The aortic cross clamp was removed after a total cross clamp time of 85 minutes.   Procedure Completion:  All proximal and distal coronary anastomoses were inspected for hemostasis and appropriate graft orientation. Epicardial pacing wires are fixed to the right ventricular outflow tract and to the right atrial appendage. The patient is rewarmed to 37C temperature. The patient is weaned and disconnected from cardiopulmonary bypass.  The patient's rhythm at separation from bypass was sinus.  The patient was weaned from cardiopulmonary bypass without any  inotropic  support. Total cardiopulmonary bypass time for the operation was 105 minutes.  Followup transesophageal echocardiogram performed after separation from bypass revealed no changes from the preoperative exam.  The aortic and venous cannula were removed uneventfully. Protamine was administered to reverse the anticoagulation. The mediastinum and pleural space were inspected for hemostasis and irrigated with saline solution. The mediastinum and both pleurals space were drained using 4 chest tubes placed through separate stab incisions inferiorly.  The soft tissues anterior to the aorta were reapproximated loosely. The sternum is closed with double strength sternal wire. The soft tissues anterior to the sternum were closed in multiple layers and the skin is closed with a running subcuticular skin closure.  The post-bypass portion of the operation was notable for stable rhythm and hemodynamics.  No blood products were administered during the operation.   Disposition:  The patient tolerated the procedure well and is transported to the surgical intensive care in stable condition. There are no intraoperative complications. All sponge instrument and needle counts are verified correct at completion of the operation.    Salvatore Decent. Cornelius Moras MD 07/26/2016 1:47 PM

## 2016-07-26 NOTE — Progress Notes (Signed)
Patient ID: Marylou MccoyBrandon Eshelman, male   DOB: 09/08/1970, 46 y.o.   MRN: [id number]   SICU Evening Rounds:   Hemodynamically stable  CI = 3  Awake and weaning on vent  Urine output good  CT output low  CBC    Component Value Date/Time   WBC 12.3 (H) 07/26/2016 1430   RBC 3.24 (L) 07/26/2016 1430   HGB 9.9 (L) 07/26/2016 1436   HCT 29.0 (L) 07/26/2016 1436   PLT 164 07/26/2016 1430   MCV 93.2 07/26/2016 1430   MCH 31.8 07/26/2016 1430   MCHC 34.1 07/26/2016 1430   RDW 11.9 07/26/2016 1430     BMET    Component Value Date/Time   NA 141 07/26/2016 1436   K 3.2 (L) 07/26/2016 1436   CL 98 (L) 07/26/2016 1306   CO2 27 07/26/2016 0615   GLUCOSE 114 (H) 07/26/2016 1436   BUN 13 07/26/2016 1306   CREATININE 0.40 (L) 07/26/2016 1306   CALCIUM 9.1 07/26/2016 0615   GFRNONAA >60 07/26/2016 0615   GFRAA >60 07/26/2016 0615     A/P:  Stable postop course. Continue current plans

## 2016-07-26 NOTE — Anesthesia Procedure Notes (Signed)
Central Venous Catheter Insertion Performed by: Arta BruceSSEY, Mariette Cowley, anesthesiologist Start/End5/17/2018 6:30 AM, 07/26/2016 6:45 AM Preanesthetic checklist: patient identified, IV checked, risks and benefits discussed, surgical consent, monitors and equipment checked, pre-op evaluation, timeout performed and anesthesia consent Position: Trendelenburg Lidocaine 1% used for infiltration and patient sedated Hand hygiene performed , maximum sterile barriers used  and Seldinger technique used Central line was placed.Sheath introducer Swan type:thermodilution Procedure performed using ultrasound guided technique. Ultrasound Notes:anatomy identified, needle tip was noted to be adjacent to the nerve/plexus identified, no ultrasound evidence of intravascular and/or intraneural injection and image(s) printed for medical record Attempts: 1 Following insertion, line sutured and dressing applied. Post procedure assessment: blood return through all ports, free fluid flow and no air  Patient tolerated the procedure well with no immediate complications.

## 2016-07-27 ENCOUNTER — Inpatient Hospital Stay (HOSPITAL_COMMUNITY): Payer: 59

## 2016-07-27 ENCOUNTER — Encounter (HOSPITAL_COMMUNITY): Payer: Self-pay | Admitting: Thoracic Surgery (Cardiothoracic Vascular Surgery)

## 2016-07-27 LAB — CBC
HCT: 25.2 % — ABNORMAL LOW (ref 39.0–52.0)
HCT: 26.4 % — ABNORMAL LOW (ref 39.0–52.0)
Hemoglobin: 8.5 g/dL — ABNORMAL LOW (ref 13.0–17.0)
Hemoglobin: 9.2 g/dL — ABNORMAL LOW (ref 13.0–17.0)
MCH: 32 pg (ref 26.0–34.0)
MCH: 32.9 pg (ref 26.0–34.0)
MCHC: 33.7 g/dL (ref 30.0–36.0)
MCHC: 34.8 g/dL (ref 30.0–36.0)
MCV: 94.7 fL (ref 78.0–100.0)
MCV: 94.3 fL (ref 78.0–100.0)
PLATELETS: 159 10*3/uL (ref 150–400)
PLATELETS: 155 10*3/uL (ref 150–400)
RBC: 2.66 MIL/uL — AB (ref 4.22–5.81)
RBC: 2.8 MIL/uL — AB (ref 4.22–5.81)
RDW: 12.3 % (ref 11.5–15.5)
RDW: 12.8 % (ref 11.5–15.5)
WBC: 9.2 10*3/uL (ref 4.0–10.5)
WBC: 9.7 10*3/uL (ref 4.0–10.5)

## 2016-07-27 LAB — GLUCOSE, CAPILLARY
GLUCOSE-CAPILLARY: 106 mg/dL — AB (ref 65–99)
GLUCOSE-CAPILLARY: 163 mg/dL — AB (ref 65–99)
GLUCOSE-CAPILLARY: 203 mg/dL — AB (ref 65–99)
Glucose-Capillary: 100 mg/dL — ABNORMAL HIGH (ref 65–99)
Glucose-Capillary: 104 mg/dL — ABNORMAL HIGH (ref 65–99)
Glucose-Capillary: 119 mg/dL — ABNORMAL HIGH (ref 65–99)
Glucose-Capillary: 136 mg/dL — ABNORMAL HIGH (ref 65–99)
Glucose-Capillary: 137 mg/dL — ABNORMAL HIGH (ref 65–99)
Glucose-Capillary: 145 mg/dL — ABNORMAL HIGH (ref 65–99)
Glucose-Capillary: 151 mg/dL — ABNORMAL HIGH (ref 65–99)
Glucose-Capillary: 66 mg/dL (ref 65–99)
Glucose-Capillary: 84 mg/dL (ref 65–99)
Glucose-Capillary: 87 mg/dL (ref 65–99)
Glucose-Capillary: 89 mg/dL (ref 65–99)

## 2016-07-27 LAB — HEMOGLOBIN A1C
HEMOGLOBIN A1C: 8 % — AB (ref 4.8–5.6)
MEAN PLASMA GLUCOSE: 183 mg/dL

## 2016-07-27 LAB — POCT I-STAT, CHEM 8
BUN: 11 mg/dL (ref 6–20)
Calcium, Ion: 1.14 mmol/L — ABNORMAL LOW (ref 1.15–1.40)
Chloride: 96 mmol/L — ABNORMAL LOW (ref 101–111)
Creatinine, Ser: 0.7 mg/dL (ref 0.61–1.24)
Glucose, Bld: 242 mg/dL — ABNORMAL HIGH (ref 65–99)
HEMATOCRIT: 26 % — AB (ref 39.0–52.0)
HEMOGLOBIN: 8.8 g/dL — AB (ref 13.0–17.0)
POTASSIUM: 4.3 mmol/L (ref 3.5–5.1)
SODIUM: 135 mmol/L (ref 135–145)
TCO2: 24 mmol/L (ref 0–100)

## 2016-07-27 LAB — CREATININE, SERUM
CREATININE: 0.82 mg/dL (ref 0.61–1.24)
GFR calc Af Amer: >60 mL/min (ref >60)

## 2016-07-27 LAB — BASIC METABOLIC PANEL
ANION GAP: 8 (ref 5–15)
BUN: 7 mg/dL (ref 6–20)
CO2: 24 mmol/L (ref 22–32)
Calcium: 7.3 mg/dL — ABNORMAL LOW (ref 8.9–10.3)
Chloride: 103 mmol/L (ref 101–111)
Creatinine, Ser: 0.62 mg/dL (ref 0.61–1.24)
GFR calc Af Amer: >60 mL/min (ref >60)
Glucose, Bld: 85 mg/dL (ref 65–99)
POTASSIUM: 4 mmol/L (ref 3.5–5.1)
SODIUM: 135 mmol/L (ref 135–145)

## 2016-07-27 LAB — MAGNESIUM
MAGNESIUM: 2.1 mg/dL (ref 1.7–2.4)
Magnesium: 2 mg/dL (ref 1.7–2.4)

## 2016-07-27 MED ORDER — DEXTROSE 50 % IV SOLN
14.0000 mL | INTRAVENOUS | Status: AC
  Administered 2016-07-27: 14 mL via INTRAVENOUS

## 2016-07-27 MED ORDER — INSULIN ASPART 100 UNIT/ML ~~LOC~~ SOLN: 0.0000 [IU] | SUBCUTANEOUS | Status: DC

## 2016-07-27 MED ORDER — DEXTROSE 50 % IV SOLN
INTRAVENOUS | Status: AC
  Filled 2016-07-27: qty 50

## 2016-07-27 MED ORDER — ENOXAPARIN SODIUM 40 MG/0.4ML ~~LOC~~ SOLN
40.0000 mg | Freq: Every day | SUBCUTANEOUS | Status: DC
  Administered 2016-07-27 – 2016-07-30 (×4): 40 mg via SUBCUTANEOUS
  Filled 2016-07-27 (×4): qty 0.4

## 2016-07-27 MED ORDER — INSULIN ASPART 100 UNIT/ML ~~LOC~~ SOLN
0.0000 [IU] | SUBCUTANEOUS | Status: DC
  Administered 2016-07-27: 8 [IU] via SUBCUTANEOUS
  Administered 2016-07-27 (×2): 2 [IU] via SUBCUTANEOUS
  Administered 2016-07-27: 4 [IU] via SUBCUTANEOUS
  Administered 2016-07-28: 8 [IU] via SUBCUTANEOUS
  Administered 2016-07-28 (×2): 4 [IU] via SUBCUTANEOUS

## 2016-07-27 NOTE — Progress Notes (Signed)
CT surgery p.m. Rounds  Patient examined and record reviewed.Hemodynamics stable,labs satisfactory.Patient had stable day.Continue current care. [name] [name] 07/27/2016   

## 2016-07-27 NOTE — Progress Notes (Addendum)
[address]       [place] [id number]             [phone]      1 Day Post-Op Procedure(s) (LRB): CORONARY ARTERY BYPASS GRAFTING (CABG), ON PUMP, TIMES FIVE, USING BILATERAL INTERNAL MAMMARY ARTERIES AND ENDOSCOPICALLY HARVESTED RIGHT GREATER SAPHENOUS VEIN (N/A) TRANSESOPHAGEAL ECHOCARDIOGRAM (TEE) (N/A)   Subjective:  No specific complaints.  Denies N/V,  Pain controlled with pain medications  Objective: Vital signs in last 24 hours: Temp:  [95.4 F (35.2 C)-99.7 F (37.6 C)] 99.5 F (37.5 C) (05/18 0700) Pulse Rate:  [93-100] 95 (05/18 0000) Cardiac Rhythm: Normal sinus rhythm (05/18 0700) Resp:  [9-39] 32 (05/18 0700) BP: (90-116)/(58-90) 116/68 (05/18 0700) SpO2:  [95 %-100 %] 99 % (05/18 0700) Arterial Line BP: (78-277)/(44-269) 120/53 (05/18 0700) FiO2 (%):  [40 %-50 %] 40 % (05/17 1750) Weight:  [187 lb 9.6 oz (85.1 kg)] 187 lb 9.6 oz (85.1 kg) (05/18 0456)  Hemodynamic parameters for last 24 hours: PAP: (16-31)/(3-20) 22/7 CO:  [4.8 L/min-6.3 L/min] 6.1 L/min CI:  [2.4 L/min/m2-3.2 L/min/m2] 3.1 L/min/m2  Intake/Output from previous day: 05/17 0701 - 05/18 0700 In: 7139.8 [P.O.:300; I.V.:3389.8; Blood:570; NG/GT:30; IV Piggyback:2850] Out: 6250 [Urine:3335; Emesis/NG output:25; Drains:120; Blood:1650; Chest Tube:1120]  General appearance: alert, cooperative and no distress Heart: regular rate and rhythm Lungs: clear to auscultation bilaterally Abdomen: soft, non-tender; bowel sounds normal; no masses,  no organomegaly Extremities: edema trace Wound: clean and dry  Lab Results:  Recent Labs  07/26/16 2013 07/26/16 2019 07/27/16 0406  WBC 10.5  --  9.2  HGB 8.5* 7.5* 8.5*  HCT 24.4* 22.0* 25.2*  PLT 164  --  159   BMET:  Recent Labs  07/26/16 0615  07/26/16 2019 07/27/16 0406  NA 136  < > 138 135  K 3.8  < > 4.4 4.0  CL 99*  < > 101 103  CO2 27  --   --  24  GLUCOSE 176*  < > 108* 85  BUN 20  < > 11 7    CREATININE 0.88  < > 0.60* 0.62  CALCIUM 9.1  --   --  7.3*  < > = values in this interval not displayed.  PT/INR:  Recent Labs  07/26/16 1500  LABPROT 17.5*  INR 1.42   ABG    Component Value Date/Time   PHART 7.329 (L) 07/26/2016 2015   HCO3 25.0 07/26/2016 2015   TCO2 25 07/26/2016 2019   ACIDBASEDEF 1.0 07/26/2016 2015   O2SAT 99.0 07/26/2016 2015   CBG (last 3)   Recent Labs  07/27/16 0409 07/27/16 0501 07/27/16 0536  GLUCAP 87 66 145*    Assessment/Plan: S/P Procedure(s) (LRB): CORONARY ARTERY BYPASS GRAFTING (CABG), ON PUMP, TIMES FIVE, USING BILATERAL INTERNAL MAMMARY ARTERIES AND ENDOSCOPICALLY HARVESTED RIGHT GREATER SAPHENOUS VEIN (N/A) TRANSESOPHAGEAL ECHOCARDIOGRAM (TEE) (N/A)  1. CV- NSR, not pacing, BP controlled- will start low dose BB 2. Pulm- no acute issues, wean oxygen as tolerated, mild atelectasis on CXR... Chest tube output is high will leave today 3. Renal- creatinine WNL, weight is mildly elevated, will give IV Lasix today 4. Expected post operative blood loss anemia- Hgb at 8.5 will monitor 5. CBGs controlled, off insulin, will not start Levemir 6. Dispo- patient stable, will leave chest tubes, start low dose BB, leave in ICU today   LOS: 1 day    [name] [name], [name] [name] 07/27/2016 Patient seen and examined, agree with above  Doing well, pain well controlled Advance diet  Viviann Spare C. Dorris Fetch, MD [facility] [phone]

## 2016-07-27 NOTE — Discharge Summary (Signed)
Physician Discharge Summary  Patient ID: [name] [name] MRN: [id number] DOB/AGE: August 22, 1970 46 y.o.  Admit date: 07/26/2016 Discharge date: 07/31/2016  Admission Diagnoses:  Patient Active Problem List   Diagnosis Date Noted  . Coronary artery disease involving native coronary artery of native heart with unstable angina pectoris (HCC)   . Type II diabetes mellitus (HCC)   . Hypercholesteremia   . Unstable angina (HCC) 07/19/2016  . Abnormal stress test 07/19/2016  . HTN (hypertension)   . Coronary artery calcification   . Anxiety    Discharge Diagnoses:   Patient Active Problem List   Diagnosis Date Noted  . 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 Coronary artery disease involving native coronary artery of native heart with unstable angina pectoris (HCC)   . Type II diabetes mellitus (HCC)   . Hypercholesteremia   . Unstable angina (HCC) 07/19/2016  . Abnormal stress test 07/19/2016  . HTN (hypertension)   . Coronary artery calcification   . Anxiety    Discharged Condition: good  History of Present:   [name] [name] is a 46 yo white male  with no previous history of coronary artery disease but risk factors notable for history of hypertension, poorly controlled insulin-dependent type 2 diabetes mellitus, hyperlipidemia, and a family history of coronary artery disease who has been referred for surgical consultation to discuss treatment options for management of severe multivessel coronary artery disease with new onset angina pectoris.  The patient states he was in his usual state of health until 2 or 3 weeks ago when he first began to notice symptoms of upper back pain and epigastric pain brought on with physical exertion and relieved by rest. He noticed this initially when he was exercising on a treadmill. Every time he would stop the symptoms would resolve. Symptoms increased in severity, prompting him to present to to the emergency department 07/16/2016. Initially ECG revealed sinus tachycardia  without acute changes. Troponins were negative. CT angiogram revealed no evidence of pulmonary embolism or aortic dissection but was notable for the presence of significant coronary calcification. The patient was admitted to the hospital and ruled out for myocardial infarction.    Hospital Course:   He underwent diagnostic cardiac catheterization demonstrating severe three-vessel coronary artery disease with normal left ventricular systolic function. Cardiothoracic surgical consultation was requested.  He was evaluated by Dr. Cornelius Moras on 07/20/2016 who was in agreement with patient benefiting from coronary bypass grafting procedure. The risks and benefits of the procedure were explained to the patient and he was agreeable to proceed.  He remained chest pain free prior to surgery.  He was taken to the operating room on 07/26/2016.  He underwent CABG x 5 utilizing LIMA to LAD, RIMA to Acute Marginal branch of RCA,  SVG to PDA, SVG to OM1, and SVG to Diagonal 2.  He also underwent endoscopic harvest of greater saphenous vein from right leg.  He tolerated the procedure without difficulty and was taken to the SICU in stable condition.  The patient was extubated the evening surgery.  During his stay in the SICU the patient did not require any drips.  His chest tube output was high and his chest tubes were removed on POD #2 after output decreased.  Post chest tube removal when patient got up he became orthostatic and suffered a near syncopal episode.  His BB was decreased and he was left in ICU for monitoring.  He had no further episodes and was ambulating independently and transferred to the telemetry unit  on POD #3.  The patient continues to make progress.  His heart rate is Sinus Tachycardia and he is Hypertensive with SBP in the 150s.  His beta blocker was increased to his home regimen on POD #4.  He tolerated this without difficulty.  His blood pressure remained elevated and he was restarted on his home Lisinopril on  POD #5.  His pacing wires were removed without difficulty.  He is diabetic with uncontrolled sugars at home.  His preoperative A1c is 8.0 and he will benefit from better glucose control.  He is ambulating independently.  His pain is well controlled.  He is felt medically stable for discharge home today.       Significant Diagnostic Studies: angiography:    Ost 1st Diag to 1st Diag lesion, 90 %stenosed.  Prox LAD lesion, 90 %stenosed.  Ost 1st Mrg to 1st Mrg lesion, 80 %stenosed.  Mid RCA lesion, 80 %stenosed.  LPDA lesion, 100 %stenosed. Left to left collaterals, right to left collaterals.  Mid LAD lesion, 80 %stenosed.  Ost 2nd Diag lesion, 90 %stenosed.  The left ventricular systolic function is normal.  LV end diastolic pressure is normal.  The left ventricular ejection fraction is 55-65% by visual estimate.  There is no aortic valve stenosis.   Severe native three vessel disease.  Left dominant system  Treatments: surgery:    Coronary Artery Bypass Grafting x 5              Left Internal Mammary Artery to Distal Left Anterior Descending Coronary Artery             Right Internal Mammary Artery to Acute Marginal Branch of Right Coronary Artery             Saphenous Vein Graft to Posterior Descending Coronary Artery             Saphenous Vein Graft to First Obtuse Marginal Branch of Left Circumflex Coronary Artery             Sapheonous Vein Graft to Second Diagonal Branch Coronary Artery             Endoscopic Vein Harvest from Right Thigh and Lower Leg  Disposition: 01-Home or Self Care   Discharge Medications:  The patient has been discharged on:   1.Beta Blocker:  Yes [ x  ]                              No   [   ]                              If No, reason:  2.Ace Inhibitor/ARB: Yes [ x  ]                                     No  [    ]                                     If No, reason:  3.Statin:   Yes [ x  ]                  No  [   ]  If No, reason:  4.Ecasa:  Yes  [x   ]                  No   [   ]                  If No, reason:      Allergies as of 07/31/2016   No Known Allergies     Medication List    STOP taking these medications   nitroGLYCERIN 0.4 MG SL tablet Commonly known as:  NITROSTAT     TAKE these medications   acetaminophen 500 MG tablet Commonly known as:  TYLENOL Take 2 tablets (1,000 mg total) by mouth every 6 (six) hours as needed.   aspirin 81 MG chewable tablet Chew 1 tablet (81 mg total) by mouth daily.   atorvastatin 40 MG tablet Commonly known as:  LIPITOR Take 1 tablet (40 mg total) by mouth daily.   clopidogrel 75 MG tablet Commonly known as:  PLAVIX Take 1 tablet (75 mg total) by mouth daily.   furosemide 40 MG tablet Commonly known as:  LASIX Take 1 tablet (40 mg total) by mouth daily. For 7 Days   glimepiride 1 MG tablet Commonly known as:  AMARYL Take 1 mg by mouth daily with breakfast.   insulin glargine 100 UNIT/ML injection Commonly known as:  LANTUS Inject 50 Units into the skin at bedtime.   lisinopril 5 MG tablet Commonly known as:  PRINIVIL,ZESTRIL Take 5 mg by mouth daily.   metFORMIN 1000 MG tablet Commonly known as:  GLUCOPHAGE Take 1,000 mg by mouth 2 (two) times daily with a meal.   metoprolol tartrate 25 MG tablet Commonly known as:  LOPRESSOR Take 1 tablet (25 mg total) by mouth 2 (two) times daily.   potassium chloride SA 20 MEQ tablet Commonly known as:  K-DUR,KLOR-CON Take 1 tablet (20 mEq total) by mouth 2 (two) times daily.   rOPINIRole 0.5 MG tablet Commonly known as:  REQUIP Take 0.25 mg by mouth at bedtime.   sertraline 100 MG tablet Commonly known as:  ZOLOFT Take 100 mg by mouth daily.   traMADol 50 MG tablet Commonly known as:  ULTRAM Take 1-2 tablets (50-100 mg total) by mouth every 4 (four) hours as needed for moderate pain.      Follow-up Information    [facility] [place]  Follow up on 08/27/2016.   Specialty:  Cardiothoracic Surgery Why:  Appoointment is at 2:00, please get CXR at 1:30 Contact information: [address], [address] [place] [id number] [phone]       Tereso NewcomerWeaver, Scott T, PA-C Follow up on 08/14/2016.   Specialties:  Cardiology, Physician Assistant Why:  Appointment is at 9:45 Contact information: [address] [place] [address] [phone]           Signed: Lowella DandyBARRETT, Jenel Gierke 07/31/2016, 7:55 AM

## 2016-07-27 NOTE — Care Management Note (Signed)
Case Management Note Madi Bonfiglio RN, BSN Unit 2W-Case Manager-- Donn Pierini2H coverage [phone]  Patient Details  Name: Marylou MccoyBrandon Marchant MRN: [id number] Date of Birth: 05/28/1970  Subjective/Objective:   Pt admitted s/p CABGx5 on 07/26/16                 Action/Plan: PTA pt lived at home with wife- anticipate return home- CM to follow for d/c needs  Expected Discharge Date:                  Expected Discharge Plan:  Home/Self Care  In-House Referral:     Discharge planning Services  CM Consult  Post Acute Care Choice:    Choice offered to:     DME Arranged:    DME Agency:     HH Arranged:    HH Agency:     Status of Service:  In process, will continue to follow  If discussed at Long Length of Stay Meetings, dates discussed:    Discharge Disposition:   Additional Comments:  Darrold SpanWebster, Reeves Musick Hall, RN 07/27/2016, 10:21 AM

## 2016-07-27 NOTE — Discharge Instructions (Signed)
Coronary Artery Bypass Grafting, Care After °This sheet gives you information about how to care for yourself after your procedure. Your health care provider may also give you more specific instructions. If you have problems or questions, contact your health care provider. °What can I expect after the procedure? °After the procedure, it is common to have: °· Nausea and a lack of appetite. °· Constipation. °· Weakness and fatigue. °· Depression or irritability. °· Pain or discomfort in your incision areas. °Follow these instructions at home: °Medicines  °· Take over-the-counter and prescription medicines only as told by your health care provider. Do not stop taking medicines or start any new medicines without approval from your health care provider. °· If you were prescribed an antibiotic medicine, take it as told by your health care provider. Do not stop taking the antibiotic even if you start to feel better. °· Do not drive or use heavy machinery while taking prescription pain medicine. °Incision care  °· Follow instructions from your health care provider about how to take care of your incisions. Make sure you: °¨ Wash your hands with soap and water before you change your bandage (dressing). If soap and water are not available, use hand sanitizer. °¨ Change your dressing as told by your health care provider. °¨ Leave stitches (sutures), skin glue, or adhesive strips in place. These skin closures may need to stay in place for 2 weeks or longer. If adhesive strip edges start to loosen and curl up, you may trim the loose edges. Do not remove adhesive strips completely unless your health care provider tells you to do that. °· Keep incision areas clean, dry, and protected. °· Check your incision areas every day for signs of infection. Check for: °¨ More redness, swelling, or pain. °¨ More fluid or blood. °¨ Warmth. °¨ Pus or a bad smell. °· If incisions were made in your legs: °¨ Avoid crossing your legs. °¨ Avoid  sitting for long periods of time. Change positions every 30 minutes. °¨ Raise (elevate) your legs when you are sitting. °Bathing  °· Do not take baths, swim, or use a hot tub until your health care provider approves. °· Only take sponge baths. Pat the incisions dry. Do not rub incisions with a washcloth or towel. °· Ask your health care provider when you can shower. °Eating and drinking  °· Eat foods that are high in fiber, such as raw fruits and vegetables, whole grains, beans, and nuts. Meats should be lean cut. Avoid canned, processed, and fried foods. This can help prevent constipation and is a recommended part of a heart-healthy diet. °· Drink enough fluid to keep your urine clear or pale yellow. °· Limit alcohol intake to no more than 1 drink a day for nonpregnant women and 2 drinks a day for men. One drink equals 12 oz of beer, 5 oz of wine, or 1½ oz of hard liquor. °Activity  °· Rest and limit your activity as told by your health care provider. You may be instructed to: °¨ Stop any activity right away if you have chest pain, shortness of breath, irregular heartbeats, or dizziness. Get help right away if you have any of these symptoms. °¨ Move around frequently for short periods or take short walks as directed by your health care provider. Gradually increase your activities. You may need physical therapy or cardiac rehabilitation to help strengthen your muscles and build your endurance. °¨ Avoid lifting, pushing, or pulling anything that is heavier than 10 lb (  4.5 kg) for at least 6 weeks or as told by your health care provider. °· Do not drive until your health care provider approves. °· Ask your health care provider when you may return to work. °· Ask your health care provider when you may resume sexual activity. °General instructions  °· Do not use any products that contain nicotine or tobacco, such as cigarettes and e-cigarettes. If you need help quitting, ask your health care provider. °· Take 2-3 deep  breaths every few hours during the day, while you recover. This helps expand your lungs and prevent complications like pneumonia after surgery. °· If you were given a device called an incentive spirometer, use it several times a day to practice deep breathing. Support your chest with a pillow or your arms when you take deep breaths or cough. °· Wear compression stockings as told by your health care provider. These stockings help to prevent blood clots and reduce swelling in your legs. °· Weigh yourself every day. This helps identify if your body is holding (retaining) fluid that may make your heart and lungs work harder. °· Keep all follow-up visits as told by your health care provider. This is important. °Contact a health care provider if: °· You have more redness, swelling, or pain around any incision. °· You have more fluid or blood coming from any incision. °· Any incision feels warm to the touch. °· You have pus or a bad smell coming from any incision °· You have a fever. °· You have swelling in your ankles or legs. °· You have pain in your legs. °· You gain 2 lb (0.9 kg) or more a day. °· You are nauseous or you vomit. °· You have diarrhea. °Get help right away if: °· You have chest pain that spreads to your jaw or arms. °· You are short of breath. °· You have a fast or irregular heartbeat. °· You notice a "clicking" in your breastbone (sternum) when you move. °· You have numbness or weakness in your arms or legs. °· You feel dizzy or light-headed. °Summary °· After the procedure, it is common to have pain or discomfort in the incision areas. °· Do not take baths, swim, or use a hot tub until your health care provider approves. °· Gradually increase your activities. You may need physical therapy or cardiac rehabilitation to help strengthen your muscles and build your endurance. °· Weigh yourself every day. This helps identify if your body is holding (retaining) fluid that may make your heart and lungs work  harder. °This information is not intended to replace advice given to you by your health care provider. Make sure you discuss any questions you have with your health care provider. °Document Released: 09/15/2004 Document Revised: 01/16/2016 Document Reviewed: 01/16/2016 °Elsevier Interactive Patient Education © 2017 [employer] Inc[employer] ° ° °Endoscopic Saphenous Vein Harvesting, Care After °Refer to this sheet in the next few weeks. These instructions provide you with information about caring for yourself after your procedure. Your health care provider may also give you more specific instructions. Your treatment has been planned according to current medical practices, but problems sometimes occur. Call your health care provider if you have any problems or questions after your procedure. °What can I expect after the procedure? °After the procedure, it is common to have: °· Pain. °· Bruising. °· Swelling. °· Numbness. °Follow these instructions at home: °Medicine  °· Take over-the-counter and prescription medicines only as told by your health care provider. °· Do not   drive or operate heavy machinery while taking prescription pain medicine. °Incision care  ° °· Follow instructions from your health care provider about how to take care of the cut made during surgery (incision). Make sure you: °¨ Wash your hands with soap and water before you change your bandage (dressing). If soap and water are not available, use hand sanitizer. °¨ Change your dressing as told by your health care provider. °¨ Leave stitches (sutures), skin glue, or adhesive strips in place. These skin closures may need to be in place for 2 weeks or longer. If adhesive strip edges start to loosen and curl up, you may trim the loose edges. Do not remove adhesive strips completely unless your health care provider tells you to do that. °· Check your incision area every day for signs of infection. Check for: °¨ More redness, swelling, or pain. °¨ More fluid or  blood. °¨ Warmth. °¨ Pus or a bad smell. °General instructions  °· Raise (elevate) your legs above the level of your heart while you are sitting or lying down. °· Do any exercises your health care providers have given you. These may include deep breathing, coughing, and walking exercises. °· Do not shower, take baths, swim, or use a hot tub unless told by your health care provider. °· Wear your elastic stocking if told by your health care provider. °· Keep all follow-up visits as told by your health care provider. This is important. °Contact a health care provider if: °· Medicine does not help your pain. °· Your pain gets worse. °· You have new leg bruises or your leg bruises get bigger. °· You have a fever. °· Your leg feels numb. °· You have more redness, swelling, or pain around your incision. °· You have more fluid or blood coming from your incision. °· Your incision feels warm to the touch. °· You have pus or a bad smell coming from your incision. °Get help right away if: °· Your pain is severe. °· You develop pain, tenderness, warmth, redness, or swelling in any part of your leg. °· You have chest pain. °· You have trouble breathing. °This information is not intended to replace advice given to you by your health care provider. Make sure you discuss any questions you have with your health care provider. °Document Released: 11/08/2010 Document Revised: 08/04/2015 Document Reviewed: 01/10/2015 °Elsevier Interactive Patient Education © 2017 Elsevier Inc. ° ° °

## 2016-07-27 NOTE — Progress Notes (Signed)
07/27/2016 0830 Verbal order Lowella DandyErin Barrett PAC, stop insulin gtt at this time, initiate q4h cbg coverage, no basal insulin at this time. Orders enacted. Will continue to closely monitor patient.  Roanna Reaves, Blanchard KelchStephanie Ingold

## 2016-07-28 ENCOUNTER — Inpatient Hospital Stay (HOSPITAL_COMMUNITY): Payer: 59

## 2016-07-28 LAB — GLUCOSE, CAPILLARY
GLUCOSE-CAPILLARY: 164 mg/dL — AB (ref 65–99)
GLUCOSE-CAPILLARY: 230 mg/dL — AB (ref 65–99)
GLUCOSE-CAPILLARY: 270 mg/dL — AB (ref 65–99)
Glucose-Capillary: 165 mg/dL — ABNORMAL HIGH (ref 65–99)
Glucose-Capillary: 146 mg/dL — ABNORMAL HIGH (ref 65–99)
Glucose-Capillary: 268 mg/dL — ABNORMAL HIGH (ref 65–99)

## 2016-07-28 LAB — BASIC METABOLIC PANEL
ANION GAP: 7 (ref 5–15)
BUN: 9 mg/dL (ref 6–20)
CALCIUM: 7.8 mg/dL — AB (ref 8.9–10.3)
CO2: 27 mmol/L (ref 22–32)
Chloride: 100 mmol/L — ABNORMAL LOW (ref 101–111)
Creatinine, Ser: 0.68 mg/dL (ref 0.61–1.24)
GFR calc non Af Amer: >60 mL/min (ref >60)
GLUCOSE: 150 mg/dL — AB (ref 65–99)
POTASSIUM: 4 mmol/L (ref 3.5–5.1)
Sodium: 134 mmol/L — ABNORMAL LOW (ref 135–145)

## 2016-07-28 LAB — CBC
HEMATOCRIT: 25.6 % — AB (ref 39.0–52.0)
HEMOGLOBIN: 8.4 g/dL — AB (ref 13.0–17.0)
MCH: 31.8 pg (ref 26.0–34.0)
MCHC: 32.8 g/dL (ref 30.0–36.0)
MCV: 97 fL (ref 78.0–100.0)
Platelets: 156 10*3/uL (ref 150–400)
RBC: 2.64 MIL/uL — AB (ref 4.22–5.81)
RDW: 12.6 % (ref 11.5–15.5)
WBC: 8.9 10*3/uL (ref 4.0–10.5)

## 2016-07-28 MED ORDER — SODIUM CHLORIDE 0.9% FLUSH: 3.0000 mL | INTRAVENOUS | Status: DC | PRN

## 2016-07-28 MED ORDER — SODIUM CHLORIDE 0.9% FLUSH
3.0000 mL | Freq: Two times a day (BID) | INTRAVENOUS | Status: DC
  Administered 2016-07-28 – 2016-07-29 (×2): 3 mL via INTRAVENOUS

## 2016-07-28 MED ORDER — MAGNESIUM HYDROXIDE 400 MG/5ML PO SUSP: 30.0000 mL | Freq: Every day | ORAL | Status: DC | PRN

## 2016-07-28 MED ORDER — METOPROLOL TARTRATE 12.5 MG HALF TABLET
12.5000 mg | ORAL_TABLET | Freq: Two times a day (BID) | ORAL | Status: DC
  Administered 2016-07-28 – 2016-07-29 (×3): 12.5 mg via ORAL
  Filled 2016-07-28 (×3): qty 1

## 2016-07-28 MED ORDER — MOVING RIGHT ALONG BOOK
Freq: Once | Status: AC
  Administered 2016-07-28: 10:00:00
  Filled 2016-07-28: qty 1

## 2016-07-28 MED ORDER — INSULIN ASPART 100 UNIT/ML ~~LOC~~ SOLN
0.0000 [IU] | Freq: Three times a day (TID) | SUBCUTANEOUS | Status: DC
  Administered 2016-07-28: 12 [IU] via SUBCUTANEOUS
  Administered 2016-07-28: 8 [IU] via SUBCUTANEOUS
  Administered 2016-07-28: 12 [IU] via SUBCUTANEOUS
  Administered 2016-07-29 (×3): 8 [IU] via SUBCUTANEOUS
  Administered 2016-07-30: 2 [IU] via SUBCUTANEOUS
  Administered 2016-07-30: 4 [IU] via SUBCUTANEOUS
  Administered 2016-07-30: 8 [IU] via SUBCUTANEOUS
  Administered 2016-07-30: 12 [IU] via SUBCUTANEOUS
  Administered 2016-07-31: 4 [IU] via SUBCUTANEOUS

## 2016-07-28 MED ORDER — FUROSEMIDE 10 MG/ML IJ SOLN
20.0000 mg | Freq: Once | INTRAMUSCULAR | Status: AC
  Administered 2016-07-28: 20 mg via INTRAVENOUS
  Filled 2016-07-28: qty 2

## 2016-07-28 MED ORDER — INSULIN GLARGINE 100 UNIT/ML ~~LOC~~ SOLN
30.0000 [IU] | Freq: Every day | SUBCUTANEOUS | Status: DC
  Administered 2016-07-28 – 2016-07-30 (×3): 30 [IU] via SUBCUTANEOUS
  Filled 2016-07-28 (×3): qty 0.3

## 2016-07-28 MED ORDER — SODIUM CHLORIDE 0.9 % IV SOLN: 250.0000 mL | INTRAVENOUS | Status: DC | PRN

## 2016-07-28 MED ORDER — FUROSEMIDE 40 MG PO TABS
40.0000 mg | ORAL_TABLET | Freq: Every day | ORAL | Status: DC
  Administered 2016-07-29 – 2016-07-30 (×2): 40 mg via ORAL
  Filled 2016-07-28 (×3): qty 1

## 2016-07-28 MED ORDER — POTASSIUM CHLORIDE CRYS ER 20 MEQ PO TBCR
20.0000 meq | EXTENDED_RELEASE_TABLET | Freq: Two times a day (BID) | ORAL | Status: DC
  Administered 2016-07-28 – 2016-07-31 (×7): 20 meq via ORAL
  Filled 2016-07-28 (×7): qty 1

## 2016-07-28 MED ORDER — METOPROLOL TARTRATE 25 MG PO TABS
25.0000 mg | ORAL_TABLET | Freq: Two times a day (BID) | ORAL | Status: DC
  Administered 2016-07-28: 25 mg via ORAL
  Filled 2016-07-28: qty 1

## 2016-07-28 NOTE — Progress Notes (Addendum)
Patient was in bed, assisted to side of bed in order to prepare for ambulation. Patient stood up at bedside and c/o being dizzy. BP was cycled 73/53 and patient became pale and started to fall back onto bed, was assisted onto the bed. Patient posture tense and not talking.  Assistance arrived. Once patient placed back in bed, BP increased, patient was able to answer all questions, no neuro deficits noted. Dr. Donata ClayVan Trigt paged. Per Dr. Donata ClayVan Trigt cancel transfer order, CXR and modify metoprolol order. Will implement and continue to monitor.

## 2016-07-28 NOTE — Progress Notes (Signed)
CT surgery p.m. Rounds Transfer to stepdown delayed today Patient became orthostatic and dizzy after standing following removal of chest tubes Blood sugar was checked and was normal Chest x-ray performed showing no changes other than mild basilar atelectasis Maintaining sinus rhythm He is now completely recovered and has walked the entire unit We'll probably transfer to 2 W. tomorrow

## 2016-07-28 NOTE — Progress Notes (Signed)
2 Days Post-Op Procedure(s) (LRB): CORONARY ARTERY BYPASS GRAFTING (CABG), ON PUMP, TIMES FIVE, USING BILATERAL INTERNAL MAMMARY ARTERIES AND ENDOSCOPICALLY HARVESTED RIGHT GREATER SAPHENOUS VEIN (N/A) TRANSESOPHAGEAL ECHOCARDIOGRAM (TEE) (N/A) Subjective: cabg with BIMA Doing well  Needs lasix for wt gain ready for transfer 2 west  Objective: Vital signs in last 24 hours: Temp:  [98.6 F (37 C)-99.5 F (37.5 C)] 99.1 F (37.3 C) (05/19 [id number]) Cardiac Rhythm: Normal sinus rhythm (05/19 0800) Resp:  [17-43] 19 (05/19 0800) BP: (94-128)/(62-74) 118/74 (05/19 0800) SpO2:  [95 %-100 %] 99 % (05/19 0800) Weight:  [188 lb 6.4 oz (85.5 kg)] 188 lb 6.4 oz (85.5 kg) (05/19 0500)  Hemodynamic parameters for last 24 hours:  nsr  Intake/Output from previous day: 05/18 0701 - 05/19 0700 In: 1907.5 [P.O.:1320; I.V.:487.5; IV Piggyback:100] Out: 9604 [[id number] [Urine:2475; Drains:13; Chest Tube:840] Intake/Output this shift: Total I/O In: 20 [I.V.:20] Out: 88 [Urine:65; Drains:3; Chest Tube:20]  Neuro intact No airleak cxr clear  Lab Results:  Recent Labs  07/27/16 1700 07/27/16 1703 07/28/16 0426  WBC 9.7  --  8.9  HGB 9.2* 8.8* 8.4*  HCT 26.4* 26.0* 25.6*  PLT 155  --  156   BMET:  Recent Labs  07/27/16 0406  07/27/16 1703 07/28/16 0426  NA 135  --  135 134*  K 4.0  --  4.3 4.0  CL 103  --  96* 100*  CO2 24  --   --  27  GLUCOSE 85  --  242* 150*  BUN 7  --  11 9  CREATININE 0.62  < > 0.70 0.68  CALCIUM 7.3*  --   --  7.8*  < > = values in this interval not displayed.  PT/INR:  Recent Labs  07/26/16 1500  LABPROT 17.5*  INR 1.42   ABG    Component Value Date/Time   PHART 7.329 (L) 07/26/2016 2015   HCO3 25.0 07/26/2016 2015   TCO2 24 07/27/2016 1703   ACIDBASEDEF 1.0 07/26/2016 2015   O2SAT 99.0 07/26/2016 2015   CBG (last 3)   Recent Labs  07/28/16 0310 07/28/16 0829 07/28/16 0914  GLUCAP 164* 165* 146*    Assessment/Plan: S/P Procedure(s)  (LRB): CORONARY ARTERY BYPASS GRAFTING (CABG), ON PUMP, TIMES FIVE, USING BILATERAL INTERNAL MAMMARY ARTERIES AND ENDOSCOPICALLY HARVESTED RIGHT GREATER SAPHENOUS VEIN (N/A) TRANSESOPHAGEAL ECHOCARDIOGRAM (TEE) (N/A) Mobilize Diuresis Diabetes control See transfer orders  LOS: 2 days    Kathlee Nationseter Van Trigt III 07/28/2016

## 2016-07-29 ENCOUNTER — Inpatient Hospital Stay (HOSPITAL_COMMUNITY): Payer: 59

## 2016-07-29 LAB — BASIC METABOLIC PANEL
Anion gap: 5 (ref 5–15)
BUN: 13 mg/dL (ref 6–20)
CO2: 29 mmol/L (ref 22–32)
Calcium: 8 mg/dL — ABNORMAL LOW (ref 8.9–10.3)
Chloride: 100 mmol/L — ABNORMAL LOW (ref 101–111)
Creatinine, Ser: 0.75 mg/dL (ref 0.61–1.24)
GFR calc Af Amer: >60 mL/min (ref >60)
GFR calc non Af Amer: >60 mL/min (ref >60)
Glucose, Bld: 155 mg/dL — ABNORMAL HIGH (ref 65–99)
Potassium: 4 mmol/L (ref 3.5–5.1)
Sodium: 134 mmol/L — ABNORMAL LOW (ref 135–145)

## 2016-07-29 LAB — CBC
HCT: 23.8 % — ABNORMAL LOW (ref 39.0–52.0)
Hemoglobin: 8.2 g/dL — ABNORMAL LOW (ref 13.0–17.0)
MCH: 32.8 pg (ref 26.0–34.0)
MCHC: 34.5 g/dL (ref 30.0–36.0)
MCV: 95.2 fL (ref 78.0–100.0)
Platelets: 164 10*3/uL (ref 150–400)
RBC: 2.5 MIL/uL — ABNORMAL LOW (ref 4.22–5.81)
RDW: 12.2 % (ref 11.5–15.5)
WBC: 9 10*3/uL (ref 4.0–10.5)

## 2016-07-29 LAB — GLUCOSE, CAPILLARY
GLUCOSE-CAPILLARY: 246 mg/dL — AB (ref 65–99)
GLUCOSE-CAPILLARY: 288 mg/dL — AB (ref 65–99)
GLUCOSE-CAPILLARY: 235 mg/dL — AB (ref 65–99)
Glucose-Capillary: 108 mg/dL — ABNORMAL HIGH (ref 65–99)

## 2016-07-29 MED ORDER — FE FUMARATE-B12-VIT C-FA-IFC PO CAPS
1.0000 | ORAL_CAPSULE | Freq: Three times a day (TID) | ORAL | Status: DC
  Administered 2016-07-29 – 2016-07-31 (×7): 1 via ORAL
  Filled 2016-07-29 (×6): qty 1

## 2016-07-29 NOTE — Progress Notes (Signed)
07/29/2016 1530 Received pt to room 2W23 from 2H.  Pt is A&O, no C/O voiced.  Tele monitor applied and CCMD notified.  Oriented to room, call light and bed.  Call light and family at bedside. Kathryne HitchAllen, Lanny Lipkin C

## 2016-07-29 NOTE — Progress Notes (Signed)
Report given and pt transferred to rm 2w23 at this time, accompanied by wife.  Pt has no s/s of any acute distress.  No c/o pain.  Ambulated from 2H to 2W without difficulty.

## 2016-07-29 NOTE — Progress Notes (Signed)
3 Days Post-Op Procedure(s) (LRB): CORONARY ARTERY BYPASS GRAFTING (CABG), ON PUMP, TIMES FIVE, USING BILATERAL INTERNAL MAMMARY ARTERIES AND ENDOSCOPICALLY HARVESTED RIGHT GREATER SAPHENOUS VEIN (N/A) TRANSESOPHAGEAL ECHOCARDIOGRAM (TEE) (N/A) Subjective: Doing well -  Transfer to 2 west cxr clear Glucose well controlled  Objective: Vital signs in last 24 hours: Temp:  [98.5 F (36.9 C)-99.6 F (37.6 C)] 98.5 F (36.9 C) (05/20 0818) Pulse Rate:  [104-113] 104 (05/20 0800) Cardiac Rhythm: Sinus tachycardia (05/20 0900) Resp:  [0-33] 8 (05/20 0900) BP: (73-134)/(53-89) 125/72 (05/20 0900) SpO2:  [92 %-100 %] 92 % (05/20 0900) Weight:  [180 lb 5.4 oz (81.8 kg)] 180 lb 5.4 oz (81.8 kg) (05/20 0500)  Hemodynamic parameters for last 24 hours:   stable Intake/Output from previous day: 05/19 0701 - 05/20 0700 In: 1380 [P.O.:1320; I.V.:60] Out: 1753 [Urine:1730; Drains:3; Chest Tube:20] Intake/Output this shift: Total I/O In: -  Out: 275 [Urine:275]       Exam    General- alert and comfortable, sternotomy clean,dry   Lungs- clear without rales, wheezes   Cor- regular rate and rhythm, no murmur , gallop   Abdomen- soft, non-tender   Extremities - warm, non-tender, minimal edema   Neuro- oriented, appropriate, no focal weakness   Lab Results:  Recent Labs  07/28/16 0426 07/29/16 0228  WBC 8.9 9.0  HGB 8.4* 8.2*  HCT 25.6* 23.8*  PLT 156 164   BMET:  Recent Labs  07/28/16 0426 07/29/16 0228  NA 134* 134*  K 4.0 4.0  CL 100* 100*  CO2 27 29  GLUCOSE 150* 155*  BUN 9 13  CREATININE 0.68 0.75  CALCIUM 7.8* 8.0*    PT/INR:  Recent Labs  07/26/16 1500  LABPROT 17.5*  INR 1.42   ABG    Component Value Date/Time   PHART 7.329 (L) 07/26/2016 2015   HCO3 25.0 07/26/2016 2015   TCO2 24 07/27/2016 1703   ACIDBASEDEF 1.0 07/26/2016 2015   O2SAT 99.0 07/26/2016 2015   CBG (last 3)   Recent Labs  07/28/16 1622 07/28/16 2140 07/29/16 0816  GLUCAP  268* 270* 108*    Assessment/Plan: S/P Procedure(s) (LRB): CORONARY ARTERY BYPASS GRAFTING (CABG), ON PUMP, TIMES FIVE, USING BILATERAL INTERNAL MAMMARY ARTERIES AND ENDOSCOPICALLY HARVESTED RIGHT GREATER SAPHENOUS VEIN (N/A) TRANSESOPHAGEAL ECHOCARDIOGRAM (TEE) (N/A) Mobilize Diuresis Plan for transfer to step-down: see transfer orders   LOS: 3 days    [name] [name] 07/29/2016

## 2016-07-30 LAB — CBC
HCT: 25.2 % — ABNORMAL LOW (ref 39.0–52.0)
Hemoglobin: 8.3 g/dL — ABNORMAL LOW (ref 13.0–17.0)
MCH: 31.7 pg (ref 26.0–34.0)
MCHC: 32.9 g/dL (ref 30.0–36.0)
MCV: 96.2 fL (ref 78.0–100.0)
Platelets: 240 10*3/uL (ref 150–400)
RBC: 2.62 MIL/uL — ABNORMAL LOW (ref 4.22–5.81)
RDW: 12.3 % (ref 11.5–15.5)
WBC: 7.2 10*3/uL (ref 4.0–10.5)

## 2016-07-30 LAB — BASIC METABOLIC PANEL
Anion gap: 9 (ref 5–15)
BUN: 16 mg/dL (ref 6–20)
CO2: 30 mmol/L (ref 22–32)
Calcium: 8.2 mg/dL — ABNORMAL LOW (ref 8.9–10.3)
Chloride: 99 mmol/L — ABNORMAL LOW (ref 101–111)
Creatinine, Ser: 0.72 mg/dL (ref 0.61–1.24)
GFR calc Af Amer: >60 mL/min (ref >60)
GFR calc non Af Amer: >60 mL/min (ref >60)
Glucose, Bld: 187 mg/dL — ABNORMAL HIGH (ref 65–99)
Potassium: 4.2 mmol/L (ref 3.5–5.1)
Sodium: 138 mmol/L (ref 135–145)

## 2016-07-30 LAB — GLUCOSE, CAPILLARY
GLUCOSE-CAPILLARY: 195 mg/dL — AB (ref 65–99)
GLUCOSE-CAPILLARY: 222 mg/dL — AB (ref 65–99)
Glucose-Capillary: 203 mg/dL — ABNORMAL HIGH (ref 65–99)
Glucose-Capillary: 264 mg/dL — ABNORMAL HIGH (ref 65–99)
Glucose-Capillary: 160 mg/dL — ABNORMAL HIGH (ref 65–99)

## 2016-07-30 MED ORDER — METFORMIN HCL 500 MG PO TABS
1000.0000 mg | ORAL_TABLET | Freq: Two times a day (BID) | ORAL | Status: DC
  Administered 2016-07-30 (×2): 1000 mg via ORAL
  Filled 2016-07-30 (×2): qty 2

## 2016-07-30 MED ORDER — METOPROLOL TARTRATE 25 MG PO TABS
25.0000 mg | ORAL_TABLET | Freq: Two times a day (BID) | ORAL | Status: DC
  Administered 2016-07-30 (×2): 25 mg via ORAL
  Filled 2016-07-30 (×2): qty 1

## 2016-07-30 MED ORDER — ASPIRIN EC 81 MG PO TBEC
81.0000 mg | DELAYED_RELEASE_TABLET | Freq: Every day | ORAL | Status: DC
  Administered 2016-07-30: 81 mg via ORAL
  Filled 2016-07-30: qty 1

## 2016-07-30 MED ORDER — LACTULOSE 10 GM/15ML PO SOLN: 20.0000 g | Freq: Every day | ORAL | Status: DC | PRN

## 2016-07-30 MED ORDER — CLOPIDOGREL BISULFATE 75 MG PO TABS
75.0000 mg | ORAL_TABLET | Freq: Every day | ORAL | Status: DC
  Administered 2016-07-30 – 2016-07-31 (×2): 75 mg via ORAL
  Filled 2016-07-30 (×2): qty 1

## 2016-07-30 MED ORDER — ATORVASTATIN CALCIUM 80 MG PO TABS
80.0000 mg | ORAL_TABLET | Freq: Every day | ORAL | Status: DC
  Administered 2016-07-30: 80 mg via ORAL
  Filled 2016-07-30: qty 1

## 2016-07-30 MED ORDER — LISINOPRIL 5 MG PO TABS: 5.0000 mg | ORAL_TABLET | Freq: Every day | ORAL | Status: DC

## 2016-07-30 NOTE — Progress Notes (Addendum)
      [address]       [place] [id number]             [phone]      4 Days Post-Op Procedure(s) (LRB): CORONARY ARTERY BYPASS GRAFTING (CABG), ON PUMP, TIMES FIVE, USING BILATERAL INTERNAL MAMMARY ARTERIES AND ENDOSCOPICALLY HARVESTED RIGHT GREATER SAPHENOUS VEIN (N/A) TRANSESOPHAGEAL ECHOCARDIOGRAM (TEE) (N/A)   Subjective:  Patient has no complaints.  Denies pain, shortness of breath, N/V, denies further dizziness... No BM  Objective: Vital signs in last 24 hours: Temp:  [98.5 F (36.9 C)-99.6 F (37.6 C)] 99 F (37.2 C) (05/21 0551) Pulse Rate:  [104-113] 112 (05/21 0551) Cardiac Rhythm: Sinus tachycardia (05/21 0735) Resp:  [8-27] 17 (05/21 0551) BP: (96-146)/(59-82) 146/75 (05/21 0551) SpO2:  [92 %-96 %] 93 % (05/21 0551) Weight:  [178 lb (80.7 kg)] 178 lb (80.7 kg) (05/21 0551)  Intake/Output from previous day: 05/20 0701 - 05/21 0700 In: 480 [P.O.:480] Out: 1600 [Urine:1600]  General appearance: alert, cooperative and no distress Heart: regular rate and rhythm and tachy Lungs: clear to auscultation bilaterally Abdomen: soft, non-tender; bowel sounds normal; no masses,  no organomegaly Extremities: edema 1+ Wound: clean and dry  Lab Results:  Recent Labs  07/29/16 0228 07/30/16 0316  WBC 9.0 7.2  HGB 8.2* 8.3*  HCT 23.8* 25.2*  PLT 164 240   BMET:  Recent Labs  07/29/16 0228 07/30/16 0316  NA 134* 138  K 4.0 4.2  CL 100* 99*  CO2 29 30  GLUCOSE 155* 187*  BUN 13 16  CREATININE 0.75 0.72  CALCIUM 8.0* 8.2*    PT/INR: No results for input(s): LABPROT, INR in the last 72 hours. ABG    Component Value Date/Time   PHART 7.329 (L) 07/26/2016 2015   HCO3 25.0 07/26/2016 2015   TCO2 24 07/27/2016 1703   ACIDBASEDEF 1.0 07/26/2016 2015   O2SAT 99.0 07/26/2016 2015   CBG (last 3)   Recent Labs  07/29/16 1730 07/29/16 2135 07/30/16 0607  GLUCAP 288* 235* 195*    Assessment/Plan: S/P Procedure(s) (LRB): CORONARY  ARTERY BYPASS GRAFTING (CABG), ON PUMP, TIMES FIVE, USING BILATERAL INTERNAL MAMMARY ARTERIES AND ENDOSCOPICALLY HARVESTED RIGHT GREATER SAPHENOUS VEIN (N/A) TRANSESOPHAGEAL ECHOCARDIOGRAM (TEE) (N/A)  1. CV- Sinus Tach, + HTN- will increase Lopressor to 25 mg BID.Marland Kitchen. Went orthostatic in ICU will monitor BP if tolerates increase BB will restart home Lisinopril tomorrow if BP remains elevated 2. Pulm- no acute issues, continue IS 3. Renal- creatinine stable, weight is trending down, continue Lasix, potassium supplement ordered 4. Expected blood loss anemia, Hgb at 8.3 5. DM- sugars remain elevated over 200 at times- will continue Levemir, restart home Metformin 6. Dispo- patient stable, increase BB for tachycardia, HTN... If tolerates will restart home ACE tomorrow, restart Metformin for better glucose control, d/c EPW... If remains stable possibly ready for d/c in AM   LOS: 4 days    [name], [name] 07/30/2016   I have seen and examined the patient and agree with the assessment and plan as outlined.  Purcell Nailslarence H Alena Blankenbeckler, MD 07/30/2016 8:55 AM

## 2016-07-30 NOTE — Progress Notes (Signed)
CARDIAC REHAB PHASE I   PRE:  Rate/Rhythm: 120 ST  BP:  Sitting: 150/79        SaO2: 92 RA  MODE:  Ambulation: 700 ft   POST:  Rate/Rhythm: 128 ST  BP:  Sitting: 138/78         SaO2: 95 RA  Pt ambulated 700 ft on RA, hand held assist, steady gait, tolerated well with no complaints. Pt HR somewhat elevated this morning, states he is generally anxious. Encouraged IS, additional ambulation x2 today. Pt to recliner after walk, call bell within reach, wife at bedside. Will follow.   [id number] Joylene GrapesEmily C Mayrin Schmuck, RN, BSN 07/30/2016 8:58 AM

## 2016-07-30 NOTE — Progress Notes (Signed)
Removed epicardial pacing wires per MD order. 2 sutures removed. Sites cleansed. Steri strips placed. Vitals WNL. Bedrest for one hour. Will continue to monitor.  Minerva Endsiffany N Motty Borin RN

## 2016-07-31 LAB — GLUCOSE, CAPILLARY
GLUCOSE-CAPILLARY: 182 mg/dL — AB (ref 65–99)
GLUCOSE-CAPILLARY: 161 mg/dL — AB (ref 65–99)

## 2016-07-31 MED ORDER — ATORVASTATIN CALCIUM 80 MG PO TABS
80.0000 mg | ORAL_TABLET | Freq: Every day | ORAL | Status: DC
  Administered 2016-07-31: 80 mg via ORAL
  Filled 2016-07-31: qty 1

## 2016-07-31 MED ORDER — ROPINIROLE HCL 0.25 MG PO TABS
0.2500 mg | ORAL_TABLET | Freq: Every day | ORAL | Status: DC
Start: 2016-07-31 — End: 2016-07-31

## 2016-07-31 MED ORDER — LISINOPRIL 5 MG PO TABS
5.0000 mg | ORAL_TABLET | Freq: Every day | ORAL | Status: DC
  Administered 2016-07-31: 5 mg via ORAL
  Filled 2016-07-31: qty 1

## 2016-07-31 MED ORDER — GLIMEPIRIDE 1 MG PO TABS
1.0000 mg | ORAL_TABLET | Freq: Every day | ORAL | Status: DC
  Administered 2016-07-31: 1 mg via ORAL
  Filled 2016-07-31: qty 1

## 2016-07-31 MED ORDER — METFORMIN HCL 500 MG PO TABS
1000.0000 mg | ORAL_TABLET | Freq: Two times a day (BID) | ORAL | Status: DC
  Administered 2016-07-31: 1000 mg via ORAL
  Filled 2016-07-31: qty 2

## 2016-07-31 MED ORDER — POTASSIUM CHLORIDE CRYS ER 20 MEQ PO TBCR: 20.0000 meq | EXTENDED_RELEASE_TABLET | Freq: Two times a day (BID) | ORAL | 0 refills | Status: DC

## 2016-07-31 MED ORDER — TRAMADOL HCL 50 MG PO TABS: 50.0000 mg | ORAL_TABLET | ORAL | 0 refills | Status: DC | PRN

## 2016-07-31 MED ORDER — FUROSEMIDE 40 MG PO TABS
40.0000 mg | ORAL_TABLET | Freq: Every day | ORAL | 0 refills | Status: DC
Start: 2016-07-31 — End: 2016-08-14

## 2016-07-31 MED ORDER — CLOPIDOGREL BISULFATE 75 MG PO TABS: 75.0000 mg | ORAL_TABLET | Freq: Every day | ORAL | 3 refills | Status: DC

## 2016-07-31 MED ORDER — ASPIRIN 81 MG PO CHEW
81.0000 mg | CHEWABLE_TABLET | Freq: Every day | ORAL | Status: DC
  Administered 2016-07-31: 81 mg via ORAL
  Filled 2016-07-31: qty 1

## 2016-07-31 MED ORDER — METOPROLOL TARTRATE 25 MG PO TABS
25.0000 mg | ORAL_TABLET | Freq: Two times a day (BID) | ORAL | Status: DC
  Administered 2016-07-31: 25 mg via ORAL
  Filled 2016-07-31: qty 1

## 2016-07-31 MED ORDER — INSULIN GLARGINE 100 UNIT/ML ~~LOC~~ SOLN: 50.0000 [IU] | Freq: Every day | SUBCUTANEOUS | Status: DC

## 2016-07-31 MED ORDER — ACETAMINOPHEN 500 MG PO TABS: 1000.0000 mg | ORAL_TABLET | Freq: Four times a day (QID) | ORAL | 0 refills | Status: AC | PRN

## 2016-07-31 NOTE — Progress Notes (Addendum)
      [address]       [place] [id number]             [phone]      5 Days Post-Op Procedure(s) (LRB): CORONARY ARTERY BYPASS GRAFTING (CABG), ON PUMP, TIMES FIVE, USING BILATERAL INTERNAL MAMMARY ARTERIES AND ENDOSCOPICALLY HARVESTED RIGHT GREATER SAPHENOUS VEIN (N/A) TRANSESOPHAGEAL ECHOCARDIOGRAM (TEE) (N/A)   Subjective:  No complaints.  Feels much better.... + ambulation  + BM  Objective: Vital signs in last 24 hours: Temp:  [98.5 F (36.9 C)-98.9 F (37.2 C)] 98.5 F (36.9 C) (05/22 0620) Pulse Rate:  [102-113] 106 (05/22 0620) Cardiac Rhythm: Sinus tachycardia (05/21 1900) Resp:  [17-18] 17 (05/22 0620) BP: (121-153)/(76-88) 149/88 (05/22 0620) SpO2:  [94 %-95 %] 95 % (05/22 0620) Weight:  [176 lb 14.4 oz (80.2 kg)] 176 lb 14.4 oz (80.2 kg) (05/22 0620)  Intake/Output from previous day: 05/21 0701 - 05/22 0700 In: 960 [P.O.:960] Out: 1200 [Urine:1200]  General appearance: alert, cooperative and no distress Heart: regular rate and rhythm Lungs: clear to auscultation bilaterally Abdomen: soft, non-tender; bowel sounds normal; no masses,  no organomegaly Extremities: edema trace Wound: clean and dry  Lab Results:  Recent Labs  07/29/16 0228 07/30/16 0316  WBC 9.0 7.2  HGB 8.2* 8.3*  HCT 23.8* 25.2*  PLT 164 240   BMET:  Recent Labs  07/29/16 0228 07/30/16 0316  NA 134* 138  K 4.0 4.2  CL 100* 99*  CO2 29 30  GLUCOSE 155* 187*  BUN 13 16  CREATININE 0.75 0.72  CALCIUM 8.0* 8.2*    PT/INR: No results for input(s): LABPROT, INR in the last 72 hours. ABG    Component Value Date/Time   PHART 7.329 (L) 07/26/2016 2015   HCO3 25.0 07/26/2016 2015   TCO2 24 07/27/2016 1703   ACIDBASEDEF 1.0 07/26/2016 2015   O2SAT 99.0 07/26/2016 2015   CBG (last 3)   Recent Labs  07/30/16 1643 07/30/16 2005 07/31/16 0627  GLUCAP 160* 222* 182*    Assessment/Plan: S/P Procedure(s) (LRB): CORONARY ARTERY BYPASS GRAFTING (CABG),  ON PUMP, TIMES FIVE, USING BILATERAL INTERNAL MAMMARY ARTERIES AND ENDOSCOPICALLY HARVESTED RIGHT GREATER SAPHENOUS VEIN (N/A) TRANSESOPHAGEAL ECHOCARDIOGRAM (TEE) (N/A)  1. CV- Sinus Tach, + HTN- continue Lopressor, Lisinopril 5 mg daily added by Dr. Cornelius Moraswen.... Plavix for ACS, ASA reduced dose 2. Pulm- no acute issues, continue IS 3. Renal- creatinine stable, weight is stable, mild edema on exam-- continue Lasix 4. DM- sugars remain elevated, will resume home diabetic medications 5. Dispo- patient stable, maintaining NSR, Lisinopril was added for HTN, will plan to d/c home today    LOS: 5 days    BARRETT, ERIN 07/31/2016   I have seen and examined the patient and agree with the assessment and plan as outlined.  D/C home today  Purcell Nailslarence H Ashutosh Dieguez, MD 07/31/2016 8:38 AM

## 2016-07-31 NOTE — Progress Notes (Signed)
Ed completed with pt and wife. He is motivated to control DM better. Will refer to [place] [facility]. Set up d/c video.  [id number] Ethelda ChickKristan Millissa Deese CES, ACSM 8:26 AM 07/31/2016

## 2016-07-31 NOTE — Progress Notes (Signed)
Discharged patient per MD order. IV removed, tele d/c, ccmd notified. Order for suture removal but sutures are not quite ready to be removed. [place]aged [place] and spoke with her. She said sutures to stay for one week, may be removed next Tuesday. May 29th. AVS went over with patient and spouse. No questions at this time. Patient to home with wife.  Minerva Endsiffany N Raylin Winer RN

## 2016-08-01 ENCOUNTER — Telehealth (HOSPITAL_COMMUNITY): Payer: Self-pay

## 2016-08-01 NOTE — Telephone Encounter (Signed)
S/w Ronell with Christella Scheuermann ([employer]), verifying insurance benefits He advised that per [employer] they request office notes before scheduling and results from completion of Cardiac Rehab faxed over to [phone]. Gave msg to RN No copay, Coinsurance 25%, Deductible $500.00 nothing met. Out of Pocket $3750.00, pt has met $414.19. Reference # [id number].... KJ

## 2016-08-10 ENCOUNTER — Telehealth (HOSPITAL_COMMUNITY): Payer: Self-pay | Admitting: *Deleted

## 2016-08-10 ENCOUNTER — Telehealth: Payer: Self-pay | Admitting: Physician Assistant

## 2016-08-10 NOTE — Telephone Encounter (Signed)
-----   Message from Carmelina PaddockAshley N Wertz sent at 08/10/2016  3:38 PM EDT ----- Regarding: RE: Cardiac rehab No precert was required but I had to fax his last notes to [facility] at [phone] attn [facility].  They ask for his rehab notes be faxed once completed.  Thank you,  Morrie SheldonAshley  ----- Message ----- From: Chelsea Ausarlton, Carlette B, RN Sent: 08/03/2016   3:09 PM To: [facility] Pre Cert/Auth Subject: Cardiac rehab                                  The above patient is referred to cardiac rehab s/p Cabg x 5 on 5/17 Pt of Dr. Okey DupreEnd.  Pt has [employer].  We called for insurance benefits and was informed pt would need authorization and office notes sent to them prior to scheduling for CR.   I am not accustomed to this type of request.  Do you have any knowledge of this particular type of request and insurance?  Thanks Pharmacist, hospitalCarlette Carlton RN, BSN Cardiac and Emergency planning/management officerulmonary Rehab Nurse Navigator

## 2016-08-10 NOTE — Telephone Encounter (Signed)
Attempted to call patient to follow up on how he is doing since hospital discharge.  There was no answer.

## 2016-08-11 NOTE — Addendum Note (Signed)
Addendum  created 08/11/16 1021 by Jearldine Cassady, MD   Sign clinical note    

## 2016-08-14 ENCOUNTER — Ambulatory Visit (INDEPENDENT_AMBULATORY_CARE_PROVIDER_SITE_OTHER): Payer: 59 | Admitting: Physician Assistant

## 2016-08-14 ENCOUNTER — Encounter (INDEPENDENT_AMBULATORY_CARE_PROVIDER_SITE_OTHER): Payer: Self-pay

## 2016-08-14 ENCOUNTER — Encounter: Payer: Self-pay | Admitting: Physician Assistant

## 2016-08-14 VITALS — BP 130/60 | HR 94 | Ht 69.0 in | Wt 168.0 lb

## 2016-08-14 DIAGNOSIS — I2510 Atherosclerotic heart disease of native coronary artery without angina pectoris: Secondary | ICD-10-CM | POA: Diagnosis not present

## 2016-08-14 DIAGNOSIS — E118 Type 2 diabetes mellitus with unspecified complications: Secondary | ICD-10-CM

## 2016-08-14 DIAGNOSIS — I10 Essential (primary) hypertension: Secondary | ICD-10-CM

## 2016-08-14 DIAGNOSIS — E7800 Pure hypercholesterolemia, unspecified: Secondary | ICD-10-CM | POA: Diagnosis not present

## 2016-08-14 NOTE — Progress Notes (Signed)
Cardiology Office Note:    Date:  08/14/2016   ID:  [name] [name], DOB 04-26-1970, MRN [id number]  PCP:  Venia Carbon, MD  Cardiologist:  Dr. Nelva Bush    Referring MD: Venia Carbon, MD   Chief Complaint  Patient presents with  . Hospitalization Follow-up    s/p CABG    History of Present Illness:    [name] [name] is a 46 y.o. male with a hx of HTN, diabetes, HL, anxiety. He was evaluated by Dr. Saunders Revel on 07/19/16 for an abnormal stress test. The patient had recently been to the emergency room with complaints of back pain with radiation to his anterior chest with exertion. Nuclear stress test was high risk with evidence of anterior ischemia. He was sent to the hospital for Cardiac Catheterization the same day.  This demonstrated 3V CAD and he was referred to [facility] for coronary artery bypass grafting.  He was admitted 5/17-5/22 and underwent CABG 5 (LIMA-LAD RIMA-AM, SVG-PDA, SVG-OM1, SVG-D2).  Postoperative course was fairly unremarkable aside from some orthostasis after his chest tube was removed. His beta blocker was adjusted for sinus tachycardia.  Of note, he was DC on ASA and Plavix, beta-blocker, ACE inhibitor, statin.    [name] [name] returns for post hospitalization follow up.  He is here with his wife.  He has been doing well since DC. He thinks he has developed some URI symptoms.  He denies fever.  He denies significant shortness of breath.  He denies syncope, near syncope, orthopnea, PND, edema. His chest soreness is improved.  He is walking about 10 minutes Twice daily.  He has not heard from cardiac rehabilitation yet.    Prior CV studies:   The following studies were reviewed today:  Intraoperative TEE 07/26/16  Left ventricle: Normal wall thickness. Cavity is mildly dilated. LV systolic function is mildly reduced with an EF of 45-50%. There are no obvious wall motion abnormalities. No thrombus present. No mass present.  Aortic valve: The valve is trileaflet. No  stenosis. Trace regurgitation.  Mitral valve: Trace regurgitation.  Left ventricle: Normal cavity size.  Carotid US 07/20/16 Intimal thickening without significant ICA stenosis  Cardiac Catheterization 07/19/16 LAD prox 90, mid 80, D1 ost 90, D2 ost 90 LCx ost 80, LPDA 100 RCA mid 80 EF 55-65  Nuclear stress test 07/19/16 EF 42, ant-sept, apical ant, apical septal, apical ischemia, down-sloping ST depression inf-lat leads High Risk study  Past Medical History:  Diagnosis Date  . Anxiety   . Coronary artery calcification    a. noted on CT angio 07/16/16  . Coronary artery disease   . High cholesterol   . HTN (hypertension)   . OSA on CPAP    "don't wear it that much" (07/19/2016)  . 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 Type II diabetes mellitus ([place])     Past Surgical History:  Procedure Laterality Date  . CARDIAC CATHETERIZATION  07/19/2016  . CORONARY ARTERY BYPASS GRAFT N/A 07/26/2016   Procedure: CORONARY ARTERY BYPASS GRAFTING (CABG), ON PUMP, TIMES FIVE, USING BILATERAL INTERNAL MAMMARY ARTERIES AND ENDOSCOPICALLY HARVESTED RIGHT GREATER SAPHENOUS VEIN;  Surgeon: Rexene Alberts, MD;  Location: [facility];  Service: Open Heart Surgery;  Laterality: N/A;  LIMA to LAD RIMA to Acute Marginal SVG to PDA SVG to Diag 2 SVG to OM1  . LEFT HEART CATH  AND CORONARY ANGIOGRAPHY N/A 07/19/2016   Procedure: Left Heart Cath and Coronary Angiography;  Surgeon: Jettie Booze, MD;  Location: [facility];  Service: Cardiovascular;  Laterality: N/A;  . ORCHIOPEXY Right ~ 1980   "undescending testicle"  . TEE WITHOUT CARDIOVERSION N/A 07/26/2016   Procedure: TRANSESOPHAGEAL ECHOCARDIOGRAM (TEE);  Surgeon: Rexene Alberts, MD;  Location: [facility];  Service: Open Heart Surgery;  Laterality: N/A;    Current Medications: Current Meds  Medication Sig  . acetaminophen (TYLENOL) 500 MG tablet Take 2 tablets  (1,000 mg total) by mouth every 6 (six) hours as needed.  Marland Kitchen aspirin 81 MG chewable tablet Chew 1 tablet (81 mg total) by mouth daily.  Marland Kitchen atorvastatin (LIPITOR) 40 MG tablet Take 1 tablet (40 mg total) by mouth daily.  . clopidogrel (PLAVIX) 75 MG tablet Take 1 tablet (75 mg total) by mouth daily.  Marland Kitchen glimepiride (AMARYL) 1 MG tablet Take 1 mg by mouth daily with breakfast.  . insulin glargine (LANTUS) 100 UNIT/ML injection Inject 50 Units into the skin at bedtime.  Marland Kitchen lisinopril (PRINIVIL,ZESTRIL) 5 MG tablet Take 5 mg by mouth daily.  . metFORMIN (GLUCOPHAGE) 1000 MG tablet Take 1,000 mg by mouth 2 (two) times daily with a meal.  . metoprolol tartrate (LOPRESSOR) 25 MG tablet Take 1 tablet (25 mg total) by mouth 2 (two) times daily.  Marland Kitchen rOPINIRole (REQUIP) 0.5 MG tablet Take 0.25 mg by mouth at bedtime.  . sertraline (ZOLOFT) 100 MG tablet Take 100 mg by mouth daily.     Allergies:   Patient has no known allergies.   Social History   Social History  . Marital status: Married    Spouse name: N/A  . Number of children: N/A  . Years of education: N/A   Social History Main Topics  . Smoking status: Never Smoker  . Smokeless tobacco: Never Used  . Alcohol use No  . Drug use: No  . Sexual activity: Yes   Other Topics Concern  . None   Social History Narrative  . None     Family Hx: The patient's family history includes CAD in his paternal grandfather; Heart attack in his paternal grandfather; Heart failure in his mother.  ROS:   Please see the history of present illness.    Review of Systems  Respiratory: Positive for cough.   Musculoskeletal: Positive for joint pain.   All other systems reviewed and are negative.   EKGs/Labs/Other Test Reviewed:    EKG:  EKG is   ordered today.  The ekg ordered today demonstrates NSR, HR 94, normal axis, diff TW changes (?post op changes), QTc 417 ms  Recent Labs: 07/26/2016: ALT 45 07/27/2016: Magnesium 2.0 07/30/2016: BUN 16;  Creatinine, Ser 0.72; Hemoglobin 8.3; Platelets 240; Potassium 4.2; Sodium 138   Recent Lipid Panel Lab Results  Component Value Date/Time   CHOL 158 07/20/2016 12:25 PM   TRIG 68 07/20/2016 12:25 PM   HDL 34 (L) 07/20/2016 12:25 PM   CHOLHDL 4.6 07/20/2016 12:25 PM   LDLCALC 110 (H) 07/20/2016 12:25 PM    Physical Exam:    VS:  BP 130/60   Pulse 94   Ht _0  (1.753 m)   Wt 168 lb (76.2 kg)   BMI 24.81 kg/m     Wt Readings from Last 3 Encounters:  08/14/16 168 lb (76.2 kg)  07/31/16 176 lb 14.4 oz (80.2 kg)  07/19/16 175 lb (79.4 kg)     Physical Exam  Constitutional: He is oriented to person, place, and time.  Cardiovascular: Normal rate, regular rhythm and normal heart sounds.   No murmur heard. Pulmonary/Chest: Effort normal. He has no wheezes. He has no rales.  Median sternotomy well healed without erythema or d/c  Abdominal: There is no tenderness.  Musculoskeletal: He exhibits no edema (R leg vein harvest site without erythema or d/c).  Neurological: He is alert and oriented to person, place, and time.  Skin: Skin is warm and dry.  Psychiatric: He has a normal mood and affect.    ASSESSMENT:    1. Coronary artery disease involving native coronary artery of native heart without angina pectoris   2. Essential hypertension   3. Hypercholesteremia   4. Type 2 diabetes mellitus with complication, without long-term current use of insulin (HCC)    PLAN:    In order of problems listed above:  1. Coronary artery disease involving native coronary artery of native heart without angina pectoris -  S/p CABG.  He is progressing well. I have encouraged him to pursue cardiac rehabilitation.  He will call us if he does not hear from them. Continue ASA, Clopidogrel, statin, beta-blocker, ACE inhibitor.    2. Essential hypertension -  The patient's blood pressure is controlled on his current regimen.  Continue current therapy.    3. Hypercholesteremia -  Continue mod  intensity statin.  -  Plan: Comp Met (CMET), Lipid Profile  4. Type 2 diabetes mellitus with complication, without long-term current use of insulin (HCC) - FU with PCP for management.    Dispo:  Return in about 3 months (around 11/14/2016) for Routine Follow Up, w/ Dr. Saunders Revel.   Medication Adjustments/Labs and Tests Ordered: Current medicines are reviewed at length with the patient today.  Concerns regarding medicines are outlined above.  Orders/Tests:  Orders Placed This Encounter  Procedures  . Comp Met (CMET)  . Lipid Profile  . EKG 12-Lead   Medication changes: No orders of the defined types were placed in this encounter.  Signed, Richardson Dopp, PA-C  08/14/2016 10:27 AM    [facility] [place], [place], [place]  [address] Phone: [phone]; Fax: [phone]

## 2016-08-14 NOTE — Patient Instructions (Addendum)
Medication Instructions:  No changes.   Labwork: In 3 mos - CMET, Lipids 11/22/16 8 AM FOR FASTING LAB WORK  Testing/Procedures: None   Follow-Up: Dr. Cristal Deerhristopher End in 3 months. 11/26/16@ 9:20   Any Other Special Instructions Will Be Listed Below (If Applicable). Call us if you do not hear from cardiac rehabilitation before you see Dr. Cornelius Moraswen.  If you need a refill on your cardiac medications before your next appointment, please call your pharmacy.   Please call to see if you can schedule an earlier appointment with your primary care provider Alphonsus Sias([place], Berneda Roseichard I, MD).

## 2016-08-24 ENCOUNTER — Telehealth (HOSPITAL_COMMUNITY): Payer: Self-pay | Admitting: Internal Medicine

## 2016-08-24 ENCOUNTER — Other Ambulatory Visit: Payer: Self-pay | Admitting: Thoracic Surgery (Cardiothoracic Vascular Surgery)

## 2016-08-24 DIAGNOSIS — Z951 Presence of aortocoronary bypass graft: Secondary | ICD-10-CM

## 2016-08-27 ENCOUNTER — Ambulatory Visit (INDEPENDENT_AMBULATORY_CARE_PROVIDER_SITE_OTHER): Payer: Self-pay | Admitting: Physician Assistant

## 2016-08-27 ENCOUNTER — Ambulatory Visit
Admission: RE | Admit: 2016-08-27 | Discharge: 2016-08-27 | Disposition: A | Discharge status: Home / Self Care | Payer: 59 | Source: Ambulatory Visit | Attending: Thoracic Surgery (Cardiothoracic Vascular Surgery) | Admitting: Thoracic Surgery (Cardiothoracic Vascular Surgery)

## 2016-08-27 VITALS — BP 127/82 | HR 76 | Resp 20 | Ht 69.0 in | Wt 171.0 lb

## 2016-08-27 DIAGNOSIS — Z951 Presence of aortocoronary bypass graft: Secondary | ICD-10-CM

## 2016-08-27 NOTE — Patient Instructions (Signed)
You may return to driving an automobile as long as you are no longer requiring oral narcotic pain relievers during the daytime.  It would be wise to start driving only short distances during the daylight and gradually increase from there as you feel comfortable.  Make every effort to keep your diabetes under very tight control.  Follow up closely with your primary care physician or endocrinologist and strive to keep their hemoglobin A1c levels as low as possible, preferably near or below 6.0.  The long term benefits of strict control of diabetes are far reaching and critically important for your overall health and survival.  Make every effort to stay physically active, get some type of exercise on a regular basis, and stick to a "heart healthy diet".  The long term benefits for regular exercise and a healthy diet are critically important to your overall health and wellbeing.  Follow-up as needed.

## 2016-08-27 NOTE — Progress Notes (Signed)
[address]       [place] [address]             [phone]      [name] [name] is a 46 y.o. male patient who underwent a CABG x 5 on 07/26/2016 with Dr. Cornelius Moras here for a routine post-op visit.  1. S/P CABG x 5    Past Medical History:  Diagnosis Date  . Anxiety   . Coronary artery calcification    a. noted on CT angio 07/16/16  . Coronary artery disease   . High cholesterol   . HTN (hypertension)   . OSA on CPAP    "don't wear it that much" (07/19/2016)  . 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 Type II diabetes mellitus (HCC)    No past surgical history pertinent negatives on file. Scheduled Meds: Current Outpatient Prescriptions on File Prior to Visit  Medication Sig Dispense Refill  . acetaminophen (TYLENOL) 500 MG tablet Take 2 tablets (1,000 mg total) by mouth every 6 (six) hours as needed. 30 tablet 0  . aspirin 81 MG chewable tablet Chew 1 tablet (81 mg total) by mouth daily. 30 tablet 0  . atorvastatin (LIPITOR) 40 MG tablet Take 1 tablet (40 mg total) by mouth daily. 30 tablet 11  . clopidogrel (PLAVIX) 75 MG tablet Take 1 tablet (75 mg total) by mouth daily. 30 tablet 3  . glimepiride (AMARYL) 1 MG tablet Take 1 mg by mouth daily with breakfast.    . insulin glargine (LANTUS) 100 UNIT/ML injection Inject 50 Units into the skin at bedtime.    Marland Kitchen lisinopril (PRINIVIL,ZESTRIL) 5 MG tablet Take 5 mg by mouth daily.    . metFORMIN (GLUCOPHAGE) 1000 MG tablet Take 1,000 mg by mouth 2 (two) times daily with a meal.    . metoprolol tartrate (LOPRESSOR) 25 MG tablet Take 1 tablet (25 mg total) by mouth 2 (two) times daily. 60 tablet 2  . rOPINIRole (REQUIP) 0.5 MG tablet Take 0.25 mg by mouth at bedtime.    . sertraline (ZOLOFT) 100 MG tablet Take 100 mg by mouth daily.     No current facility-administered medications on file prior to visit.      Blood pressure 127/82, pulse  76, resp. rate 20, height 5\' 9"  (1.753 m), weight 77.6 kg (171 lb), SpO2 99 %.  Subjective: The patient presents s/p CABG x 5 on 5/17 for a post-op visit.   Objective: Cor: RRR, no murmur Pulm: CTA bilaterally Abd: non-tender Ext: no edema Wound: one area on sternal incision with eschar which I peeled away and there was a small amount of purulent drainage. EVH site was c/d/i  CXR without pleural effusions, sternal wires aligned, await official read.   Assessment & Plan : [name] [name] is a 46 year old male who underwent a coronary bypass grafting 5 with Dr. Cornelius Moras. Overall he has been progressing well. He is walking one to 2 miles a day without shortness of breath. He has not had much sternal soreness and stopped his pain medication several weeks ago. He has been controlling his blood glucose level and generally runs between 100-130. He asked me about resuming his sildenafil which I said would be fine in the next week or so. I cautioned him to check his blood pressure to make sure that he would be able  to tolerate the sildenafil since it does decrease in blood pressure. He is aware that his sternum is still unstable and will not be fully healed for another 2 weeks. He asked about resuming his activity on his 0 turn riding mower which I said is fine. I did share that the vibration of the mower may make his sternum sore the next day. I did tell him to refrain from any other yard work using the upper body including but not limited to weed walking, leaf blowing, and other upper body movements. He did not think he needed cardiac rehabilitation. He has a treadmill at home which he can walk on and feels that he is able to rehabilitation on his own. He shares that he has been having trouble sleeping. I offered some over-the-counter sleep aids including a melatonin and Tylenol PM. He shares that Benadryl actually makes him more hyper. I did not suggest to use this. He had no further questions at this time. He is  told to follow up in the office as needed. I recommended that he keep an eye on the area on his sternum which we cleaned today. If it becomes more red or the drainage increases and he is to call the office for a quick sternal incision check. He is to continue washing his incisions with soap and water daily. He may return to driving since he is no longer requiring pain medication. He is encouraged to call our office if he has any additional questions or concerns. He is to follow-up as needed. He already had a cardiology appointment and had no issues at that time. I explained that either our service for cardiology may give a okay for a work note. 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  Sharlene Doryessa N Metzli Pollick 08/27/2016

## 2016-08-28 ENCOUNTER — Inpatient Hospital Stay (HOSPITAL_COMMUNITY): Admission: RE | Admit: 2016-08-28 | Payer: PRIVATE HEALTH INSURANCE | Source: Ambulatory Visit

## 2016-09-03 ENCOUNTER — Ambulatory Visit (HOSPITAL_COMMUNITY): Payer: PRIVATE HEALTH INSURANCE

## 2016-09-05 ENCOUNTER — Ambulatory Visit (HOSPITAL_COMMUNITY): Payer: PRIVATE HEALTH INSURANCE

## 2016-09-07 ENCOUNTER — Ambulatory Visit (HOSPITAL_COMMUNITY): Payer: PRIVATE HEALTH INSURANCE

## 2016-09-10 ENCOUNTER — Ambulatory Visit (HOSPITAL_COMMUNITY): Payer: PRIVATE HEALTH INSURANCE

## 2016-09-14 ENCOUNTER — Ambulatory Visit (HOSPITAL_COMMUNITY): Payer: PRIVATE HEALTH INSURANCE

## 2016-09-17 ENCOUNTER — Ambulatory Visit (HOSPITAL_COMMUNITY): Payer: PRIVATE HEALTH INSURANCE

## 2016-09-19 ENCOUNTER — Ambulatory Visit (HOSPITAL_COMMUNITY): Payer: PRIVATE HEALTH INSURANCE

## 2016-09-21 ENCOUNTER — Ambulatory Visit (HOSPITAL_COMMUNITY): Payer: PRIVATE HEALTH INSURANCE

## 2016-09-24 ENCOUNTER — Ambulatory Visit (HOSPITAL_COMMUNITY): Payer: PRIVATE HEALTH INSURANCE

## 2016-09-26 ENCOUNTER — Ambulatory Visit (HOSPITAL_COMMUNITY): Payer: PRIVATE HEALTH INSURANCE

## 2016-09-28 ENCOUNTER — Ambulatory Visit (HOSPITAL_COMMUNITY): Payer: PRIVATE HEALTH INSURANCE

## 2016-10-01 ENCOUNTER — Ambulatory Visit (HOSPITAL_COMMUNITY): Payer: PRIVATE HEALTH INSURANCE

## 2016-10-03 ENCOUNTER — Ambulatory Visit (HOSPITAL_COMMUNITY): Payer: PRIVATE HEALTH INSURANCE

## 2016-10-05 ENCOUNTER — Ambulatory Visit (HOSPITAL_COMMUNITY): Payer: PRIVATE HEALTH INSURANCE

## 2016-10-08 ENCOUNTER — Ambulatory Visit (HOSPITAL_COMMUNITY): Payer: PRIVATE HEALTH INSURANCE

## 2016-10-10 ENCOUNTER — Ambulatory Visit (HOSPITAL_COMMUNITY): Payer: PRIVATE HEALTH INSURANCE

## 2016-10-12 ENCOUNTER — Ambulatory Visit (HOSPITAL_COMMUNITY): Payer: PRIVATE HEALTH INSURANCE

## 2016-10-15 ENCOUNTER — Ambulatory Visit (HOSPITAL_COMMUNITY): Payer: PRIVATE HEALTH INSURANCE

## 2016-10-17 ENCOUNTER — Ambulatory Visit (HOSPITAL_COMMUNITY): Payer: PRIVATE HEALTH INSURANCE

## 2016-10-19 ENCOUNTER — Ambulatory Visit (HOSPITAL_COMMUNITY): Payer: PRIVATE HEALTH INSURANCE

## 2016-10-22 ENCOUNTER — Other Ambulatory Visit: Payer: Self-pay | Admitting: *Deleted

## 2016-10-22 ENCOUNTER — Ambulatory Visit (HOSPITAL_COMMUNITY): Payer: PRIVATE HEALTH INSURANCE

## 2016-10-22 MED ORDER — METOPROLOL TARTRATE 25 MG PO TABS: 25.0000 mg | ORAL_TABLET | Freq: Two times a day (BID) | ORAL | 5 refills | Status: DC

## 2016-10-24 ENCOUNTER — Ambulatory Visit (HOSPITAL_COMMUNITY): Payer: PRIVATE HEALTH INSURANCE

## 2016-10-26 ENCOUNTER — Ambulatory Visit (HOSPITAL_COMMUNITY): Payer: PRIVATE HEALTH INSURANCE

## 2016-10-29 ENCOUNTER — Ambulatory Visit (HOSPITAL_COMMUNITY): Payer: PRIVATE HEALTH INSURANCE

## 2016-10-31 ENCOUNTER — Ambulatory Visit (HOSPITAL_COMMUNITY): Payer: PRIVATE HEALTH INSURANCE

## 2016-11-02 ENCOUNTER — Ambulatory Visit (HOSPITAL_COMMUNITY): Payer: PRIVATE HEALTH INSURANCE

## 2016-11-05 ENCOUNTER — Ambulatory Visit (HOSPITAL_COMMUNITY): Payer: PRIVATE HEALTH INSURANCE

## 2016-11-07 ENCOUNTER — Ambulatory Visit (HOSPITAL_COMMUNITY): Payer: PRIVATE HEALTH INSURANCE

## 2016-11-08 ENCOUNTER — Ambulatory Visit (INDEPENDENT_AMBULATORY_CARE_PROVIDER_SITE_OTHER): Payer: 59 | Admitting: Internal Medicine

## 2016-11-08 ENCOUNTER — Encounter: Payer: Self-pay | Admitting: Internal Medicine

## 2016-11-08 VITALS — BP 110/70 | HR 77 | Temp 98.1°F | Ht 68.5 in | Wt 179.0 lb

## 2016-11-08 DIAGNOSIS — I2510 Atherosclerotic heart disease of native coronary artery without angina pectoris: Secondary | ICD-10-CM | POA: Diagnosis not present

## 2016-11-08 DIAGNOSIS — G2581 Restless legs syndrome: Secondary | ICD-10-CM | POA: Insufficient documentation

## 2016-11-08 DIAGNOSIS — Z794 Long term (current) use of insulin: Secondary | ICD-10-CM

## 2016-11-08 DIAGNOSIS — Z23 Encounter for immunization: Secondary | ICD-10-CM

## 2016-11-08 DIAGNOSIS — F419 Anxiety disorder, unspecified: Secondary | ICD-10-CM | POA: Diagnosis not present

## 2016-11-08 DIAGNOSIS — E1159 Type 2 diabetes mellitus with other circulatory complications: Secondary | ICD-10-CM

## 2016-11-08 DIAGNOSIS — I10 Essential (primary) hypertension: Secondary | ICD-10-CM

## 2016-11-08 LAB — HM DIABETES FOOT EXAM

## 2016-11-08 MED ORDER — ROPINIROLE HCL 1 MG PO TABS: 1.0000 mg | ORAL_TABLET | Freq: Every day | ORAL | 11 refills | Status: DC

## 2016-11-08 NOTE — Assessment & Plan Note (Signed)
Early disease likely related to DM Doing well since CABG On beta blocker and ACEI

## 2016-11-08 NOTE — Progress Notes (Signed)
Subjective:    Patient ID: [name] [name], male    DOB: 1971/01/29, 46 y.o.   MRN: [id number]  HPI Here to establish care I have known his wife for many years  Has diabetes--- from his late 20's Not juvenile diabetes though--didn't need insulin for quite some time Didn't take care of himself for some time--now better On insulin for 4 years or so Checks sugars regularly bid--has had some hypoglycemic reactions at night--woke him up (in 50's) and often low in AM Usually under 140 in evening Exercises fairly vigorously in gym or at home in evening  Also with HTN Well controlled of late  Now with CAD---had acute back pain with exercise Soon after, at work and pain/SOB To ER---wound up having CABG after stress test/cath  Ongoing RLS Wonders if he needs higher ropinrole dose Ran out yesterday and really bad night  Chronic anxiety problems Mild lifelong symptoms Medication started in the past year--has helped a lot  Current Outpatient Prescriptions on File Prior to Visit  Medication Sig Dispense Refill  . acetaminophen (TYLENOL) 500 MG tablet Take 2 tablets (1,000 mg total) by mouth every 6 (six) hours as needed. 30 tablet 0  . aspirin 81 MG chewable tablet Chew 1 tablet (81 mg total) by mouth daily. 30 tablet 0  . atorvastatin (LIPITOR) 40 MG tablet Take 1 tablet (40 mg total) by mouth daily. 30 tablet 11  . clopidogrel (PLAVIX) 75 MG tablet Take 1 tablet (75 mg total) by mouth daily. 30 tablet 3  . glimepiride (AMARYL) 1 MG tablet Take 1 mg by mouth daily with breakfast.    . insulin glargine (LANTUS) 100 UNIT/ML injection Inject 50 Units into the skin at bedtime.    Marland Kitchen lisinopril (PRINIVIL,ZESTRIL) 5 MG tablet Take 5 mg by mouth daily.    . metFORMIN (GLUCOPHAGE) 1000 MG tablet Take 1,000 mg by mouth 2 (two) times daily with a meal.    . metoprolol tartrate (LOPRESSOR) 25 MG tablet Take 1 tablet (25 mg total) by mouth 2 (two) times daily. 60 tablet 5  . sertraline (ZOLOFT) 100 MG  tablet Take 100 mg by mouth daily.     No current facility-administered medications on file prior to visit.     Allergies  Allergen Reactions  . Simvastatin Other (See Comments)    Elevated LFTs?    Past Medical History:  Diagnosis Date  . Anxiety   . Coronary artery calcification    a. noted on CT angio 07/16/16  . Coronary artery disease   . High cholesterol   . HTN (hypertension)   . OSA on CPAP    "don't wear it that much" (07/19/2016)  . 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 Type II diabetes mellitus (HCC)     Past Surgical History:  Procedure Laterality Date  . CARDIAC CATHETERIZATION  07/19/2016  . CORONARY ARTERY BYPASS GRAFT N/A 07/26/2016   Procedure: CORONARY ARTERY BYPASS GRAFTING (CABG), ON PUMP, TIMES FIVE, USING BILATERAL INTERNAL MAMMARY ARTERIES AND ENDOSCOPICALLY HARVESTED RIGHT GREATER SAPHENOUS VEIN;  Surgeon: Purcell Nails, MD;  Location: [facility] OR;  Service: Open Heart Surgery;  Laterality: N/A;  LIMA to LAD RIMA to Acute Marginal SVG to PDA SVG to Diag 2 SVG to OM1  . LEFT HEART CATH AND CORONARY ANGIOGRAPHY N/A 07/19/2016   Procedure: Left Heart Cath and Coronary Angiography;  Surgeon: Corky CraftsVaranasi, Jayadeep S, MD;  [facility];  Service: Cardiovascular;  Laterality: N/A;  . ORCHIOPEXY Right ~ 1980   "undescending testicle"  . TEE WITHOUT CARDIOVERSION N/A 07/26/2016   Procedure: TRANSESOPHAGEAL ECHOCARDIOGRAM (TEE);  Surgeon: Purcell Nailswen, Clarence H, MD;  [facility];  Service: Open Heart Surgery;  Laterality: N/A;    Family History  Problem Relation Age of Onset  . Heart failure Mother   . CAD Paternal Grandfather   . Heart attack Paternal Grandfather     Social History   Social History  . Marital status: Married    Spouse name: N/A  . Number of children: 0  . Years of education: N/A   Occupational History  . [employer]   Social History  Main Topics  . Smoking status: Never Smoker  . Smokeless tobacco: Never Used  . Alcohol use No  . Drug use: No  . Sexual activity: Yes   Other Topics Concern  . Not on file   Social History Narrative   Divorced then remarried 2015   2 stepchildren from prior marriage   2 stepsons from this marriage   Review of Systems  Constitutional: Negative for fatigue and unexpected weight change.  HENT: Negative for dental problem and hearing loss.   Eyes: Negative for visual disturbance.       Needs readers now  Respiratory: Negative for cough, chest tightness and shortness of breath.   Cardiovascular: Negative for chest pain, palpitations and leg swelling.  Gastrointestinal: Negative for abdominal pain, blood in stool and constipation.  Endocrine: Negative for polydipsia and polyuria.  Genitourinary: Negative for difficulty urinating and urgency.       Sildenafil for ED--this helps  Musculoskeletal: Positive for back pain. Negative for arthralgias and joint swelling.  Skin: Negative for rash.  Allergic/Immunologic: Negative for environmental allergies and immunocompromised state.  Neurological: Negative for dizziness, syncope and light-headedness.  Hematological: Negative for adenopathy. Bruises/bleeds easily.  Psychiatric/Behavioral: Negative for dysphoric mood and sleep disturbance. The patient is nervous/anxious.        Objective:   Physical Exam  Constitutional: He appears well-nourished. No distress.  Neck: No thyromegaly present.  Cardiovascular: Normal rate, regular rhythm, normal heart sounds and intact distal pulses.  Exam reveals no gallop.   No murmur heard. Pulmonary/Chest: Effort normal and breath sounds normal. No respiratory distress. He has no wheezes. He has no rales.  Abdominal: Soft. There is no tenderness.  Musculoskeletal: He exhibits no edema or tenderness.  Lymphadenopathy:    He has no cervical adenopathy.  Neurological:  Normal sensation in feet  Skin:  No rash noted. No erythema.  No foot lesions  Psychiatric: He has a normal mood and affect. His behavior is normal.          Assessment & Plan:

## 2016-11-08 NOTE — Assessment & Plan Note (Signed)
Does okay with sertraline

## 2016-11-08 NOTE — Assessment & Plan Note (Signed)
Increased symptoms Will allow higher ropinirole but may need iron

## 2016-11-08 NOTE — Assessment & Plan Note (Signed)
BP Readings from Last 3 Encounters:  11/08/16 110/70  08/27/16 127/82  08/14/16 130/60   Good control

## 2016-11-08 NOTE — Patient Instructions (Signed)
Try taking the lantus in the morning, if you still get low sugar reactions, try splitting the dose and take it 25 units twice a day. Let me know than if you are still having the low sugar reactions.

## 2016-11-08 NOTE — Assessment & Plan Note (Signed)
Reasonable control but would prefer closer to 7% I am concerned about the nighttime hypoglycemia and may be related to lantus (though isn't supposed to peak) and night exercise Try lantus in AM, or split. If still lows, will decrease lantus and increase amaryl

## 2016-11-08 NOTE — Addendum Note (Signed)
Addended by: Eual FinesBRIDGES, SHANNON P on: 11/08/2016 04:42 PM   Modules accepted: Orders

## 2016-11-09 ENCOUNTER — Ambulatory Visit (HOSPITAL_COMMUNITY): Payer: PRIVATE HEALTH INSURANCE

## 2016-11-09 ENCOUNTER — Other Ambulatory Visit: Payer: Self-pay

## 2016-11-09 LAB — CBC WITH DIFFERENTIAL/PLATELET
BASOS ABS: 0.1 10*3/uL (ref 0.0–0.1)
Basophils Relative: 0.9 % (ref 0.0–3.0)
EOS PCT: 1.2 % (ref 0.0–5.0)
Eosinophils Absolute: 0.1 10*3/uL (ref 0.0–0.7)
HCT: 42.5 % (ref 39.0–52.0)
HEMOGLOBIN: 14 g/dL (ref 13.0–17.0)
Lymphocytes Relative: 34.9 % (ref 12.0–46.0)
Lymphs Abs: 2.5 10*3/uL (ref 0.7–4.0)
MCHC: 32.9 g/dL (ref 30.0–36.0)
MCV: 93.7 fl (ref 78.0–100.0)
MONO ABS: 0.6 10*3/uL (ref 0.1–1.0)
MONOS PCT: 7.8 % (ref 3.0–12.0)
Neutro Abs: 3.9 10*3/uL (ref 1.4–7.7)
Neutrophils Relative %: 55.2 % (ref 43.0–77.0)
Platelets: 268 10*3/uL (ref 150.0–400.0)
RBC: 4.54 Mil/uL (ref 4.22–5.81)
RDW: 13.6 % (ref 11.5–15.5)
WBC: 7.1 10*3/uL (ref 4.0–10.5)

## 2016-11-09 LAB — HEMOGLOBIN A1C: HEMOGLOBIN A1C: 9.3 % — AB (ref 4.6–6.5)

## 2016-11-09 MED ORDER — GLIMEPIRIDE 2 MG PO TABS: 2.0000 mg | ORAL_TABLET | Freq: Every day | ORAL | 3 refills | Status: DC

## 2016-11-09 NOTE — Telephone Encounter (Signed)
Per lab results, glimpiride change to 2mg  from 1 mg. Pt is aware.

## 2016-11-14 ENCOUNTER — Ambulatory Visit (HOSPITAL_COMMUNITY): Payer: PRIVATE HEALTH INSURANCE

## 2016-11-16 ENCOUNTER — Ambulatory Visit (HOSPITAL_COMMUNITY): Payer: PRIVATE HEALTH INSURANCE

## 2016-11-19 ENCOUNTER — Other Ambulatory Visit: Payer: Self-pay

## 2016-11-19 ENCOUNTER — Ambulatory Visit (HOSPITAL_COMMUNITY): Payer: PRIVATE HEALTH INSURANCE

## 2016-11-19 NOTE — Telephone Encounter (Signed)
Pt left /vm requesting refill metformin and sertraline;Dr Alphonsus SiasLetvak has not filled meds before; pt established on 11/08/16 and has appt scheduled 04/2017.Please advise. [facility].

## 2016-11-19 NOTE — Telephone Encounter (Signed)
Approved: okay x 1 year for both

## 2016-11-20 MED ORDER — METFORMIN HCL 1000 MG PO TABS: 1000.0000 mg | ORAL_TABLET | Freq: Two times a day (BID) | ORAL | 3 refills | Status: DC

## 2016-11-20 MED ORDER — SERTRALINE HCL 100 MG PO TABS: 100.0000 mg | ORAL_TABLET | Freq: Every day | ORAL | 3 refills | Status: DC

## 2016-11-21 ENCOUNTER — Ambulatory Visit (HOSPITAL_COMMUNITY): Payer: PRIVATE HEALTH INSURANCE

## 2016-11-22 ENCOUNTER — Other Ambulatory Visit: Payer: 59 | Admitting: *Deleted

## 2016-11-22 DIAGNOSIS — I2510 Atherosclerotic heart disease of native coronary artery without angina pectoris: Secondary | ICD-10-CM

## 2016-11-22 DIAGNOSIS — I10 Essential (primary) hypertension: Secondary | ICD-10-CM

## 2016-11-22 DIAGNOSIS — E7800 Pure hypercholesterolemia, unspecified: Secondary | ICD-10-CM

## 2016-11-22 LAB — COMPREHENSIVE METABOLIC PANEL
A/G RATIO: 1.6 (ref 1.2–2.2)
ALK PHOS: 105 IU/L (ref 39–117)
ALT: 78 IU/L — AB (ref 0–44)
AST: 34 IU/L (ref 0–40)
Albumin: 4.4 g/dL (ref 3.5–5.5)
BILIRUBIN TOTAL: 0.4 mg/dL (ref 0.0–1.2)
BUN / CREAT RATIO: 20 (ref 9–20)
BUN: 16 mg/dL (ref 6–24)
CHLORIDE: 99 mmol/L (ref 96–106)
CO2: 24 mmol/L (ref 20–29)
Calcium: 9.5 mg/dL (ref 8.7–10.2)
Creatinine, Ser: 0.82 mg/dL (ref 0.76–1.27)
GFR calc non Af Amer: 107 mL/min/{1.73_m2} (ref >59)
GFR, EST AFRICAN AMERICAN: 123 mL/min/{1.73_m2} (ref >59)
Globulin, Total: 2.8 g/dL (ref 1.5–4.5)
Glucose: 239 mg/dL — ABNORMAL HIGH (ref 65–99)
POTASSIUM: 4.7 mmol/L (ref 3.5–5.2)
Sodium: 137 mmol/L (ref 134–144)
TOTAL PROTEIN: 7.2 g/dL (ref 6.0–8.5)

## 2016-11-22 LAB — LIPID PANEL
Chol/HDL Ratio: 3.6 ratio (ref 0.0–5.0)
Cholesterol, Total: 122 mg/dL (ref 100–199)
HDL: 34 mg/dL — AB (ref >39)
LDL Calculated: 64 mg/dL (ref 0–99)
Triglycerides: 119 mg/dL (ref 0–149)
VLDL Cholesterol Cal: 24 mg/dL (ref 5–40)

## 2016-11-23 ENCOUNTER — Telehealth: Payer: Self-pay | Admitting: *Deleted

## 2016-11-23 ENCOUNTER — Ambulatory Visit (HOSPITAL_COMMUNITY): Payer: PRIVATE HEALTH INSURANCE

## 2016-11-23 DIAGNOSIS — I10 Essential (primary) hypertension: Secondary | ICD-10-CM

## 2016-11-23 DIAGNOSIS — E7800 Pure hypercholesterolemia, unspecified: Secondary | ICD-10-CM

## 2016-11-23 NOTE — Telephone Encounter (Signed)
-----   Message from Beatrice Lecher, [place] sent at 11/23/2016  9:16 AM EDT ----- Please call the patient The Lipids are at goal.  The Kidney function is normal.  The sugar (glucose) is high.  The liver enzymes are somewhat elevated (ALT is high).   [name] [name] should follow up with his PCP as planned for diabetes.  He should also see his PCP to evaluate elevated LFTs. Continue current medications.   Repeat LFTs in 3 weeks.  Tereso Newcomer, PA-C    11/23/2016 9:13 AM

## 2016-11-23 NOTE — Telephone Encounter (Signed)
Ptcb and has been notified of lab results. He states he and Dr. Alphonsus Sias are are working on DM trying different options to control glucose levels. Pt agreeable to come in 10/5 for repeat LFT. Pt thanked me for my call today.

## 2016-11-23 NOTE — Telephone Encounter (Signed)
-----   Message from Scott T Weaver, PA-C sent at 11/23/2016  9:16 AM EDT ----- Please call the patient The Lipids are at goal.  The Kidney function is normal.  The sugar (glucose) is high.  The liver enzymes are somewhat elevated (ALT is high).   Mr. Haack should follow up with his PCP as planned for diabetes.  He should also see his PCP to evaluate elevated LFTs. Continue current medications.   Repeat LFTs in 3 weeks.  Scott Weaver, PA-C    11/23/2016 9:13 AM    

## 2016-11-23 NOTE — Addendum Note (Signed)
Addended by: Tarri Fuller on: 11/23/2016 04:27 PM   Modules accepted: Orders

## 2016-11-23 NOTE — Telephone Encounter (Signed)
Left message to go over lab results and recommendations.  

## 2016-11-26 ENCOUNTER — Ambulatory Visit (INDEPENDENT_AMBULATORY_CARE_PROVIDER_SITE_OTHER): Payer: 59 | Admitting: Internal Medicine

## 2016-11-26 ENCOUNTER — Ambulatory Visit (HOSPITAL_COMMUNITY): Payer: PRIVATE HEALTH INSURANCE

## 2016-11-26 ENCOUNTER — Encounter: Payer: Self-pay | Admitting: Internal Medicine

## 2016-11-26 VITALS — BP 110/82 | HR 76 | Ht 68.5 in | Wt 183.6 lb

## 2016-11-26 DIAGNOSIS — E1159 Type 2 diabetes mellitus with other circulatory complications: Secondary | ICD-10-CM

## 2016-11-26 DIAGNOSIS — R740 Nonspecific elevation of levels of transaminase and lactic acid dehydrogenase [LDH]: Secondary | ICD-10-CM | POA: Diagnosis not present

## 2016-11-26 DIAGNOSIS — R7401 Elevation of levels of liver transaminase levels: Secondary | ICD-10-CM | POA: Insufficient documentation

## 2016-11-26 DIAGNOSIS — I2510 Atherosclerotic heart disease of native coronary artery without angina pectoris: Secondary | ICD-10-CM

## 2016-11-26 DIAGNOSIS — Z794 Long term (current) use of insulin: Secondary | ICD-10-CM | POA: Diagnosis not present

## 2016-11-26 DIAGNOSIS — E785 Hyperlipidemia, unspecified: Secondary | ICD-10-CM | POA: Diagnosis not present

## 2016-11-26 MED ORDER — CLOPIDOGREL BISULFATE 75 MG PO TABS: 75.0000 mg | ORAL_TABLET | Freq: Every day | ORAL | 1 refills | Status: DC

## 2016-11-26 NOTE — Progress Notes (Signed)
Follow-up Outpatient Visit Date: 11/26/2016  Primary Care Provider: Karie Schwalbe, MD [address] [place] [address]  Chief Complaint: Follow-up coronary artery disease  HPI:  [name] [name] is a 47 y.o. year-old male with history of coronary artery disease status post CABG (07/2016), hypertension, type 2 diabetes mellitus, and anxiety, who presents for follow-up of coronary artery disease. I last saw [name] [name] in May following abnormal stress test. He was directly admitted to [facility] and underwent urgent left heart catheterization that showed severe 3-vessel coronary artery disease. He underwent CABG on 07/26/16 and was doing well at the time of his follow-up appointment with [name] [name] in early June.  Today, [name] [name] reports doing well. He has significantly more energy compared to before his bypass. He is back at work and going to [employer], where he has been doing lots of cardio exercises. He has not taken part in cardiac rehabilitation and feels like he is very active on his own. He has not had any chest pain, shortness of breath, or edema. His incisions have healed well. He denies significant bleeding, remaining on aspirin and clopidogrel. He is noted occasional back pain but feels that this is from overexertion at the gym. He otherwise does not have myalgias. He is concerned about recent labs obtained by his PCP demonstrating a transaminitis, which also occurred in the past with simvastatin.  --------------------------------------------------------------------------------------------------  Cardiovascular History & Procedures: Cardiovascular Problems:  Coronary artery disease status post CABG for unstable angina (07/2016)  Risk Factors:  Known coronary artery disease, hypertension, diabetes mellitus, and male gender  Cath/PCI:  LHC (07/19/16): LMCA normal. LAD 90% proximal and 80% mid stenoses. D1 and D2 both with 90% ostial/proximal lesions. Dominant LCx. OM1 with  80% proximal stenosis. lPDA proximally occluded, with distal vessel filling via left-to-left and right-to-left collaterals. Small, non-dominant RCA with 80% mid/distal stenosis. LVEF 55-65%. LVEDP 7 mmHg.  CV Surgery:  CABG (07/26/16, Dr. Cornelius Moras): LIMA->LAD, RIMA->acute marginal of RCA, SVG->lPDA, SVG->OM1, and SVG->D2.  EP Procedures and Devices:  None  Non-Invasive Evaluation(s):  Exercise MPI (07/19/16): High risk study with large in size, severe anterior/anteroseptal reversible defect and 2 mm of ST depression in inferolateral leads. LVEF 42%.  Recent CV Pertinent Labs: Lab Results  Component Value Date   CHOL 122 11/22/2016   HDL 34 (L) 11/22/2016   LDLCALC 64 11/22/2016   TRIG 119 11/22/2016   CHOLHDL 3.6 11/22/2016   CHOLHDL 4.6 07/20/2016   INR 1.42 07/26/2016   K 4.7 11/22/2016   MG 2.0 07/27/2016   BUN 16 11/22/2016   CREATININE 0.82 11/22/2016    Past medical and surgical history were reviewed and updated in EPIC.  [name] Meds  Medication Sig  . acetaminophen (TYLENOL) 500 MG tablet Take 2 tablets (1,000 mg total) by mouth every 6 (six) hours as needed.  Marland Kitchen aspirin 81 MG chewable tablet Chew 1 tablet (81 mg total) by mouth daily.  . clopidogrel (PLAVIX) 75 MG tablet Take 1 tablet (75 mg total) by mouth daily.  Marland Kitchen glimepiride (AMARYL) 2 MG tablet Take 1 tablet (2 mg total) by mouth daily with breakfast.  . insulin glargine (LANTUS) 100 UNIT/ML injection Inject 50 Units into the skin at bedtime.  Marland Kitchen lisinopril (PRINIVIL,ZESTRIL) 5 MG tablet Take 5 mg by mouth daily.  . metFORMIN (GLUCOPHAGE) 1000 MG tablet Take 1 tablet (1,000 mg total) by mouth 2 (two) times daily with a meal.  . metoprolol tartrate (LOPRESSOR) 25 MG tablet Take  1 tablet (25 mg total) by mouth 2 (two) times daily.  . nitroGLYCERIN (NITROSTAT) 0.4 MG SL tablet Place 0.4 mg under the tongue every 5 (five) minutes as needed for chest pain.  Marland Kitchen rOPINIRole (REQUIP) 1 MG tablet Take 1-2 tablets (1-2 mg  total) by mouth at bedtime.  . sertraline (ZOLOFT) 100 MG tablet Take 1 tablet (100 mg total) by mouth daily.  . sildenafil (REVATIO) 20 MG tablet Take 20 mg by mouth. 3-5 as needed  . [DISCONTINUED] atorvastatin (LIPITOR) 40 MG tablet Take 1 tablet (40 mg total) by mouth daily.    Allergies: Simvastatin  Social History   Social History  . Marital status: Married    Spouse name: N/A  . Number of children: 0  . Years of education: N/A   Occupational History  . [employer]   Social History Main Topics  . Smoking status: Never Smoker  . Smokeless tobacco: Never Used  . Alcohol use No  . Drug use: No  . Sexual activity: Yes   Other Topics Concern  . Not on file   Social History Narrative   Divorced then remarried 2015   2 stepchildren from prior marriage   2 stepsons from this marriage    Family History  Problem Relation Age of Onset  . Heart failure Mother   . Diabetes Mother   . CAD Paternal Grandfather   . Heart attack Paternal Grandfather   . Heart attack Paternal Uncle   . Cancer Neg Hx     Review of Systems: A 12-system review of systems was performed and was negative except as noted in the HPI.  --------------------------------------------------------------------------------------------------  Physical Exam: BP 110/82   Pulse 76   Ht 5' 8.5" (1.74 m)   Wt 183 lb 9.6 oz (83.3 kg)   SpO2 97%   BMI 27.51 kg/m   General:  Overweight man, seated Within the exam room. HEENT: No conjunctival pallor or scleral icterus. Moist mucous membranes.  OP clear. Neck: Supple without lymphadenopathy, thyromegaly, JVD, or HJR. Lungs: Normal work of breathing. Clear to auscultation bilaterally without wheezes or crackles. Heart: Regular rate and rhythm without murmurs, rubs, or gallops. The sternotomy incision is well-healed. Abd: Bowel sounds present. Soft, NT/ND without hepatosplenomegaly Ext: No lower extremity edema. 1+ right radial pulse. 2+ left radial  and bilateral pedal pulses. Skin: Warm and dry without rash.  Lab Results  Component Value Date   WBC 7.1 11/08/2016   HGB 14.0 11/08/2016   HCT 42.5 11/08/2016   MCV 93.7 11/08/2016   PLT 268.0 11/08/2016    Lab Results  Component Value Date   NA 137 11/22/2016   K 4.7 11/22/2016   CL 99 11/22/2016   CO2 24 11/22/2016   BUN 16 11/22/2016   CREATININE 0.82 11/22/2016   GLUCOSE 239 (H) 11/22/2016   ALT 78 (H) 11/22/2016    Lab Results  Component Value Date   CHOL 122 11/22/2016   HDL 34 (L) 11/22/2016   LDLCALC 64 11/22/2016   TRIG 119 11/22/2016   CHOLHDL 3.6 11/22/2016   --------------------------------------------------------------------------------------------------  ASSESSMENT AND PLAN: Coronary artery disease without angina [name] [name] has recovered well from his CABG in May. He is tolerating his medications well with the exception of transaminitis that may be due to ongoing statin therapy. We will continue with dual antiplatelet therapy, given his presentation with unstable angina. We will also continue [name] dose of metoprolol. We will give him a statin holiday, as described below. He  is very active at home and does not wish to be participating cardiac rehabilitation.  Hyperlipidemia and transaminitis LDL at goal (<70). However, recent LFTs obtained by PCP are notable for modest transaminitis. [name] [name] reports similar transaminitis in the past on simvastatin. We will give [name] [name] a one-month statin holiday and then repeat his ALT. If it has returned to baseline, we will consider trying rosuvastatin. If he is also intolerant of rosuvastatin, he will need to be considered for PCSK9 inhibitor therapy.  Diabetes mellitus [name] [name] is currently working on improving his glycemic control with Dr. Alphonsus Sias. I will defer further management to Dr. Alphonsus Sias.  Follow-up: Return to clinic in 3 months  Yvonne Kendall, MD 11/26/2016 9:45 AM

## 2016-11-26 NOTE — Patient Instructions (Signed)
Medication Instructions:  Stop atorvastatin (Lipitor).   Labwork: Your physician recommends that you return for lab work in: 1 month--ALT   Testing/Procedures: None   Follow-Up: Your physician recommends that you schedule a follow-up appointment in: 3 months with Dr End.        If you need a refill on your cardiac medications before your next appointment, please call your pharmacy.

## 2016-11-28 ENCOUNTER — Ambulatory Visit (HOSPITAL_COMMUNITY): Payer: PRIVATE HEALTH INSURANCE

## 2016-11-30 ENCOUNTER — Ambulatory Visit (HOSPITAL_COMMUNITY): Payer: PRIVATE HEALTH INSURANCE

## 2016-12-03 ENCOUNTER — Ambulatory Visit (HOSPITAL_COMMUNITY): Payer: PRIVATE HEALTH INSURANCE

## 2016-12-05 ENCOUNTER — Ambulatory Visit (HOSPITAL_COMMUNITY): Payer: PRIVATE HEALTH INSURANCE

## 2016-12-10 NOTE — Addendum Note (Signed)
Addendum  created 12/10/16 1935 by Heather Roberts, MD   Sign clinical note

## 2016-12-10 NOTE — Anesthesia Postprocedure Evaluation (Signed)
Anesthesia Post Note  Patient: [name] [name]  Procedure(s) Performed: CORONARY ARTERY BYPASS GRAFTING (CABG), ON PUMP, TIMES FIVE, USING BILATERAL INTERNAL MAMMARY ARTERIES AND ENDOSCOPICALLY HARVESTED RIGHT GREATER SAPHENOUS VEIN (N/A Chest) TRANSESOPHAGEAL ECHOCARDIOGRAM (TEE) (N/A )     Anesthesia Post Evaluation  Last Vitals:  Vitals:   07/31/16 0620 07/31/16 0935  BP: (!) 149/88 139/83  Pulse: (!) 106 (!) 113  Resp: 17   Temp: 36.9 C   SpO2: 95%     Last Pain:  Vitals:   07/31/16 0938  TempSrc:   PainSc: 0-No pain   Pain Goal: Patients Stated Pain Goal: 0 (07/29/16 2300)               [name] DANIEL

## 2016-12-14 ENCOUNTER — Other Ambulatory Visit: Payer: 59

## 2016-12-26 ENCOUNTER — Other Ambulatory Visit: Payer: 59 | Admitting: *Deleted

## 2016-12-26 DIAGNOSIS — R740 Nonspecific elevation of levels of transaminase and lactic acid dehydrogenase [LDH]: Principal | ICD-10-CM

## 2016-12-26 DIAGNOSIS — R7401 Elevation of levels of liver transaminase levels: Secondary | ICD-10-CM

## 2016-12-26 LAB — ALT: ALT: 63 IU/L — ABNORMAL HIGH (ref 0–44)

## 2016-12-31 ENCOUNTER — Telehealth: Payer: Self-pay | Admitting: Internal Medicine

## 2016-12-31 ENCOUNTER — Other Ambulatory Visit: Payer: Self-pay

## 2016-12-31 NOTE — Telephone Encounter (Signed)
New message  Pt verbalized that he is calling for the rn  To determine if Dr.End is approving to stop his Plavix

## 2016-12-31 NOTE — Telephone Encounter (Signed)
Copied from CRM #[id number]. Topic: Quick Communication - See Telephone Encounter >> Dec 31, 2016  9:42 AM Guinevere FerrariMorris, Rozelia Catapano E, NT wrote: CRM for notification. See Telephone encounter for:  [facility]

## 2016-12-31 NOTE — Telephone Encounter (Signed)
Pt states dentist is requesting that he hold Plavix for 4 days prior to dental extraction on Friday. I will forward to Dr End for review.

## 2016-12-31 NOTE — Telephone Encounter (Signed)
Patient is calling for his Lantus Rx- he is a new patient of Dr Alphonsus SiasLetvak and the Rx was written by his previous phsycian.

## 2016-12-31 NOTE — Telephone Encounter (Signed)
Discussed Dr Serita KyleEnd's recommendation with patient, he verbalized understanding.

## 2016-12-31 NOTE — Telephone Encounter (Signed)
If Mr. [name] [name]'s dentist feels that holding clopidogrel is important to minimize periprocedural bleeding, I recommend that clopidogrel be held for 5 days prior to the procedure and then restarted at 75 mg daily as soon as it is felt safe to do so by his dentist.  [name], MD [facility] Pager: [phone]

## 2016-12-31 NOTE — Telephone Encounter (Signed)
[name] [name] is calling to find out if he should stop his Plavix before his tooth extraction on Friday . He is not sure what was told to him . Please call

## 2017-01-01 NOTE — Telephone Encounter (Signed)
Approved: okay to fill for a year 

## 2017-01-02 MED ORDER — INSULIN GLARGINE 100 UNIT/ML ~~LOC~~ SOLN: 50.0000 [IU] | Freq: Every day | SUBCUTANEOUS | 11 refills | Status: DC

## 2017-01-03 ENCOUNTER — Other Ambulatory Visit: Payer: Self-pay

## 2017-01-03 NOTE — Telephone Encounter (Signed)
Opened in error

## 2017-01-15 ENCOUNTER — Ambulatory Visit: Payer: Self-pay | Admitting: Internal Medicine

## 2017-01-20 ENCOUNTER — Encounter: Payer: Self-pay | Admitting: Internal Medicine

## 2017-01-21 MED ORDER — GABAPENTIN 300 MG PO CAPS: 300.0000 mg | ORAL_CAPSULE | Freq: Every day | ORAL | 11 refills | Status: DC

## 2017-01-25 ENCOUNTER — Ambulatory Visit: Payer: 59

## 2017-02-09 ENCOUNTER — Encounter: Payer: Self-pay | Admitting: Internal Medicine

## 2017-02-15 ENCOUNTER — Ambulatory Visit: Payer: 59

## 2017-02-21 ENCOUNTER — Ambulatory Visit (INDEPENDENT_AMBULATORY_CARE_PROVIDER_SITE_OTHER): Payer: 59 | Admitting: Internal Medicine

## 2017-02-21 ENCOUNTER — Encounter: Payer: Self-pay | Admitting: Internal Medicine

## 2017-02-21 VITALS — BP 134/92 | HR 80 | Ht 69.0 in | Wt 192.4 lb

## 2017-02-21 DIAGNOSIS — I10 Essential (primary) hypertension: Secondary | ICD-10-CM

## 2017-02-21 DIAGNOSIS — I2510 Atherosclerotic heart disease of native coronary artery without angina pectoris: Secondary | ICD-10-CM

## 2017-02-21 DIAGNOSIS — E785 Hyperlipidemia, unspecified: Secondary | ICD-10-CM

## 2017-02-21 DIAGNOSIS — I255 Ischemic cardiomyopathy: Secondary | ICD-10-CM

## 2017-02-21 LAB — COMPREHENSIVE METABOLIC PANEL
A/G RATIO: 1.8 (ref 1.2–2.2)
ALBUMIN: 4.2 g/dL (ref 3.5–5.5)
ALK PHOS: 77 IU/L (ref 39–117)
ALT: 52 IU/L — ABNORMAL HIGH (ref 0–44)
AST: 33 IU/L (ref 0–40)
BILIRUBIN TOTAL: 0.2 mg/dL (ref 0.0–1.2)
BUN / CREAT RATIO: 27 — AB (ref 9–20)
BUN: 22 mg/dL (ref 6–24)
CHLORIDE: 101 mmol/L (ref 96–106)
CO2: 25 mmol/L (ref 20–29)
Calcium: 9.4 mg/dL (ref 8.7–10.2)
Creatinine, Ser: 0.82 mg/dL (ref 0.76–1.27)
GFR calc non Af Amer: 107 mL/min/{1.73_m2} (ref >59)
GFR, EST AFRICAN AMERICAN: 123 mL/min/{1.73_m2} (ref >59)
GLOBULIN, TOTAL: 2.3 g/dL (ref 1.5–4.5)
GLUCOSE: 98 mg/dL (ref 65–99)
Potassium: 4.3 mmol/L (ref 3.5–5.2)
SODIUM: 139 mmol/L (ref 134–144)
TOTAL PROTEIN: 6.5 g/dL (ref 6.0–8.5)

## 2017-02-21 LAB — LIPID PANEL
Chol/HDL Ratio: 6.2 ratio — ABNORMAL HIGH (ref 0.0–5.0)
Cholesterol, Total: 234 mg/dL — ABNORMAL HIGH (ref 100–199)
HDL: 38 mg/dL — ABNORMAL LOW (ref >39)
LDL Calculated: 157 mg/dL — ABNORMAL HIGH (ref 0–99)
Triglycerides: 195 mg/dL — ABNORMAL HIGH (ref 0–149)
VLDL CHOLESTEROL CAL: 39 mg/dL (ref 5–40)

## 2017-02-21 MED ORDER — LISINOPRIL 10 MG PO TABS: 10.0000 mg | ORAL_TABLET | Freq: Every day | ORAL | 1 refills | Status: DC

## 2017-02-21 NOTE — Progress Notes (Signed)
Follow-up Outpatient Visit Date: 02/21/2017  Primary Care Provider: [name], [name], MD [address] [place] [id number]  Chief Complaint: Follow-up coronary artery disease  HPI:  Mr. [name] [name] is a 46 y.o. year-old male with history of coronary artery disease status post CABG (07/2016), hypertension, type 2 diabetes mellitus, and anxie, who presents for follow-up of coronary artery disease. Last saw him in September, which time he was doing well. Mild transaminitis was noted with atorvastatin. Transaminitis improved with a statin holiday, though it did not return back to normal. I referred Mr. [name] [name] to the lipid clinic, though his appointment has been rescheduled for next week. Currently he is off statin therapy.  Since her last visit, Mr. [name] [name] reports feeling very well. In fact he "feels like a new man." He has not had any chest pain, shortness of breath, palpitations, lightheadedness, or edema. He is exercising regularly, walking on the treadmill for 30 minutes at least 3-4 days a week. He also does some weightlifting at the gym. He notes his home blood pressures are typically 120-130/70-80.  --------------------------------------------------------------------------------------------------  Cardiovascular History & Procedures: Cardiovascular Problems:  Coronary artery disease status post CABG for unstable angina (07/2016)  Risk Factors:  Known coronary artery disease, hypertension, diabetes mellitus, and male gender  Cath/PCI:  LHC (07/19/16): LMCA normal. LAD 90% proximal and 80% mid stenoses. D1 and D2 both with 90% ostial/proximal lesions. Dominant LCx. OM1 with 80% proximal stenosis. lPDA proximally occluded, with distal vessel filling via left-to-left and right-to-left collaterals. Small, non-dominant RCA with 80% mid/distal stenosis. LVEF 55-65%. LVEDP 7 mmHg.  CV Surgery:  CABG (07/26/16, Dr. Cornelius Moraswen): LIMA->LAD, RIMA->acute marginal of RCA, SVG->lPDA, SVG->OM1, and  SVG->D2.  EP Procedures and Devices:  None  Non-Invasive Evaluation(s):  Exercise MPI (07/19/16): High risk study with large in size, severe anterior/anteroseptal reversible defect and 2 mm of ST depression in inferolateral leads. LVEF 42%.  Recent CV Pertinent Labs: Lab Results  Component Value Date   CHOL 122 11/22/2016   HDL 34 (L) 11/22/2016   LDLCALC 64 11/22/2016   TRIG 119 11/22/2016   CHOLHDL 3.6 11/22/2016   CHOLHDL 4.6 07/20/2016   INR 1.42 07/26/2016   K 4.7 11/22/2016   MG 2.0 07/27/2016   BUN 16 11/22/2016   CREATININE 0.82 11/22/2016    Past medical and surgical history were reviewed and updated in EPIC.  Current Meds  Medication Sig  . acetaminophen (TYLENOL) 500 MG tablet Take 2 tablets (1,000 mg total) by mouth every 6 (six) hours as needed.  Marland Kitchen. aspirin 81 MG chewable tablet Chew 1 tablet (81 mg total) by mouth daily.  . clopidogrel (PLAVIX) 75 MG tablet Take 1 tablet (75 mg total) by mouth daily.  Marland Kitchen. gabapentin (NEURONTIN) 300 MG capsule Take 1-2 capsules (300-600 mg total) at bedtime by mouth.  Marland Kitchen. glimepiride (AMARYL) 2 MG tablet Take 1 tablet (2 mg total) by mouth daily with breakfast.  . insulin glargine (LANTUS) 100 UNIT/ML injection Inject 0.5 mLs (50 Units total) into the skin at bedtime.  Marland Kitchen. lisinopril (PRINIVIL,ZESTRIL) 5 MG tablet Take 5 mg by mouth daily.  . metFORMIN (GLUCOPHAGE) 1000 MG tablet Take 1 tablet (1,000 mg total) by mouth 2 (two) times daily with a meal.  . metoprolol tartrate (LOPRESSOR) 25 MG tablet Take 1 tablet (25 mg total) by mouth 2 (two) times daily.  . nitroGLYCERIN (NITROSTAT) 0.4 MG SL tablet Place 0.4 mg under the tongue every 5 (five) minutes as needed for chest pain.  .Marland Kitchen  rOPINIRole (REQUIP) 1 MG tablet Take 1 tablet by mouth 2 (two) times daily.  . sertraline (ZOLOFT) 100 MG tablet Take 1 tablet (100 mg total) by mouth daily.  . sildenafil (REVATIO) 20 MG tablet Take 20 mg by mouth. 3-5 as needed    Allergies:  Simvastatin  Social History   Socioeconomic History  . Marital status: Married    Spouse name: Not on file  . Number of children: 0  . Years of education: Not on file  . Highest education level: Not on file  Social Needs  . Financial resource strain: Not on file  . Food insecurity - worry: Not on file  . Food insecurity - inability: Not on file  . Transportation needs - medical: Not on file  . Transportation needs - non-medical: Not on file  Occupational History  . 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[employer]  Tobacco Use  . Smoking status: Never Smoker  . Smokeless tobacco: Never Used  Substance and Sexual Activity  . Alcohol use: No  . Drug use: No  . Sexual activity: Yes  Other Topics Concern  . Not on file  Social History Narrative   Divorced then remarried 2015   2 stepchildren from prior marriage   2 stepsons from this marriage    Family History  Problem Relation Age of Onset  . Heart failure Mother   . Diabetes Mother   . CAD Paternal Grandfather   . Heart attack Paternal Grandfather   . Heart attack Paternal Uncle   . Cancer Neg Hx     Review of Systems: A 12-system review of systems was performed and was negative except as noted in the HPI.  --------------------------------------------------------------------------------------------------  Physical Exam: BP (!) 134/92   Pulse 80   Ht 5\' 9"  (1.753 m)   Wt 192 lb 6.4 oz (87.3 kg)   SpO2 98%   BMI 28.41 kg/m   General:  Overweight man, seated comfortably in the exam room. HEENT: No conjunctival pallor or scleral icterus. Moist mucous membranes.  OP clear. Neck: Supple without lymphadenopathy, thyromegaly, JVD, or HJR. No carotid bruit. Lungs: Normal work of breathing. Clear to auscultation bilaterally without wheezes or crackles. Heart: Regular rate and rhythm without murmurs, rubs, or gallops. Non-displaced PMI. Abd: Bowel sounds present. Soft, NT/ND without hepatosplenomegaly Ext: No lower  extremity edema. Radial, PT, and DP pulses are 2+ bilaterally. Skin: Warm and dry without rash.  Lab Results  Component Value Date   WBC 7.1 11/08/2016   HGB 14.0 11/08/2016   HCT 42.5 11/08/2016   MCV 93.7 11/08/2016   PLT 268.0 11/08/2016    Lab Results  Component Value Date   NA 137 11/22/2016   K 4.7 11/22/2016   CL 99 11/22/2016   CO2 24 11/22/2016   BUN 16 11/22/2016   CREATININE 0.82 11/22/2016   GLUCOSE 239 (H) 11/22/2016   ALT 63 (H) 12/26/2016    Lab Results  Component Value Date   CHOL 122 11/22/2016   HDL 34 (L) 11/22/2016   LDLCALC 64 11/22/2016   TRIG 119 11/22/2016   CHOLHDL 3.6 11/22/2016    --------------------------------------------------------------------------------------------------  ASSESSMENT AND PLAN: Coronary artery disease without angina Mr. Advincula has recovered well from his CABG for severe multivessel CAD in May. He is exercising regularly. We will continue with aspirin and clopidogrel for a year from the time of his surgery, at which time we will switch to aspirin alone. We will need to continue with aggressive secondary prevention,  as detailed below. I encouraged Mr. [name] [name] to remain active.  Ischemic cardiomyopathy LVEF noted to be mildly reduced prior to CABG. Mr. [name] [name] appears euvolemic and well compensated today. I will increase lisinopril to 10 mg daily, as below. He should continue on his current dose of metoprolol tartrate.  Hypertension Blood pressure is mildly elevated (goal less than 130/80). We have agreed to increase lisinopril to 10 mg daily. I will check renal function and potassium today and again in about a week.  Hyperlipidemia (goal LDL < 70) Mr. [name] [name] is currently off statins given mild transaminitis noted with previous simvastatin and atorvastatin use. He is scheduled to follow-up in the lipid clinic next week. I will check a CMP today to reevaluate his ALT. We will also check a fasting lipid panel to reassess what his LDL  is off statin therapy. I will defer retrial statin (likely low-dose rosuvastatin) versus transitioning to a PCSK9 inhibitor to the lipid clinic.  Follow-up: Return to clinic in 6 months.  Yvonne Kendallhristopher Tereasa Yilmaz, MD 02/21/2017 8:21 AM

## 2017-02-21 NOTE — Patient Instructions (Signed)
Medication Instructions:  Increase lisinopril to 10 mg daily. You can take 2 of your 5mg  tablets daily at the same time and use your current supply.  Labwork: CMET/Lipid profile today.  Your physician recommends that you return for lab work when you come for your Lipid Clinic appointment on February 28, 2017.--BMET.    Testing/Procedures: None   Follow-Up: Your physician wants you to follow-up in: 6 months with Dr End. (June 2019).  You will receive a reminder letter in the mail two months in advance. If you don't receive a letter, please call our office to schedule the follow-up appointment.       If you need a refill on your cardiac medications before your next appointment, please call your pharmacy.

## 2017-02-28 ENCOUNTER — Other Ambulatory Visit: Payer: 59 | Admitting: *Deleted

## 2017-02-28 ENCOUNTER — Ambulatory Visit (INDEPENDENT_AMBULATORY_CARE_PROVIDER_SITE_OTHER): Payer: 59 | Admitting: Pharmacist

## 2017-02-28 VITALS — BP 106/72 | HR 83

## 2017-02-28 DIAGNOSIS — E785 Hyperlipidemia, unspecified: Secondary | ICD-10-CM | POA: Diagnosis not present

## 2017-02-28 DIAGNOSIS — I10 Essential (primary) hypertension: Secondary | ICD-10-CM

## 2017-02-28 DIAGNOSIS — E782 Mixed hyperlipidemia: Secondary | ICD-10-CM | POA: Diagnosis not present

## 2017-02-28 DIAGNOSIS — I2510 Atherosclerotic heart disease of native coronary artery without angina pectoris: Secondary | ICD-10-CM

## 2017-02-28 MED ORDER — ROSUVASTATIN CALCIUM 10 MG PO TABS
10.0000 mg | ORAL_TABLET | Freq: Every day | ORAL | 11 refills | Status: DC
Start: 2017-02-28 — End: 2017-06-25

## 2017-02-28 NOTE — Progress Notes (Signed)
Patient ID: [name] [name]                 DOB: 11/13/1970                    MRN: [id number]     HPI: [name] [name] is a 46 y.o. male patient referred to lipid clinic by Dr End. 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 He has a history of mild transaminitis with atorvastatin. Pt presents today for further lipid management.  Pt presents today in good spirits. He reports previously trying simvastatin a few years ago which caused some LFT elevations. Most recently, he took atorvastatin which increased his ALT mildly to 78. ALT has reduced slightly to 52 since stopping statin therapy. He has never tried any other statins or had any adverse effects with statins other than LFT elevations.  His lisinopril dose was also recently increased by Dr End. His BP readings have been controlled at home and he denies dizziness.  He has had occasionally CBG readings in the 40s at night that wake him up. He is seeing his provider soon for follow up - recommended discussing discontinuing his sulfonylurea due to increased risk of hypoglycemia, particularly when used in combination with insulin therapy.   Current Medications: none Intolerances: atorvastatin 10mg  and 40mg  daily (elevated LFTs) Risk Factors: CAD s/p CABG, unstable angina, HTN, DM, strong family history  LDL goal: 70mg /dL  Diet: Improvements recently - Eating more fish and grilled chicken, vegetables, oatmeal, hard boiled egg in the morning, wheat bread.  Exercise: Walks on the treadmill for 30 minutes 3-4 days per week. Weightlifting at the gym.  Family History: Mother with hx of heart failure and DM, paternal uncle and grandfather with hx of MI in their 7140s.  Social History: Denies alcohol, tobacco, or illicit drug use.  Labs: 02/21/17: TC 234, TG 195, HDL 38, LDL 157, ALT 52 (no therapy) 11/22/16: TC 122, TG 119, HDL 34, LDL 64, ALT 78 (atorvastatin 40mg  daily)  Past Medical  History:  Diagnosis Date  . Anxiety   . Coronary artery calcification    a. noted on CT angio 07/16/16  . Coronary artery disease   . High cholesterol   . HTN (hypertension)   . OSA on CPAP    "don't wear it that much" (07/19/2016)  . 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 Type II diabetes mellitus (HCC)     Current Outpatient Medications on File Prior to Visit  Medication Sig Dispense Refill  . acetaminophen (TYLENOL) 500 MG tablet Take 2 tablets (1,000 mg total) by mouth every 6 (six) hours as needed. 30 tablet 0  . aspirin 81 MG chewable tablet Chew 1 tablet (81 mg total) by mouth daily. 30 tablet 0  . clopidogrel (PLAVIX) 75 MG tablet Take 1 tablet (75 mg total) by mouth daily. 90 tablet 1  . gabapentin (NEURONTIN) 300 MG capsule Take 1-2 capsules (300-600 mg total) at bedtime by mouth. 60 capsule 11  . glimepiride (AMARYL) 2 MG tablet Take 1 tablet (2 mg total) by mouth daily with breakfast. 90 tablet 3  . insulin glargine (LANTUS) 100 UNIT/ML injection Inject 0.5 mLs (50 Units total) into the skin at bedtime. 10 mL 11  . lisinopril (PRINIVIL,ZESTRIL) 10 MG tablet Take 1 tablet (10  mg total) by mouth daily. 90 tablet 1  . metFORMIN (GLUCOPHAGE) 1000 MG tablet Take 1 tablet (1,000 mg total) by mouth 2 (two) times daily with a meal. 180 tablet 3  . metoprolol tartrate (LOPRESSOR) 25 MG tablet Take 1 tablet (25 mg total) by mouth 2 (two) times daily. 60 tablet 5  . nitroGLYCERIN (NITROSTAT) 0.4 MG SL tablet Place 0.4 mg under the tongue every 5 (five) minutes as needed for chest pain.    Marland Kitchen. rOPINIRole (REQUIP) 1 MG tablet Take 1 tablet by mouth 2 (two) times daily.    . sertraline (ZOLOFT) 100 MG tablet Take 1 tablet (100 mg total) by mouth daily. 90 tablet 3  . sildenafil (REVATIO) 20 MG tablet Take 20 mg by mouth. 3-5 as needed     No current facility-administered medications on file prior to visit.      Allergies  Allergen Reactions  . Simvastatin Other (See Comments)    Elevated LFTs?    Assessment/Plan:  1. Hyperlipidemia - Baseline LDL above goal < 70 due to hx of ASCVD. Pt has previously experienced mild ALT elevations into the 70s on atorvastatin and simvastatin. Discussed rechallenging with low dose rosuvastatin vs PCSK9i therapy. Pt prefers to try another statin before moving on to injections. Will start rosuvastatin 10mg  daily and recheck LFTs in 1 month. If LFTs are stable and remain less than 3x ULN, will plan to increase dose to 20mg  daily since pt will require 50% LDL reduction to bring LDL to goal. Can pursue PCSK9i in the future if necessary.  2. Hypertension - BP a bit low in clinic today at 106/72. Advised pt to monitor his BP at home over the next week and call clinic if his systolic BP remains consistently <105. We can reduce lisinopril back to 5mg  if this occurs.  3. DM - Pt mentioned occasional episodes of nighttime hypoglycemia with CBGs in the 40s that wake him up. Recommended that pt follow up with his PCP to discuss potentially discontinuing his glimepiride which can cause hypoglycemia, particularly when used in conjunction with insulin therapy.   Myanna Ziesmer E. Sheba Whaling, PharmD, CPP, BCACP [facility] [address], [place], [place] [address] Phone: [phone]; Fax: [phone] 02/28/2017 3:38 PM

## 2017-02-28 NOTE — Patient Instructions (Addendum)
It was nice to meet you today  Start taking rosuvastatin (Crestor) 10mg  once daily for your cholesterol  Recheck your liver enzymes in 1 month - if everything is stable, we will increase your Crestor to 20mg  daily  Keep an eye on your blood pressure - call clinic if your top number stays consistently less than 105  Call Megan in the pharmacy clinic with any concerns #[phone]

## 2017-03-01 LAB — BASIC METABOLIC PANEL
BUN/Creatinine Ratio: 34 — ABNORMAL HIGH (ref 9–20)
BUN: 30 mg/dL — ABNORMAL HIGH (ref 6–24)
CALCIUM: 9.5 mg/dL (ref 8.7–10.2)
CHLORIDE: 102 mmol/L (ref 96–106)
CO2: 21 mmol/L (ref 20–29)
Creatinine, Ser: 0.89 mg/dL (ref 0.76–1.27)
GFR calc non Af Amer: 103 mL/min/{1.73_m2} (ref >59)
GFR, EST AFRICAN AMERICAN: 119 mL/min/{1.73_m2} (ref >59)
GLUCOSE: 152 mg/dL — AB (ref 65–99)
POTASSIUM: 4.4 mmol/L (ref 3.5–5.2)
Sodium: 139 mmol/L (ref 134–144)

## 2017-04-04 ENCOUNTER — Other Ambulatory Visit: Payer: 59

## 2017-04-04 DIAGNOSIS — E782 Mixed hyperlipidemia: Secondary | ICD-10-CM

## 2017-04-05 LAB — HEPATIC FUNCTION PANEL
ALBUMIN: 4.3 g/dL (ref 3.5–5.5)
ALT: 50 IU/L — ABNORMAL HIGH (ref 0–44)
AST: 41 IU/L — ABNORMAL HIGH (ref 0–40)
Alkaline Phosphatase: 58 IU/L (ref 39–117)
BILIRUBIN TOTAL: 0.4 mg/dL (ref 0.0–1.2)
BILIRUBIN, DIRECT: 0.13 mg/dL (ref 0.00–0.40)
Total Protein: 6.6 g/dL (ref 6.0–8.5)

## 2017-04-09 ENCOUNTER — Telehealth: Payer: Self-pay

## 2017-04-09 DIAGNOSIS — E785 Hyperlipidemia, unspecified: Secondary | ICD-10-CM

## 2017-04-09 NOTE — Telephone Encounter (Signed)
Spoke with patient and informed him of lab results and Dr. Serita KyleEnd's suggestion.  Patient will be coming in on February 19th for Hepatic Liver Function labs.  Patient verbalized understanding.

## 2017-04-15 ENCOUNTER — Other Ambulatory Visit: Payer: Self-pay | Admitting: Internal Medicine

## 2017-04-16 NOTE — Telephone Encounter (Signed)
Refill Request.  

## 2017-04-26 ENCOUNTER — Other Ambulatory Visit: Payer: Self-pay | Admitting: Internal Medicine

## 2017-04-26 MED ORDER — ROPINIROLE HCL 1 MG PO TABS: 1.0000 mg | ORAL_TABLET | Freq: Two times a day (BID) | ORAL | 3 refills | Status: DC

## 2017-04-26 NOTE — Telephone Encounter (Signed)
Approved: okay for a year 

## 2017-04-26 NOTE — Telephone Encounter (Signed)
[facility]; sent to [facility] in error (contacted [facility]). reponinirole refill Last OV: 11/08/16 Last Refill:12/09/16 [facility] [address] [place]  Pt also request change in dose; see CRM [phone]

## 2017-04-26 NOTE — Telephone Encounter (Signed)
Sent in error to PCP per [facility] call

## 2017-04-26 NOTE — Addendum Note (Signed)
Addended by: Sydell AxonLAWS, REGINA C on: 04/26/2017 01:12 PM   Modules accepted: Orders

## 2017-04-26 NOTE — Telephone Encounter (Signed)
Copied from CRM [phone]. Topic: Quick Communication - Rx Refill/Question >> Apr 26, 2017 11:58 AM Floria RavelingStovall, Shana A wrote: Medication: rOPINIRole (REQUIP) 1 MG tablet [[id number]][[id number]]    Has the patient contacted their pharmacy? No    (Agent: If no, request that the patient contact the pharmacy for the refill.)  Preferred Pharmacy (with phone number or street name): [facility] [address] - Pt would like this increased.  He said that he is up to talking 3 a night .  Can this be increased?     Agent: Please be advised that RX refills may take up to 3 business days. We ask that you follow-up with your pharmacy.

## 2017-04-26 NOTE — Telephone Encounter (Signed)
ropininirole refill Last OV: 11/08/16 Last Refill:11/08/16 [facility] [address]

## 2017-04-30 ENCOUNTER — Other Ambulatory Visit: Payer: 59

## 2017-04-30 DIAGNOSIS — E785 Hyperlipidemia, unspecified: Secondary | ICD-10-CM

## 2017-04-30 LAB — HEPATIC FUNCTION PANEL
ALT: 61 IU/L — ABNORMAL HIGH (ref 0–44)
AST: 37 IU/L (ref 0–40)
Albumin: 4.4 g/dL (ref 3.5–5.5)
Alkaline Phosphatase: 89 IU/L (ref 39–117)
BILIRUBIN TOTAL: 0.4 mg/dL (ref 0.0–1.2)
BILIRUBIN, DIRECT: 0.13 mg/dL (ref 0.00–0.40)
TOTAL PROTEIN: 6.9 g/dL (ref 6.0–8.5)

## 2017-05-03 ENCOUNTER — Telehealth: Payer: Self-pay

## 2017-05-03 DIAGNOSIS — E785 Hyperlipidemia, unspecified: Secondary | ICD-10-CM

## 2017-05-06 ENCOUNTER — Telehealth: Payer: Self-pay | Admitting: Internal Medicine

## 2017-05-06 MED ORDER — ROPINIROLE HCL 1 MG PO TABS: 1.0000 mg | ORAL_TABLET | Freq: Three times a day (TID) | ORAL | 3 refills | Status: DC

## 2017-05-06 NOTE — Telephone Encounter (Signed)
Copied from CRM [id number]. Topic: General - Other >> May 06, 2017 11:22 AM Gerrianne ScalePayne, Edelmiro Innocent L wrote: Reason for CRM: patient states that the rOPINIRole (REQUIP) 1 MG tablet that he takes is it suppose to be 3 times daily because he states that the provider told him that he can take up to 3 tablets a day he would like for someone to give him a call back to how many tablets a day he suppose to take

## 2017-05-06 NOTE — Telephone Encounter (Signed)
Informed him of lab results

## 2017-05-06 NOTE — Telephone Encounter (Signed)
If he is taking it 1mg  tid---okay to refill like that

## 2017-05-06 NOTE — Telephone Encounter (Signed)
Left pt on message on vm per dpr that I sending in a new rx for ropinirole with new sig of 3 a day

## 2017-05-09 ENCOUNTER — Ambulatory Visit: Payer: 59 | Admitting: Internal Medicine

## 2017-05-20 ENCOUNTER — Encounter: Payer: Self-pay | Admitting: Internal Medicine

## 2017-05-20 ENCOUNTER — Ambulatory Visit (INDEPENDENT_AMBULATORY_CARE_PROVIDER_SITE_OTHER): Payer: 59 | Admitting: Internal Medicine

## 2017-05-20 VITALS — BP 104/64 | HR 70 | Temp 98.5°F | Wt 194.0 lb

## 2017-05-20 DIAGNOSIS — I10 Essential (primary) hypertension: Secondary | ICD-10-CM

## 2017-05-20 DIAGNOSIS — Z794 Long term (current) use of insulin: Secondary | ICD-10-CM | POA: Diagnosis not present

## 2017-05-20 DIAGNOSIS — I2510 Atherosclerotic heart disease of native coronary artery without angina pectoris: Secondary | ICD-10-CM

## 2017-05-20 DIAGNOSIS — G2581 Restless legs syndrome: Secondary | ICD-10-CM | POA: Diagnosis not present

## 2017-05-20 DIAGNOSIS — E1159 Type 2 diabetes mellitus with other circulatory complications: Secondary | ICD-10-CM

## 2017-05-20 LAB — HEMOGLOBIN A1C: Hgb A1c MFr Bld: 8.6 % — ABNORMAL HIGH (ref 4.6–6.5)

## 2017-05-20 NOTE — Assessment & Plan Note (Signed)
Will try the requip earlier--and then consider trying increase 4mg 

## 2017-05-20 NOTE — Progress Notes (Signed)
Subjective:    Patient ID: [name] [name], male    DOB: 1970-05-21, 47 y.o.   MRN: [id number]  HPI Here for follow up of diabetes and other chronic health problems  Doing well with his coronary disease Still making some minor adjustments with the cholesterol medication-- due to elevated LFTs (but only minor elevations) No chest pain No SOB  More trouble with sugar lately No taking 25 bid of the lantus Had been under 100 fasting for a while---but now some up to 200 In transition--building new house, more commuting  RLS still bothers him a lot The requip has helped--- taking 3mg  at bedtime Gabapentin didn't work out  Current Outpatient Medications on File Prior to Visit  Medication Sig Dispense Refill  . acetaminophen (TYLENOL) 500 MG tablet Take 2 tablets (1,000 mg total) by mouth every 6 (six) hours as needed. 30 tablet 0  . aspirin 81 MG chewable tablet Chew 1 tablet (81 mg total) by mouth daily. 30 tablet 0  . clopidogrel (PLAVIX) 75 MG tablet Take 1 tablet (75 mg total) by mouth daily. 90 tablet 1  . glimepiride (AMARYL) 2 MG tablet Take 1 tablet (2 mg total) by mouth daily with breakfast. 90 tablet 3  . insulin glargine (LANTUS) 100 UNIT/ML injection Inject 0.5 mLs (50 Units total) into the skin at bedtime. (Patient taking differently: Inject 25 Units into the skin 2 (two) times daily. ) 10 mL 11  . lisinopril (PRINIVIL,ZESTRIL) 10 MG tablet Take 1 tablet (10 mg total) by mouth daily. 90 tablet 1  . metFORMIN (GLUCOPHAGE) 1000 MG tablet Take 1 tablet (1,000 mg total) by mouth 2 (two) times daily with a meal. 180 tablet 3  . metoprolol tartrate (LOPRESSOR) 25 MG tablet TAKE 1 TABLET BY MOUTH TWICE DAILY 180 tablet 3  . nitroGLYCERIN (NITROSTAT) 0.4 MG SL tablet Place 0.4 mg under the tongue every 5 (five) minutes as needed for chest pain.    Marland Kitchen rOPINIRole (REQUIP) 1 MG tablet Take 1 tablet (1 mg total) by mouth 3 (three) times daily. 270 tablet 3  . rosuvastatin (CRESTOR) 10 MG  tablet Take 1 tablet (10 mg total) by mouth daily. 30 tablet 11  . sertraline (ZOLOFT) 100 MG tablet Take 1 tablet (100 mg total) by mouth daily. 90 tablet 3  . sildenafil (REVATIO) 20 MG tablet Take 20 mg by mouth. 3-5 as needed     No current facility-administered medications on file prior to visit.     Allergies  Allergen Reactions  . Simvastatin Other (See Comments)    Elevated LFTs?    Past Medical History:  Diagnosis Date  . Anxiety   . Coronary artery calcification    a. noted on CT angio 07/16/16  . Coronary artery disease   . High cholesterol   . HTN (hypertension)   . OSA on CPAP    "don't wear it that much" (07/19/2016)  . 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 Type II diabetes mellitus (HCC)     Past Surgical History:  Procedure Laterality Date  . CARDIAC CATHETERIZATION  07/19/2016  . CORONARY ARTERY BYPASS GRAFT N/A 07/26/2016   Procedure: CORONARY ARTERY BYPASS GRAFTING (CABG), ON PUMP, TIMES FIVE, USING BILATERAL INTERNAL MAMMARY ARTERIES AND ENDOSCOPICALLY HARVESTED RIGHT GREATER SAPHENOUS VEIN;  Surgeon: Purcell Nails, MD;  Location: [facility];  Service: Open Heart Surgery;  Laterality: N/A;  LIMA to LAD RIMA to Acute Marginal SVG to PDA SVG to Diag 2 SVG to OM1  . LEFT HEART CATH AND CORONARY ANGIOGRAPHY N/A 07/19/2016   Procedure: Left Heart Cath and Coronary Angiography;  Surgeon: Corky CraftsVaranasi, Jayadeep S, MD;  [facility];  Service: Cardiovascular;  Laterality: N/A;  . ORCHIOPEXY Right ~ 1980   "undescending testicle"  . TEE WITHOUT CARDIOVERSION N/A 07/26/2016   Procedure: TRANSESOPHAGEAL ECHOCARDIOGRAM (TEE);  Surgeon: Purcell Nailswen, Clarence H, MD;  [facility];  Service: Open Heart Surgery;  Laterality: N/A;    Family History  Problem Relation Age of Onset  . Heart failure Mother   . Diabetes Mother   . CAD Paternal Grandfather   . Heart attack Paternal Grandfather    . Heart attack Paternal Uncle   . Cancer Neg Hx     Social History   Socioeconomic History  . Marital status: Married    Spouse name: Not on file  . Number of children: 0  . Years of education: Not on file  . Highest education level: Not on file  Social Needs  . Financial resource strain: Not on file  . Food insecurity - worry: Not on file  . Food insecurity - inability: Not on file  . Transportation needs - medical: Not on file  . Transportation needs - non-medical: Not on file  Occupational History  . Occupation: Secretary/administratorest Control    Employer: [employer]  Tobacco Use  . Smoking status: Never Smoker  . Smokeless tobacco: Never Used  Substance and Sexual Activity  . Alcohol use: No  . Drug use: No  . Sexual activity: Yes  Other Topics Concern  . Not on file  Social History Narrative   Divorced then remarried 2015   2 stepchildren from prior marriage   2 stepsons from this marriage   Review of Systems Appetite is okay Weight is up 15# in past 6 months Doing weight work--- discussed doing more cardio again    Objective:   Physical Exam  Constitutional: No distress.  Neck: No thyromegaly present.  Cardiovascular: Normal rate, regular rhythm, normal heart sounds and intact distal pulses. Exam reveals no gallop.  No murmur heard. Pulmonary/Chest: Effort normal and breath sounds normal. No respiratory distress. He has no wheezes. He has no rales.  Musculoskeletal: He exhibits no edema or tenderness.  Lymphadenopathy:    He has no cervical adenopathy.  Skin:  No foot lesions  Psychiatric: He has a normal mood and affect. His behavior is normal.          Assessment & Plan:

## 2017-05-20 NOTE — Assessment & Plan Note (Signed)
BP Readings from Last 3 Encounters:  05/20/17 104/64  02/28/17 106/72  02/21/17 (!) 134/92   Good control

## 2017-05-20 NOTE — Assessment & Plan Note (Signed)
Doing well from cardiac standpoint

## 2017-05-20 NOTE — Assessment & Plan Note (Signed)
Has gotten lax---discussed cutting out snacks at night, etc Needs to add back cardio Will add januvia or gliptin if still >>8%

## 2017-05-29 ENCOUNTER — Other Ambulatory Visit: Payer: 59

## 2017-06-03 ENCOUNTER — Other Ambulatory Visit: Payer: 59

## 2017-06-06 ENCOUNTER — Telehealth: Payer: Self-pay | Admitting: Internal Medicine

## 2017-06-06 MED ORDER — CLOPIDOGREL BISULFATE 75 MG PO TABS: 75.0000 mg | ORAL_TABLET | Freq: Every day | ORAL | 0 refills | Status: DC

## 2017-06-06 NOTE — Telephone Encounter (Signed)
Copied from CRM [phone]. Topic: Quick Communication - See Telephone Encounter >> Jun 06, 2017  2:04 PM Windy KalataMichael, Dow Blahnik L, NT wrote: CRM for notification. See Telephone encounter for: 06/06/17.  Patient is calling requesting a refill on clopidogrel (PLAVIX) 75 MG tablet Patient states he is completely out and it slipped his mind.  [facility] [address] - [place], [place] - [address]  [address] [place] [address]  Phone: [phone] Fax: [phone]

## 2017-06-10 ENCOUNTER — Other Ambulatory Visit: Payer: 59

## 2017-06-24 ENCOUNTER — Other Ambulatory Visit: Payer: 59 | Admitting: *Deleted

## 2017-06-24 ENCOUNTER — Other Ambulatory Visit: Payer: Self-pay | Admitting: Internal Medicine

## 2017-06-24 LAB — LIPID PANEL
CHOL/HDL RATIO: 3.2 ratio (ref 0.0–5.0)
Cholesterol, Total: 128 mg/dL (ref 100–199)
HDL: 40 mg/dL (ref >39)
LDL Calculated: 73 mg/dL (ref 0–99)
Triglycerides: 76 mg/dL (ref 0–149)
VLDL Cholesterol Cal: 15 mg/dL (ref 5–40)

## 2017-06-25 ENCOUNTER — Other Ambulatory Visit: Payer: Self-pay | Admitting: *Deleted

## 2017-06-25 DIAGNOSIS — E785 Hyperlipidemia, unspecified: Secondary | ICD-10-CM

## 2017-06-25 MED ORDER — ROSUVASTATIN CALCIUM 20 MG PO TABS: 20.0000 mg | ORAL_TABLET | Freq: Every day | ORAL | 3 refills | Status: DC

## 2017-06-27 ENCOUNTER — Telehealth: Payer: Self-pay | Admitting: Internal Medicine

## 2017-06-27 NOTE — Telephone Encounter (Signed)
Message  Left  On Patients  VM that the  Lisinopril  Refill  Requested  Was  Written by his  Cardiologist   DR  End  And  That he  Should  First  Check  With  Him

## 2017-06-27 NOTE — Telephone Encounter (Signed)
Copied from CRM [phone]. Topic: Quick Communication - See Telephone Encounter >> Jun 27, 2017  4:05 PM Trula SladeWalter, Linda F wrote: CRM for notification. See Telephone encounter for: 06/27/17. Patient would like a refill of his lisinopril (PRINIVIL,ZESTRIL) 10 MG tablet medication and have it sent to [facility] [address] [place].

## 2017-06-28 ENCOUNTER — Encounter: Payer: Self-pay | Admitting: Internal Medicine

## 2017-06-28 ENCOUNTER — Other Ambulatory Visit: Payer: Self-pay | Admitting: Internal Medicine

## 2017-06-28 MED ORDER — LISINOPRIL 10 MG PO TABS: 10.0000 mg | ORAL_TABLET | Freq: Every day | ORAL | 2 refills | Status: DC

## 2017-07-25 ENCOUNTER — Other Ambulatory Visit: Payer: 59

## 2017-07-29 ENCOUNTER — Other Ambulatory Visit: Payer: 59

## 2017-07-30 ENCOUNTER — Other Ambulatory Visit: Payer: 59

## 2017-08-22 ENCOUNTER — Ambulatory Visit: Payer: 59 | Admitting: Internal Medicine

## 2017-08-29 ENCOUNTER — Other Ambulatory Visit: Payer: Self-pay | Admitting: Internal Medicine

## 2017-08-29 MED ORDER — MEBENDAZOLE 100 MG PO CHEW: 100.0000 mg | CHEWABLE_TABLET | Freq: Once | ORAL | 0 refills | Status: AC

## 2017-09-05 ENCOUNTER — Other Ambulatory Visit: Payer: Self-pay | Admitting: Internal Medicine

## 2017-09-06 ENCOUNTER — Ambulatory Visit: Payer: 59 | Admitting: Internal Medicine

## 2017-09-17 ENCOUNTER — Ambulatory Visit: Payer: 59 | Admitting: Internal Medicine

## 2017-09-17 ENCOUNTER — Other Ambulatory Visit: Payer: 59

## 2017-09-17 DIAGNOSIS — E785 Hyperlipidemia, unspecified: Secondary | ICD-10-CM

## 2017-09-17 LAB — LIPID PANEL
CHOLESTEROL TOTAL: 141 mg/dL (ref 100–199)
Chol/HDL Ratio: 3.9 ratio (ref 0.0–5.0)
HDL: 36 mg/dL — AB (ref >39)
LDL Calculated: 83 mg/dL (ref 0–99)
TRIGLYCERIDES: 109 mg/dL (ref 0–149)
VLDL CHOLESTEROL CAL: 22 mg/dL (ref 5–40)

## 2017-09-17 LAB — ALT: ALT: 48 IU/L — ABNORMAL HIGH (ref 0–44)

## 2017-09-19 ENCOUNTER — Encounter: Payer: Self-pay | Admitting: Internal Medicine

## 2017-09-19 ENCOUNTER — Ambulatory Visit (INDEPENDENT_AMBULATORY_CARE_PROVIDER_SITE_OTHER): Payer: 59 | Admitting: Internal Medicine

## 2017-09-19 VITALS — BP 110/72 | HR 79 | Ht 69.0 in | Wt 188.6 lb

## 2017-09-19 DIAGNOSIS — Z79899 Other long term (current) drug therapy: Secondary | ICD-10-CM

## 2017-09-19 DIAGNOSIS — E782 Mixed hyperlipidemia: Secondary | ICD-10-CM | POA: Diagnosis not present

## 2017-09-19 DIAGNOSIS — I10 Essential (primary) hypertension: Secondary | ICD-10-CM

## 2017-09-19 DIAGNOSIS — I255 Ischemic cardiomyopathy: Secondary | ICD-10-CM | POA: Diagnosis not present

## 2017-09-19 DIAGNOSIS — I2510 Atherosclerotic heart disease of native coronary artery without angina pectoris: Secondary | ICD-10-CM

## 2017-09-19 NOTE — Patient Instructions (Addendum)
Medication Instructions:  Your physician recommends that you continue on your current medications as directed. Please refer to the Current Medication list given to you today.  * If you need a refill on your cardiac medications before your next appointment, please call your pharmacy.   Labwork: Your physician recommends that you return for lab work in: 3 months for Lipid panel and ALT *We will only notify you of abnormal results, otherwise continue current treatment plan.  Testing/Procedures: None ordered  Follow-Up: Your physician wants you to follow-up in: 6 months with Dr. Okey Dupre in [place]. You will receive a reminder letter in the mail two months in advance. If you don't receive a letter, please call our office to schedule the follow-up appointment.  *Please note that any paperwork needing to be filled out by the provider will need to be addressed at the front desk prior to seeing the provider. Please note that any FMLA, disability or other documents regarding health condition is subject to a $25.00 charge that must be received prior to completion of paperwork in the form of a money order or check.  Thank you for choosing [facility]!!   Dory Horn, RN [phone]  Any Other Special Instructions Will Be Listed Below (If Applicable). Exercise Lose Weight   Mediterranean Diet A Mediterranean diet refers to food and lifestyle choices that are based on the traditions of countries located on the [place]. This way of eating has been shown to help prevent certain conditions and improve outcomes for people who have chronic diseases, like kidney disease and heart disease. What are tips for following this plan? Lifestyle  Cook and eat meals together with your family, when possible.  Drink enough fluid to keep your urine clear or pale yellow.  Be physically active every day. This includes: ? Aerobic exercise like running or swimming. ? Leisure activities like  gardening, walking, or housework.  Get 7-8 hours of sleep each night.  If recommended by your health care provider, drink red wine in moderation. This means 1 glass a day for nonpregnant women and 2 glasses a day for men. A glass of wine equals 5 oz (150 mL). Reading food labels  Check the serving size of packaged foods. For foods such as rice and pasta, the serving size refers to the amount of cooked product, not dry.  Check the total fat in packaged foods. Avoid foods that have saturated fat or trans fats.  Check the ingredients list for added sugars, such as corn syrup. Shopping  At the grocery store, buy most of your food from the areas near the walls of the store. This includes: ? Fresh fruits and vegetables (produce). ? Grains, beans, nuts, and seeds. Some of these may be available in unpackaged forms or large amounts (in bulk). ? Fresh seafood. ? Poultry and eggs. ? Low-fat dairy products.  Buy whole ingredients instead of prepackaged foods.  Buy fresh fruits and vegetables in-season from local farmers markets.  Buy frozen fruits and vegetables in resealable bags.  If you do not have access to quality fresh seafood, buy precooked frozen shrimp or canned fish, such as tuna, salmon, or sardines.  Buy small amounts of raw or cooked vegetables, salads, or olives from the deli or salad bar at your store.  Stock your pantry so you always have certain foods on hand, such as olive oil, canned tuna, canned tomatoes, rice, pasta, and beans. Cooking  Cook foods with extra-virgin olive oil instead of using butter or  other vegetable oils.  Have meat as a side dish, and have vegetables or grains as your main dish. This means having meat in small portions or adding small amounts of meat to foods like pasta or stew.  Use beans or vegetables instead of meat in common dishes like chili or lasagna.  Experiment with different cooking methods. Try roasting or broiling vegetables instead of  steaming or sauteing them.  Add frozen vegetables to soups, stews, pasta, or rice.  Add nuts or seeds for added healthy fat at each meal. You can add these to yogurt, salads, or vegetable dishes.  Marinate fish or vegetables using olive oil, lemon juice, garlic, and fresh herbs. Meal planning  Plan to eat 1 vegetarian meal one day each week. Try to work up to 2 vegetarian meals, if possible.  Eat seafood 2 or more times a week.  Have healthy snacks readily available, such as: ? Vegetable sticks with hummus. ? [place] yogurt. ? Fruit and nut trail mix.  Eat balanced meals throughout the week. This includes: ? Fruit: 2-3 servings a day ? Vegetables: 4-5 servings a day ? Low-fat dairy: 2 servings a day ? Fish, poultry, or lean meat: 1 serving a day ? Beans and legumes: 2 or more servings a week ? Nuts and seeds: 1-2 servings a day ? Whole grains: 6-8 servings a day ? Extra-virgin olive oil: 3-4 servings a day  Limit red meat and sweets to only a few servings a month What are my food choices?  Mediterranean diet ? Recommended ? Grains: Whole-grain pasta. Marchesi rice. Bulgar wheat. Polenta. Couscous. Whole-wheat bread. Orpah Cobb. ? Vegetables: Artichokes. Beets. Broccoli. Cabbage. Carrots. Eggplant. Green beans. Chard. Kale. Spinach. Onions. Leeks. Peas. Squash. Tomatoes. Peppers. Radishes. ? Fruits: Apples. Apricots. Avocado. Berries. Bananas. Cherries. Dates. Figs. Grapes. Lemons. Melon. Oranges. Peaches. Plums. Pomegranate. ? Meats and other protein foods: Beans. Almonds. Sunflower seeds. Pine nuts. Peanuts. Cod. Salmon. Scallops. Shrimp. Tuna. Tilapia. Clams. Oysters. Eggs. ? Dairy: Low-fat milk. Cheese. Greek yogurt. ? Beverages: Water. Red wine. Herbal tea. ? Fats and oils: Extra virgin olive oil. Avocado oil. Grape seed oil. ? Sweets and desserts: [place] yogurt with honey. Baked apples. Poached pears. Trail mix. ? Seasoning and other foods: Basil. Cilantro. Coriander.  Cumin. Mint. Parsley. Sage. Rosemary. Tarragon. Garlic. Oregano. Thyme. Pepper. Balsalmic vinegar. Tahini. Hummus. Tomato sauce. Olives. Mushrooms. ? Limit these ? Grains: Prepackaged pasta or rice dishes. Prepackaged cereal with added sugar. ? Vegetables: Deep fried potatoes (french fries). ? Fruits: Fruit canned in syrup. ? Meats and other protein foods: Beef. Pork. Lamb. Poultry with skin. Hot dogs. Tomasa Blase. ? Dairy: Ice cream. Sour cream. Whole milk. ? Beverages: Juice. Sugar-sweetened soft drinks. Beer. Liquor and spirits. ? Fats and oils: Butter. Canola oil. Vegetable oil. Beef fat (tallow). Lard. ? Sweets and desserts: Cookies. Cakes. Pies. Candy. ? Seasoning and other foods: Mayonnaise. Premade sauces and marinades. ? The items listed may not be a complete list. Talk with your dietitian about what dietary choices are right for you. Summary  The Mediterranean diet includes both food and lifestyle choices.  Eat a variety of fresh fruits and vegetables, beans, nuts, seeds, and whole grains.  Limit the amount of red meat and sweets that you eat.  Talk with your health care provider about whether it is safe for you to drink red wine in moderation. This means 1 glass a day for nonpregnant women and 2 glasses a day for men. A glass of wine equals 5  oz (150 mL). This information is not intended to replace advice given to you by your health care provider. Make sure you discuss any questions you have with your health care provider. Document Released: 10/20/2015 Document Revised: 11/22/2015 Document Reviewed: 10/20/2015 Elsevier Interactive Patient Education  [employer]2018 [employer].

## 2017-09-19 NOTE — Progress Notes (Signed)
Follow-up Outpatient Visit Date: 09/19/2017  Primary Care Provider: Karie Schwalbe, MD [address] [place] [address]  Chief Complaint: Follow-up CAD and hyperlipidemia  HPI:  [name] [name] is a 47 y.o. year-old male with history of coronary artery disease status post CABG (07/2016), hypertension, type 2 diabetes mellitus, and anxiety, who presents for follow-up of CAD and hyperlipidemia.  I last saw him in December, at which time he reported feeling very well he was exercising regularly without difficulty.  Lipid therapy had been complicated by mild transaminitis in the setting of statin use.  He subsequently followed up with the lipid clinic and has been on gradually escalating doses of rosuvastatin.  Most recent lipid panel showed minimally elevated ALT of 48 (ULN 44).  LDL just above goal at 83.  Today, [name] [name] is without complaints, denying chest pan, shortness of breath, palpitations, lightheadedness, orthopnea, and PND.  He has put on a bit of weight and also notes that his diabetes has been suboptimally controlled.  He is tolerating rosuvastatin 20 mg daily without side effects.  --------------------------------------------------------------------------------------------------  Cardiovascular History & Procedures: Cardiovascular Problems:  Coronary artery disease status post CABG for unstable angina (07/2016)  Risk Factors:  Known coronary artery disease, hypertension, diabetes mellitus, and male gender  Cath/PCI:  LHC (07/19/16): LMCA normal. LAD 90% proximal and 80% mid stenoses. D1 and D2 both with 90% ostial/proximal lesions. Dominant LCx. OM1 with 80% proximal stenosis. lPDA proximally occluded, with distal vessel filling via left-to-left and right-to-left collaterals. Small, non-dominant RCA with 80% mid/distal stenosis. LVEF 55-65%. LVEDP 7 mmHg.  CV Surgery:  CABG (07/26/16, Dr. Cornelius Moras): LIMA->LAD, RIMA->acute marginal of RCA, SVG->lPDA, SVG->OM1, and  SVG->D2.  EP Procedures and Devices:  None  Non-Invasive Evaluation(s):  Exercise MPI(07/19/16): High risk studywith large in size, severe anterior/anteroseptal reversible defect and 2 mm of ST depression in inferolateral leads. LVEF 42%.   Recent CV Pertinent Labs: Lab Results  Component Value Date   CHOL 141 09/17/2017   HDL 36 (L) 09/17/2017   LDLCALC 83 09/17/2017   TRIG 109 09/17/2017   CHOLHDL 3.9 09/17/2017   CHOLHDL 4.6 07/20/2016   INR 1.42 07/26/2016   K 4.4 02/28/2017   MG 2.0 07/27/2016   BUN 30 (H) 02/28/2017   CREATININE 0.89 02/28/2017    Past medical and surgical history were reviewed and updated in EPIC.  Current Meds  Medication Sig  . acetaminophen (TYLENOL) 500 MG tablet Take 2 tablets (1,000 mg total) by mouth every 6 (six) hours as needed.  Marland Kitchen aspirin 81 MG chewable tablet Chew 1 tablet (81 mg total) by mouth daily.  Marland Kitchen glimepiride (AMARYL) 2 MG tablet Take 1 tablet (2 mg total) by mouth daily with breakfast.  . insulin glargine (LANTUS) 100 UNIT/ML injection Inject 0.25 mLs (25 Units total) into the skin 2 (two) times daily.  Marland Kitchen lisinopril (PRINIVIL,ZESTRIL) 10 MG tablet Take 1 tablet (10 mg total) by mouth daily.  . metFORMIN (GLUCOPHAGE) 1000 MG tablet Take 1 tablet (1,000 mg total) by mouth 2 (two) times daily with a meal.  . metoprolol tartrate (LOPRESSOR) 25 MG tablet TAKE 1 TABLET BY MOUTH TWICE DAILY  . nitroGLYCERIN (NITROSTAT) 0.4 MG SL tablet Place 0.4 mg under the tongue every 5 (five) minutes as needed for chest pain.  Marland Kitchen rOPINIRole (REQUIP) 1 MG tablet Take 1 tablet (1 mg total) by mouth 3 (three) times daily.  . rosuvastatin (CRESTOR) 20 MG tablet Take 1 tablet (20 mg total) by  mouth daily.  . sertraline (ZOLOFT) 100 MG tablet Take 1 tablet (100 mg total) by mouth daily.  . sildenafil (REVATIO) 20 MG tablet Take 20 mg by mouth. 3-5 as needed  . [DISCONTINUED] clopidogrel (PLAVIX) 75 MG tablet TAKE 1 TABLET BY MOUTH ONCE DAILY     Allergies: Simvastatin  Social History   Tobacco Use  . Smoking status: Never Smoker  . Smokeless tobacco: Never Used  Substance Use Topics  . Alcohol use: No  . Drug use: No    Family History  Problem Relation Age of Onset  . Heart failure Mother   . Diabetes Mother   . CAD Paternal Grandfather   . Heart attack Paternal Grandfather   . Heart attack Paternal Uncle   . Cancer Neg Hx     Review of Systems: A 12-system review of systems was performed and was negative except as noted in the HPI.  --------------------------------------------------------------------------------------------------  Physical Exam: BP 110/72   Pulse 79   Ht 5\' 9"  (1.753 m)   Wt 188 lb 9.6 oz (85.5 kg)   SpO2 96%   BMI 27.85 kg/m   General:  NAD HEENT: No conjunctival pallor or scleral icterus. Moist mucous membranes.  OP clear. Neck: Supple without lymphadenopathy, thyromegaly, JVD, or HJR. Lungs: Normal work of breathing. Clear to auscultation bilaterally without wheezes or crackles. Heart: Regular rate and rhythm without murmurs, rubs, or gallops. Non-displaced PMI. Abd: Bowel sounds present. Soft, NT/ND without hepatosplenomegaly Ext: No lower extremity edema. Radial, PT, and DP pulses are 2+ bilaterally. Skin: Warm and dry without rash.  EKG:  NSR without abnormalities.  Lab Results  Component Value Date   WBC 7.1 11/08/2016   HGB 14.0 11/08/2016   HCT 42.5 11/08/2016   MCV 93.7 11/08/2016   PLT 268.0 11/08/2016    Lab Results  Component Value Date   NA 139 02/28/2017   K 4.4 02/28/2017   CL 102 02/28/2017   CO2 21 02/28/2017   BUN 30 (H) 02/28/2017   CREATININE 0.89 02/28/2017   GLUCOSE 152 (H) 02/28/2017   ALT 48 (H) 09/17/2017    Lab Results  Component Value Date   CHOL 141 09/17/2017   HDL 36 (L) 09/17/2017   LDLCALC 83 09/17/2017   TRIG 109 09/17/2017   CHOLHDL 3.9 09/17/2017     --------------------------------------------------------------------------------------------------  ASSESSMENT AND PLAN: CAD without angina [name] [name] has recovered well following CABG last year for NSTEMI.  We will discontinue clopidogrel at this point and continue indefinite low-dose aspirin as well as aggressive lipid therapy, as below.  Ischemic cardiomyopathy [name] [name] appears euvolemic with NYHA class 1 symptoms.  Continue lisinopril and metoprolol tartrate.  Hyperlipidemia [name] [name] is tolerating rosuvastatin 20 mg daily well.   Most recent labs showed LDL slightly above goal but improved ALT that is now just minimally elevated.   We have agreed to continue his current dose of rosuvastatin and work on lifestyle modifications.  We will repeat a fasting lipid panel and ALT in ~3 months.  Hypertension BP well-controlled.  No medication changes at this time.  Diabetes mellitus Suboptimally controlled with most recent A1c of 8.6.  I have recommended that [name] [name] speak with Dr. Alphonsus SiasLetvak about further medication adjustments to better control his DM.  Empagliflozin may be a good option, given its beneficial cardiac profile.  Follow-up: Return to clinic in 6 months in [place] office.  Yvonne Kendallhristopher Kellee Sittner, MD 09/20/2017 11:22 PM

## 2017-09-20 ENCOUNTER — Encounter: Payer: Self-pay | Admitting: Internal Medicine

## 2017-09-20 ENCOUNTER — Ambulatory Visit (INDEPENDENT_AMBULATORY_CARE_PROVIDER_SITE_OTHER): Payer: 59 | Admitting: Internal Medicine

## 2017-09-20 VITALS — BP 108/80 | HR 76 | Temp 98.1°F | Ht 69.0 in | Wt 188.0 lb

## 2017-09-20 DIAGNOSIS — G2581 Restless legs syndrome: Secondary | ICD-10-CM

## 2017-09-20 DIAGNOSIS — E1159 Type 2 diabetes mellitus with other circulatory complications: Secondary | ICD-10-CM

## 2017-09-20 DIAGNOSIS — Z794 Long term (current) use of insulin: Secondary | ICD-10-CM

## 2017-09-20 LAB — POCT GLYCOSYLATED HEMOGLOBIN (HGB A1C): Hemoglobin A1C: 8 % — AB (ref 4.0–5.6)

## 2017-09-20 MED ORDER — CLONAZEPAM 0.5 MG PO TABS: 0.5000 mg | ORAL_TABLET | Freq: Every day | ORAL | 0 refills | Status: DC

## 2017-09-20 NOTE — Patient Instructions (Signed)
Please add the clonazepam at bedtime. Try 1 tab and increase to 2 if needed (after a few days). If you are sleeping well, you can try to wean off the ropinirole.

## 2017-09-20 NOTE — Assessment & Plan Note (Signed)
Lab Results  Component Value Date   HGBA1C 8.0 (A) 09/20/2017   Is better now Can hold off on other medications Discussed lifestyle ----home earlier and exercise, etc

## 2017-09-20 NOTE — Assessment & Plan Note (Signed)
Not controlled with ropinirole Will try adding clonazepam Consider trying sinemet if that doesn't help

## 2017-09-20 NOTE — Progress Notes (Signed)
Subjective:    Patient ID: [name] [name], male    DOB: 09-10-1970, 47 y.o.   MRN: [id number]  HPI Here for follow up of uncontrolled diabetes  Has been trying to be more careful Work schedule has been tough--- not getting home till 7PM at times Has major meal then Up till 10-10:30PM  RLS is worse benedryl makes him worse Some daytime issues---will occasionally have to stop truck and walk around a bit Did increase ropinirole to 3mg --but it caused upset stomach  Current Outpatient Medications on File Prior to Visit  Medication Sig Dispense Refill  . acetaminophen (TYLENOL) 500 MG tablet Take 2 tablets (1,000 mg total) by mouth every 6 (six) hours as needed. 30 tablet 0  . aspirin 81 MG chewable tablet Chew 1 tablet (81 mg total) by mouth daily. 30 tablet 0  . glimepiride (AMARYL) 2 MG tablet Take 1 tablet (2 mg total) by mouth daily with breakfast. 90 tablet 3  . insulin glargine (LANTUS) 100 UNIT/ML injection Inject 0.25 mLs (25 Units total) into the skin 2 (two) times daily. 10 mL 12  . lisinopril (PRINIVIL,ZESTRIL) 10 MG tablet Take 1 tablet (10 mg total) by mouth daily. 90 tablet 2  . metFORMIN (GLUCOPHAGE) 1000 MG tablet Take 1 tablet (1,000 mg total) by mouth 2 (two) times daily with a meal. 180 tablet 3  . metoprolol tartrate (LOPRESSOR) 25 MG tablet TAKE 1 TABLET BY MOUTH TWICE DAILY 180 tablet 3  . nitroGLYCERIN (NITROSTAT) 0.4 MG SL tablet Place 0.4 mg under the tongue every 5 (five) minutes as needed for chest pain.    Marland Kitchen rOPINIRole (REQUIP) 1 MG tablet Take 1 tablet (1 mg total) by mouth 3 (three) times daily. 270 tablet 3  . rosuvastatin (CRESTOR) 20 MG tablet Take 1 tablet (20 mg total) by mouth daily. 90 tablet 3  . sertraline (ZOLOFT) 100 MG tablet Take 1 tablet (100 mg total) by mouth daily. 90 tablet 3  . sildenafil (REVATIO) 20 MG tablet Take 20 mg by mouth. 3-5 as needed     No current facility-administered medications on file prior to visit.     Allergies    Allergen Reactions  . Simvastatin Other (See Comments)    Elevated LFTs?    Past Medical History:  Diagnosis Date  . Anxiety   . Coronary artery calcification    a. noted on CT angio 07/16/16  . Coronary artery disease   . High cholesterol   . HTN (hypertension)   . OSA on CPAP    "don't wear it that much" (07/19/2016)  . 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 Type II diabetes mellitus (HCC)     Past Surgical History:  Procedure Laterality Date  . CARDIAC CATHETERIZATION  07/19/2016  . CORONARY ARTERY BYPASS GRAFT N/A 07/26/2016   Procedure: CORONARY ARTERY BYPASS GRAFTING (CABG), ON PUMP, TIMES FIVE, USING BILATERAL INTERNAL MAMMARY ARTERIES AND ENDOSCOPICALLY HARVESTED RIGHT GREATER SAPHENOUS VEIN;  Surgeon: Purcell Nails, MD;  Location: [facility] OR;  Service: Open Heart Surgery;  Laterality: N/A;  LIMA to LAD RIMA to Acute Marginal SVG to PDA SVG to Diag 2 SVG to OM1  . LEFT HEART CATH AND CORONARY ANGIOGRAPHY N/A 07/19/2016   Procedure: Left Heart Cath and Coronary Angiography;  Surgeon: Corky Crafts, MD;  Location: [facility] INVASIVE CV LAB;  Service: Cardiovascular;  Laterality:  N/A;  . ORCHIOPEXY Right ~ 1980   "undescending testicle"  . TEE WITHOUT CARDIOVERSION N/A 07/26/2016   Procedure: TRANSESOPHAGEAL ECHOCARDIOGRAM (TEE);  Surgeon: Purcell Nailswen, Clarence H, MD;  Location[facility];  Service: Open Heart Surgery;  Laterality: N/A;    Family History  Problem Relation Age of Onset  . Heart failure Mother   . Diabetes Mother   . CAD Paternal Grandfather   . Heart attack Paternal Grandfather   . Heart attack Paternal Uncle   . Cancer Neg Hx     Social History   Socioeconomic History  . Marital status: Married    Spouse name: Not on file  . Number of children: 0  . Years of education: Not on file  . Highest education level: Not on file  Occupational History  . Occupation: Pensions consultant[employer]     Employer: [employer]  Social Needs  . Financial resource strain: Not on file  . Food insecurity:    Worry: Not on file    Inability: Not on file  . Transportation needs:    Medical: Not on file    Non-medical: Not on file  Tobacco Use  . Smoking status: Never Smoker  . Smokeless tobacco: Never Used  Substance and Sexual Activity  . Alcohol use: No  . Drug use: No  . Sexual activity: Yes  Lifestyle  . Physical activity:    Days per week: Not on file    Minutes per session: Not on file  . Stress: Not on file  Relationships  . Social connections:    Talks on phone: Not on file    Gets together: Not on file    Attends religious service: Not on file    Active member of club or organization: Not on file    Attends meetings of clubs or organizations: Not on file    Relationship status: Not on file  . Intimate partner violence:    Fear of current or ex partner: Not on file    Emotionally abused: Not on file    Physically abused: Not on file    Forced sexual activity: Not on file  Other Topics Concern  . Not on file  Social History Narrative   Divorced then remarried 2015   2 stepchildren from prior marriage   2 stepsons from this marriage   Review of Systems Has lost 6# since my last visit No angina or SOB--saw cardiologist yesterday    Objective:   Physical Exam  Constitutional: He appears well-developed. No distress.  Psychiatric: He has a normal mood and affect. His behavior is normal.           Assessment & Plan:

## 2017-09-24 NOTE — Addendum Note (Signed)
Addended by: Carlisle CaterNKWANTABISA, Javonne Louissaint C on: 09/24/2017 01:40 PM   Modules accepted: Orders

## 2017-10-02 ENCOUNTER — Other Ambulatory Visit: Payer: Self-pay

## 2017-10-02 DIAGNOSIS — I2510 Atherosclerotic heart disease of native coronary artery without angina pectoris: Secondary | ICD-10-CM

## 2017-10-21 ENCOUNTER — Other Ambulatory Visit: Payer: Self-pay | Admitting: Internal Medicine

## 2017-10-21 NOTE — Telephone Encounter (Signed)
Last filled 09-20-17 #60 Last OV 09-20-17 Next OV 12-31-17

## 2017-11-17 ENCOUNTER — Other Ambulatory Visit: Payer: Self-pay | Admitting: Internal Medicine

## 2017-11-18 NOTE — Telephone Encounter (Signed)
Last filled 10-21-17 #60 Last OV 09-20-17 Next OV 12-31-17

## 2017-12-06 ENCOUNTER — Encounter: Payer: 59 | Admitting: Internal Medicine

## 2017-12-20 ENCOUNTER — Other Ambulatory Visit: Payer: 59

## 2017-12-27 ENCOUNTER — Other Ambulatory Visit: Payer: 59

## 2017-12-29 ENCOUNTER — Telehealth: Payer: Self-pay | Admitting: Internal Medicine

## 2017-12-30 NOTE — Telephone Encounter (Signed)
Have him reschedule within the next 1-2 months

## 2017-12-30 NOTE — Telephone Encounter (Signed)
Last filled 11-20-17 #60 Last OV 09-20-17 No Future OV (Pt cancelled appt tomorrow and has not rescheduled)

## 2017-12-31 ENCOUNTER — Encounter: Payer: 59 | Admitting: Internal Medicine

## 2018-01-01 NOTE — Telephone Encounter (Signed)
I spoke to patient and he scheduled appointment on 02/14/18 at 12:00.

## 2018-02-01 ENCOUNTER — Other Ambulatory Visit: Payer: Self-pay | Admitting: Internal Medicine

## 2018-02-03 NOTE — Telephone Encounter (Signed)
Last filled 12-30-17 #60 Last OV 09-20-17 Next OV 02-14-18 [facility] [address]

## 2018-02-14 ENCOUNTER — Encounter: Payer: 59 | Admitting: Internal Medicine

## 2018-03-08 ENCOUNTER — Other Ambulatory Visit: Payer: Self-pay | Admitting: Internal Medicine

## 2018-03-14 MED ORDER — CLONAZEPAM 0.5 MG PO TABS: 0.5000 mg | ORAL_TABLET | Freq: Every day | ORAL | 0 refills | Status: DC

## 2018-03-14 NOTE — Addendum Note (Signed)
Addended by: Eual Fines on: 03/14/2018 12:56 PM   Modules accepted: Orders

## 2018-03-14 NOTE — Telephone Encounter (Signed)
[facility] who is wanting to know why pts medication was denied. His last Rx was 02/03/2018. pls advise

## 2018-03-14 NOTE — Telephone Encounter (Signed)
Im not sure. There was a jump in the screen a few days ago when I was doing rxs. His may have been one it jumped from. I never saw the name.

## 2018-03-14 NOTE — Addendum Note (Signed)
Addended by: Tillman Abide I on: 03/14/2018 02:06 PM   Modules accepted: Orders

## 2018-03-24 ENCOUNTER — Encounter: Payer: Self-pay | Admitting: Internal Medicine

## 2018-03-24 ENCOUNTER — Ambulatory Visit: Payer: 59 | Admitting: Internal Medicine

## 2018-03-24 VITALS — BP 138/66 | HR 75 | Ht 69.0 in | Wt 189.0 lb

## 2018-03-24 DIAGNOSIS — E785 Hyperlipidemia, unspecified: Secondary | ICD-10-CM

## 2018-03-24 DIAGNOSIS — I10 Essential (primary) hypertension: Secondary | ICD-10-CM

## 2018-03-24 NOTE — Progress Notes (Signed)
Follow-up Outpatient Visit Date: 03/24/2018  Primary Care Provider: Karie Schwalbe, MD [address] [place] [address]  Chief Complaint: Follow-up coronary artery disease and hyperlipidemia  HPI:  Mr. Pound is a 48 y.o. year-old male with history of coronary artery disease status post CABG (07/2016), hypertension, type 2 diabetes mellitus, and anxiety, who presents for follow-up of coronary artery disease and hyperlipidemia.  I last saw him in July, at which time he was doing very well.  He was tolerating rosuvastatin 20 mg daily without side effects; LDL was still slightly above goal at 83.  Today, Mr. Tilley reports feeling well.  He recently had an upper respiratory tract infection but has recovered from this.  He denies chest pain, shortness of breath, palpitations, lightheadedness, and edema.  He is exercising regularly and is planning to begin a weight loss program with his wife.  He has not had any side effects from his medications.  He has not had a lipid panel and ALT repeated since our last visit.  He remains on rosuvastatin 20 mg daily.  --------------------------------------------------------------------------------------------------  Cardiovascular History & Procedures: Cardiovascular Problems:  Coronary artery disease status post CABG for unstable angina (07/2016)  Risk Factors:  Known coronary artery disease, hypertension, diabetes mellitus, and male gender  Cath/PCI:  LHC (07/19/16): LMCA normal. LAD 90% proximal and 80% mid stenoses. D1 and D2 both with 90% ostial/proximal lesions. Dominant LCx. OM1 with 80% proximal stenosis. lPDA proximally occluded, with distal vessel filling via left-to-left and right-to-left collaterals. Small, non-dominant RCA with 80% mid/distal stenosis. LVEF 55-65%. LVEDP 7 mmHg.  CV Surgery:  CABG (07/26/16, Dr. Cornelius Moras): LIMA->LAD, RIMA->acute marginal of RCA, SVG->lPDA, SVG->OM1, and SVG->D2.  EP Procedures and  Devices:  None  Non-Invasive Evaluation(s):  Exercise MPI(07/19/16): High risk studywith large in size, severe anterior/anteroseptal reversible defect and 2 mm of ST depression in inferolateral leads. LVEF 42%.  Recent CV Pertinent Labs: Lab Results  Component Value Date   CHOL 141 09/17/2017   HDL 36 (L) 09/17/2017   LDLCALC 83 09/17/2017   TRIG 109 09/17/2017   CHOLHDL 3.9 09/17/2017   CHOLHDL 4.6 07/20/2016   INR 1.42 07/26/2016   K 4.4 02/28/2017   MG 2.0 07/27/2016   BUN 30 (H) 02/28/2017   CREATININE 0.89 02/28/2017    Past medical and surgical history were reviewed and updated in EPIC.  Current Meds  Medication Sig  . acetaminophen (TYLENOL) 500 MG tablet Take 2 tablets (1,000 mg total) by mouth every 6 (six) hours as needed.  Marland Kitchen aspirin 81 MG chewable tablet Chew 1 tablet (81 mg total) by mouth daily.  Marland Kitchen glimepiride (AMARYL) 2 MG tablet TAKE 1 TABLET BY MOUTH ONCE DAILY WITH BREAKFAST  . insulin glargine (LANTUS) 100 UNIT/ML injection Inject 0.25 mLs (25 Units total) into the skin 2 (two) times daily.  Marland Kitchen lisinopril (PRINIVIL,ZESTRIL) 10 MG tablet Take 1 tablet (10 mg total) by mouth daily.  . metFORMIN (GLUCOPHAGE) 1000 MG tablet TAKE 1 TABLET BY MOUTH TWICE DAILY WITH A MEAL  . metoprolol tartrate (LOPRESSOR) 25 MG tablet TAKE 1 TABLET BY MOUTH TWICE DAILY  . nitroGLYCERIN (NITROSTAT) 0.4 MG SL tablet Place 0.4 mg under the tongue every 5 (five) minutes as needed for chest pain.  Marland Kitchen rOPINIRole (REQUIP) 1 MG tablet Take 1 tablet (1 mg total) by mouth 3 (three) times daily.  . rosuvastatin (CRESTOR) 20 MG tablet Take 1 tablet (20 mg total) by mouth daily.  . sertraline (ZOLOFT) 100 MG  tablet Take 1 tablet (100 mg total) by mouth daily.  . sildenafil (REVATIO) 20 MG tablet Take 20 mg by mouth. 3-5 as needed    Allergies: Simvastatin  Social History   Tobacco Use  . Smoking status: Never Smoker  . Smokeless tobacco: Never Used  Substance Use Topics  . Alcohol  use: No  . Drug use: No    Family History  Problem Relation Age of Onset  . Heart failure Mother   . Diabetes Mother   . CAD Paternal Grandfather   . Heart attack Paternal Grandfather   . Heart attack Paternal Uncle   . Cancer Neg Hx     Review of Systems: A 12-system review of systems was performed and was negative except as noted in the HPI.  --------------------------------------------------------------------------------------------------  Physical Exam: BP 138/66 (BP Location: Left Arm, Patient Position: Sitting, Cuff Size: Normal)   Pulse 75   Ht 5\' 9"  (1.753 m)   Wt 189 lb (85.7 kg)   BMI 27.91 kg/m   General: NAD. HEENT: No conjunctival pallor or scleral icterus. Moist mucous membranes.  OP clear. Neck: Supple without lymphadenopathy, thyromegaly, JVD, or HJR. Lungs: Normal work of breathing. Clear to auscultation bilaterally without wheezes or crackles. Heart: Regular rate and rhythm without murmurs, rubs, or gallops. Non-displaced PMI. Abd: Bowel sounds present. Soft, NT/ND without hepatosplenomegaly Ext: No lower extremity edema. Skin: Warm and dry without rash.  EKG: Normal sinus rhythm without abnormality.  Lab Results  Component Value Date   WBC 7.1 11/08/2016   HGB 14.0 11/08/2016   HCT 42.5 11/08/2016   MCV 93.7 11/08/2016   PLT 268.0 11/08/2016    Lab Results  Component Value Date   NA 139 02/28/2017   K 4.4 02/28/2017   CL 102 02/28/2017   CO2 21 02/28/2017   BUN 30 (H) 02/28/2017   CREATININE 0.89 02/28/2017   GLUCOSE 152 (H) 02/28/2017   ALT 48 (H) 09/17/2017    Lab Results  Component Value Date   CHOL 141 09/17/2017   HDL 36 (L) 09/17/2017   LDLCALC 83 09/17/2017   TRIG 109 09/17/2017   CHOLHDL 3.9 09/17/2017    --------------------------------------------------------------------------------------------------  ASSESSMENT AND PLAN: Coronary artery disease No symptoms to suggest worsening coronary insufficiency.  Continue  aggressive secondary prevention status post CABG.  Ischemic cardiomyopathy No symptoms of heart failure, consistent with NYHA class I.  Continue lisinopril and metoprolol at current doses.  Hyperlipidemia LDL suboptimally controlled on last check.  We have been reluctant to escalate rosuvastatin further in the setting of mild transaminitis.  I have encouraged Mr. Morena to present to the medical mall at his convenience for a fasting lipid panel and ALT.  If LDL remains greater than 70 and LFTs allow, I would like to escalate rosuvastatin to 40 mg daily.  Alternatively, we would need to consider a PCSK9 inhibitor, which Mr. Havard is in agreement with.  Hypertension Blood pressure mildly elevated today but typically normal.  I have encouraged Mr. Propson to monitor his blood pressure at home and to contact us or Dr. Alphonsus Sias if it is consistently above 130/80.  Follow-up: Return to clinic in 6 months.  Yvonne Kendall, MD 03/24/2018 4:28 PM

## 2018-03-24 NOTE — Patient Instructions (Signed)
Medication Instructions:  Your physician recommends that you continue on your current medications as directed. Please refer to the Current Medication list given to you today.  If you need a refill on your cardiac medications before your next appointment, please call your pharmacy.   Lab work: Your physician recommends that you return for lab work AT [facility] (LIPID, CMET). - You will need to be FASTING. - Please go to the [facility]. You will check in at the front desk to the right as you walk into the atrium. Valet Parking is offered if needed.   If you have labs (blood work) drawn today and your tests are completely normal, you will receive your results only by: Marland Kitchen MyChart Message (if you have MyChart) OR . A paper copy in the mail If you have any lab test that is abnormal or we need to change your treatment, we will call you to review the results.  Testing/Procedures: NONE  Follow-Up: At [facility], you and your health needs are our priority.  As part of our continuing mission to provide you with exceptional heart care, we have created designated Provider Care Teams.  These Care Teams include your primary Cardiologist (physician) and Advanced Practice Providers (APPs -  Physician Assistants and Nurse Practitioners) who all work together to provide you with the care you need, when you need it. You will need a follow up appointment in 6 months.  Please call our office 2 months in advance to schedule this appointment.  You may see Yvonne Kendall, MD or one of the following Advanced Practice Providers on your designated Care Team:   Nicolasa Ducking, NP Eula Listen, PA-C . Marisue Ivan, PA-C

## 2018-03-25 ENCOUNTER — Encounter: Payer: Self-pay | Admitting: Internal Medicine

## 2018-04-16 ENCOUNTER — Other Ambulatory Visit: Payer: Self-pay | Admitting: Internal Medicine

## 2018-05-14 ENCOUNTER — Encounter: Payer: Self-pay | Admitting: Physician Assistant

## 2018-05-14 ENCOUNTER — Ambulatory Visit (INDEPENDENT_AMBULATORY_CARE_PROVIDER_SITE_OTHER): Payer: 59 | Admitting: Physician Assistant

## 2018-05-14 VITALS — BP 120/80 | HR 81 | Temp 99.8°F | Ht 69.0 in | Wt 189.5 lb

## 2018-05-14 DIAGNOSIS — R6889 Other general symptoms and signs: Secondary | ICD-10-CM

## 2018-05-14 LAB — POC INFLUENZA A&B (BINAX/QUICKVUE)
Influenza A, POC: NEGATIVE
Influenza B, POC: NEGATIVE

## 2018-05-14 NOTE — Patient Instructions (Signed)
It was great to see you!  You have a viral upper respiratory infection. Antibiotics are not needed for this.  Viral infections usually take 7-10 days to resolve.  The cough can last a few weeks to go away.  Push fluids and get plenty of rest. Please return if you are not improving as expected, or if you have high fevers (>101.5) or difficulty swallowing or worsening productive cough.  Call clinic with questions.  Upper respiratory infection recommendations for those with current or history of elevated blood pressure: 1. Avoid all over-the-counter antihistamines except Claritin/Loratadine and Zyrtec/Cetrizine. 2. Avoid all combination including cold sinus allergies flu decongestant and sleep medications 3. You can use Robitussin DM Mucinex and Mucinex DM for cough.

## 2018-05-14 NOTE — Progress Notes (Signed)
[name] [name] is a 48 y.o. male here for a new problem.  I acted as a Neurosurgeon for [employer], PA-C Corky Mull, LPN  History of Present Illness:   Chief Complaint  Patient presents with  . Cough    x5 days  . Fever    100.2  last night    Cough  This is a new problem. Episode onset: Started 5 days ago. The problem has been gradually improving. The problem occurs every few hours. The cough is non-productive. Associated symptoms include chills, a fever and nasal congestion. Pertinent negatives include no ear congestion, ear pain, headaches, sore throat, shortness of breath or sweats. Associated symptoms comments: Body aches. Risk factors for lung disease include occupational exposure. Treatments tried: Ibuprofen, Nyquil & Dayquil. The treatment provided moderate relief. There is no history of asthma, bronchitis or pneumonia.   He feels like he is improved today more than the rest of the week. He would like to be tested today for flu even though he is out of the treatment window.  Past Medical History:  Diagnosis Date  . Anxiety   . Coronary artery calcification    a. noted on CT angio 07/16/16  . Coronary artery disease   . High cholesterol   . HTN (hypertension)   . OSA on CPAP    "don't wear it that much" (07/19/2016)  . 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 Type II diabetes mellitus (HCC)      Social History   Socioeconomic History  . Marital status: Married    Spouse name: Not on file  . Number of children: 0  . Years of education: Not on file  . Highest education level: Not on file  Occupational History  . 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 Financial resource strain: Not on file  . Food insecurity:    Worry: Not on file    Inability: Not on file  . Transportation needs:    Medical: Not on file    Non-medical: Not on file  Tobacco Use  . Smoking  status: Never Smoker  . Smokeless tobacco: Never Used  Substance and Sexual Activity  . Alcohol use: No  . Drug use: No  . Sexual activity: Yes  Lifestyle  . Physical activity:    Days per week: Not on file    Minutes per session: Not on file  . Stress: Not on file  Relationships  . Social connections:    Talks on phone: Not on file    Gets together: Not on file    Attends religious service: Not on file    Active member of club or organization: Not on file    Attends meetings of clubs or organizations: Not on file    Relationship status: Not on file  . Intimate partner violence:    Fear of current or ex partner: Not on file    Emotionally abused: Not on file    Physically abused: Not on file    Forced sexual activity: Not on file  Other Topics Concern  . Not on file  Social History Narrative   Divorced then remarried 2015   2 stepchildren from prior marriage   2 stepsons from this marriage    Past Surgical History:  Procedure Laterality Date  . CARDIAC CATHETERIZATION  07/19/2016  .  CORONARY ARTERY BYPASS GRAFT N/A 07/26/2016   Procedure: CORONARY ARTERY BYPASS GRAFTING (CABG), ON PUMP, TIMES FIVE, USING BILATERAL INTERNAL MAMMARY ARTERIES AND ENDOSCOPICALLY HARVESTED RIGHT GREATER SAPHENOUS VEIN;  Surgeon: Purcell Nails, MD;  Location: [facility];  Service: Open Heart Surgery;  Laterality: N/A;  LIMA to LAD RIMA to Acute Marginal SVG to PDA SVG to Diag 2 SVG to OM1  . LEFT HEART CATH AND CORONARY ANGIOGRAPHY N/A 07/19/2016   Procedure: Left Heart Cath and Coronary Angiography;  Surgeon: Corky Crafts, MD;  Location: [facility] INVASIVE CV LAB;  Service: Cardiovascular;  Laterality: N/A;  . ORCHIOPEXY Right ~ 1980   "undescending testicle"  . TEE WITHOUT CARDIOVERSION N/A 07/26/2016   Procedure: TRANSESOPHAGEAL ECHOCARDIOGRAM (TEE);  Surgeon: Purcell Nails, MD;  Location: [facility] OR;  Service: Open Heart Surgery;  Laterality: N/A;    Family History  Problem Relation Age of  Onset  . Heart failure Mother   . Diabetes Mother   . CAD Paternal Grandfather   . Heart attack Paternal Grandfather   . Heart attack Paternal Uncle   . Cancer Neg Hx     Allergies  Allergen Reactions  . Simvastatin Other (See Comments)    Elevated LFTs?    Current Medications:   Current Outpatient Medications:  .  acetaminophen (TYLENOL) 500 MG tablet, Take 2 tablets (1,000 mg total) by mouth every 6 (six) hours as needed., Disp: 30 tablet, Rfl: 0 .  aspirin 81 MG chewable tablet, Chew 1 tablet (81 mg total) by mouth daily., Disp: 30 tablet, Rfl: 0 .  clonazePAM (KLONOPIN) 0.5 MG tablet, Take 1-2 tablets (0.5-1 mg total) by mouth at bedtime., Disp: 60 tablet, Rfl: 0 .  glimepiride (AMARYL) 2 MG tablet, TAKE 1 TABLET BY MOUTH ONCE DAILY WITH BREAKFAST, Disp: 90 tablet, Rfl: 3 .  ibuprofen (ADVIL,MOTRIN) 200 MG tablet, Take 200 mg by mouth every 6 (six) hours as needed., Disp: , Rfl:  .  insulin glargine (LANTUS) 100 UNIT/ML injection, Inject 0.25 mLs (25 Units total) into the skin 2 (two) times daily., Disp: 10 mL, Rfl: 12 .  lisinopril (PRINIVIL,ZESTRIL) 10 MG tablet, Take 1 tablet (10 mg total) by mouth daily., Disp: 90 tablet, Rfl: 2 .  metFORMIN (GLUCOPHAGE) 1000 MG tablet, TAKE 1 TABLET BY MOUTH TWICE DAILY WITH A MEAL, Disp: 180 tablet, Rfl: 3 .  metoprolol tartrate (LOPRESSOR) 25 MG tablet, TAKE 1 TABLET BY MOUTH TWICE DAILY, Disp: 180 tablet, Rfl: 0 .  nitroGLYCERIN (NITROSTAT) 0.4 MG SL tablet, Place 0.4 mg under the tongue every 5 (five) minutes as needed for chest pain., Disp: , Rfl:  .  Pseudoeph-Doxylamine-DM-APAP (DAYQUIL/NYQUIL COLD/FLU RELIEF PO), Take by mouth., Disp: , Rfl:  .  rOPINIRole (REQUIP) 1 MG tablet, Take 1 tablet (1 mg total) by mouth 3 (three) times daily., Disp: 270 tablet, Rfl: 3 .  rosuvastatin (CRESTOR) 20 MG tablet, Take 1 tablet (20 mg total) by mouth daily., Disp: 90 tablet, Rfl: 3 .  sertraline (ZOLOFT) 100 MG tablet, Take 1 tablet (100 mg total)  by mouth daily., Disp: 90 tablet, Rfl: 3 .  sildenafil (REVATIO) 20 MG tablet, Take 20 mg by mouth. 3-5 as needed, Disp: , Rfl:    Review of Systems:   Review of Systems  Constitutional: Positive for chills and fever.  HENT: Negative for ear pain and sore throat.   Respiratory: Positive for cough. Negative for shortness of breath.   Neurological: Negative for headaches.    Vitals:   Vitals:  05/14/18 1114  BP: 120/80  Pulse: 81  Temp: 99.8 F (37.7 C)  TempSrc: Oral  SpO2: 96%  Weight: 189 lb 8 oz (86 kg)  Height:  (1.753 m)     Body mass index is 27.98 kg/m.  Physical Exam:   Physical Exam Vitals signs and nursing note reviewed.  Constitutional:      General: He is not in acute distress.    Appearance: He is well-developed. He is not ill-appearing or toxic-appearing.  HENT:     Head: Normocephalic and atraumatic.     Right Ear: Tympanic membrane, ear canal and external ear normal. Tympanic membrane is not erythematous, retracted or bulging.     Left Ear: Tympanic membrane, ear canal and external ear normal. Tympanic membrane is not erythematous, retracted or bulging.     Nose: Mucosal edema, congestion and rhinorrhea present.     Right Sinus: No maxillary sinus tenderness or frontal sinus tenderness.     Left Sinus: No maxillary sinus tenderness or frontal sinus tenderness.     Mouth/Throat:     Lips: Pink.     Mouth: Mucous membranes are moist.     Pharynx: Uvula midline. Posterior oropharyngeal erythema present.     Tonsils: Swelling: 0 on the right. 0 on the left.  Eyes:     General: Lids are normal.     Conjunctiva/sclera: Conjunctivae normal.  Neck:     Trachea: Trachea normal.  Cardiovascular:     Rate and Rhythm: Normal rate and regular rhythm.     Heart sounds: Normal heart sounds, S1 normal and S2 normal.  Pulmonary:     Effort: Pulmonary effort is normal.     Breath sounds: Normal breath sounds. No decreased breath sounds, wheezing, rhonchi or  rales.  Lymphadenopathy:     Cervical: No cervical adenopathy.  Skin:    General: Skin is warm and dry.  Neurological:     Mental Status: He is alert.  Psychiatric:        Speech: Speech normal.        Behavior: Behavior normal. Behavior is cooperative.     Results for orders placed or performed in visit on 05/14/18  POC Influenza A&B(BINAX/QUICKVUE)  Result Value Ref Range   Influenza A, POC Negative Negative   Influenza B, POC Negative Negative    Assessment and Plan:   Daaiel was seen today for cough and fever.  Diagnoses and all orders for this visit:  Flu-like symptoms -     POC Influenza A&B(BINAX/QUICKVUE)   No red flags on exam.  Flu test negative. Suspect viral URI. Discussed supportive care. Reviewed return precautions including worsening fever, SOB, worsening cough or other concerns. Push fluids and rest. I recommend that patient follow-up if symptoms worsen or persist despite treatment x 7-10 days, sooner if needed.  . Reviewed expectations re: course of current medical issues. . Discussed self-management of symptoms. . Outlined signs and symptoms indicating need for more acute intervention. . Patient verbalized understanding and all questions were answered. . See orders for this visit as documented in the electronic medical record. . Patient received an After-Visit Summary.  CMA or LPN served as scribe during this visit. History, Physical, and Plan performed by medical provider. The above documentation has been reviewed and is accurate and complete.   Jarold Motto, PA-C

## 2018-05-16 ENCOUNTER — Encounter: Payer: Self-pay | Admitting: Internal Medicine

## 2018-05-16 ENCOUNTER — Ambulatory Visit: Payer: 59 | Admitting: Internal Medicine

## 2018-05-16 VITALS — BP 124/84 | HR 85 | Temp 98.7°F | Wt 190.0 lb

## 2018-05-16 DIAGNOSIS — B9789 Other viral agents as the cause of diseases classified elsewhere: Secondary | ICD-10-CM

## 2018-05-16 DIAGNOSIS — J069 Acute upper respiratory infection, unspecified: Secondary | ICD-10-CM

## 2018-05-16 MED ORDER — HYDROCOD POLST-CPM POLST ER 10-8 MG/5ML PO SUER: 5.0000 mL | Freq: Every evening | ORAL | 0 refills | Status: DC | PRN

## 2018-05-16 NOTE — Patient Instructions (Signed)

## 2018-05-16 NOTE — Progress Notes (Signed)
HPI  Pt presents to the clinic today with c/o fever and cough. He reports this started 7 days ago. The fever got as high as 100.0. He denies chills or body aches. The cough is non productive. He denies fever. He saw Jarold Motto, PA for the same 2 days ago. Flu was negative. She advised OTC medications for symptomatic treatment. He has been taking Ibuprofen and Nyquil with some relief. He reports he does feel better, but concerned about intermittent fevers and lingering cough. He does not smoke. He has DM 2 and is UTD on pneumonia vaccines.  Review of Systems      Past Medical History:  Diagnosis Date  . Anxiety   . Coronary artery calcification    a. noted on CT angio 07/16/16  . Coronary artery disease   . High cholesterol   . HTN (hypertension)   . OSA on CPAP    "don't wear it that much" (07/19/2016)  . 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 Type II diabetes mellitus (HCC)     Family History  Problem Relation Age of Onset  . Heart failure Mother   . Diabetes Mother   . CAD Paternal Grandfather   . Heart attack Paternal Grandfather   . Heart attack Paternal Uncle   . Cancer Neg Hx     Social History   Socioeconomic History  . Marital status: Married    Spouse name: Not on file  . Number of children: 0  . Years of education: Not on file  . Highest education level: Not on file  Occupational History  . 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 Financial resource strain: Not on file  . Food insecurity:    Worry: Not on file    Inability: Not on file  . Transportation needs:    Medical: Not on file    Non-medical: Not on file  Tobacco Use  . Smoking status: Never Smoker  . Smokeless tobacco: Never Used  Substance and Sexual Activity  . Alcohol use: No  . Drug use: No  . Sexual activity: Yes  Lifestyle  . Physical activity:    Days per week: Not on file   Minutes per session: Not on file  . Stress: Not on file  Relationships  . Social connections:    Talks on phone: Not on file    Gets together: Not on file    Attends religious service: Not on file    Active member of club or organization: Not on file    Attends meetings of clubs or organizations: Not on file    Relationship status: Not on file  . Intimate partner violence:    Fear of current or ex partner: Not on file    Emotionally abused: Not on file    Physically abused: Not on file    Forced sexual activity: Not on file  Other Topics Concern  . Not on file  Social History Narrative   Divorced then remarried 2015   2 stepchildren from prior marriage   2 stepsons from this marriage    Allergies  Allergen Reactions  . Simvastatin Other (See Comments)    Elevated LFTs?     Constitutional: Positive fever. Denies headache, fatigue, abrupt weight changes.  HEENT:  Denies eye redness, eye pain, pressure behind the eyes, facial  pain, nasal congestion, ear pain, ringing in the ears, wax buildup, runny nose or sore throat. Respiratory: Positive cough. Denies difficulty breathing or shortness of breath.  Cardiovascular: Denies chest pain, chest tightness, palpitations or swelling in the hands or feet.   No other specific complaints in a complete review of systems (except as listed in HPI above).  Objective:   BP 124/84   Pulse 85   Temp 98.7 F (37.1 C) (Oral)   Wt 190 lb (86.2 kg)   SpO2 98%   BMI 28.06 kg/m  Wt Readings from Last 3 Encounters:  05/16/18 190 lb (86.2 kg)  05/14/18 189 lb 8 oz (86 kg)  03/24/18 189 lb (85.7 kg)     General: Appears his stated age,  in NAD. HEENT: Head: normal shape and size; Ears: Tm's gray and intact, normal light reflex; Nose: mucosa pink and moist, septum midline; Throat/Mouth: + PND. Teeth present, mucosa erythematous and moist, no exudate noted, no lesions or ulcerations noted.  Neck: No cervical lymphadenopathy.  Cardiovascular:  Normal rate and rhythm. S1,S2 noted.  No murmur, rubs or gallops noted.  Pulmonary/Chest: Normal effort and positive vesicular breath sounds. No respiratory distress. No wheezes, rales or ronchi noted.       Assessment & Plan:   Viral Upper Respiratory Infection with Cough:  Exam benign Get some rest and drink plenty of water eRx for Tussionex cough syrup  RTC as needed or if symptoms persist.   Nicki Reaper, NP

## 2018-05-30 ENCOUNTER — Other Ambulatory Visit: Payer: Self-pay | Admitting: Internal Medicine

## 2018-05-30 NOTE — Telephone Encounter (Signed)
Patient called and said he'll run out of Lantis.  Patient ran out of medication this morning and he needs to take it tonight.  He thought he had refills left.

## 2018-05-30 NOTE — Telephone Encounter (Signed)
Rx sent electronically.  

## 2018-06-03 ENCOUNTER — Other Ambulatory Visit: Payer: Self-pay | Admitting: Internal Medicine

## 2018-06-09 ENCOUNTER — Other Ambulatory Visit: Payer: Self-pay | Admitting: Internal Medicine

## 2018-06-09 ENCOUNTER — Telehealth: Payer: Self-pay

## 2018-06-09 DIAGNOSIS — E1159 Type 2 diabetes mellitus with other circulatory complications: Secondary | ICD-10-CM

## 2018-06-09 DIAGNOSIS — Z794 Long term (current) use of insulin: Principal | ICD-10-CM

## 2018-06-09 NOTE — Telephone Encounter (Signed)
Order created for a1c

## 2018-06-13 ENCOUNTER — Other Ambulatory Visit: Payer: 59

## 2018-06-16 ENCOUNTER — Ambulatory Visit (INDEPENDENT_AMBULATORY_CARE_PROVIDER_SITE_OTHER): Payer: 59 | Admitting: Internal Medicine

## 2018-06-16 ENCOUNTER — Encounter: Payer: Self-pay | Admitting: Internal Medicine

## 2018-06-16 ENCOUNTER — Other Ambulatory Visit: Payer: Self-pay

## 2018-06-16 VITALS — Wt 184.0 lb

## 2018-06-16 DIAGNOSIS — F419 Anxiety disorder, unspecified: Secondary | ICD-10-CM

## 2018-06-16 DIAGNOSIS — Z794 Long term (current) use of insulin: Secondary | ICD-10-CM

## 2018-06-16 DIAGNOSIS — I2510 Atherosclerotic heart disease of native coronary artery without angina pectoris: Secondary | ICD-10-CM

## 2018-06-16 DIAGNOSIS — E1159 Type 2 diabetes mellitus with other circulatory complications: Secondary | ICD-10-CM | POA: Diagnosis not present

## 2018-06-16 MED ORDER — SERTRALINE HCL 100 MG PO TABS: 100.0000 mg | ORAL_TABLET | Freq: Every day | ORAL | 3 refills | Status: DC

## 2018-06-16 MED ORDER — GLIMEPIRIDE 4 MG PO TABS: 4.0000 mg | ORAL_TABLET | Freq: Every day | ORAL | 3 refills | Status: DC

## 2018-06-16 NOTE — Assessment & Plan Note (Signed)
Has been asymptomatic Trying to walk more and has lost a few pounds

## 2018-06-16 NOTE — Progress Notes (Signed)
Subjective:    Patient ID: [name] [name], male    DOB: 07/13/1970, 48 y.o.   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 acetaminophen (TYLENOL) 500 MG tablet Take 2 tablets (1,000 mg total) by mouth every 6 (six) hours as needed. 30 tablet 0  . aspirin 81 MG chewable tablet Chew 1 tablet (81 mg total) by mouth daily. 30 tablet 0  . glimepiride (AMARYL) 2 MG tablet TAKE 1 TABLET BY MOUTH ONCE DAILY WITH BREAKFAST 90 tablet 3  . ibuprofen (ADVIL,MOTRIN) 200 MG tablet Take 200 mg by mouth every 6 (six) hours as needed.    Marland Kitchen. LANTUS 100 UNIT/ML injection INJECT 0.25 MLS (25 UNITS TOTAL) INTO THE SKIN TWO TIMES DAILY 10 mL 0  . lisinopril (PRINIVIL,ZESTRIL) 10 MG tablet Take 1 tablet by mouth once daily 90 tablet 1  . metFORMIN (GLUCOPHAGE) 1000 MG tablet TAKE 1 TABLET BY MOUTH TWICE DAILY WITH A MEAL 180 tablet 3  . metoprolol tartrate (LOPRESSOR) 25 MG tablet TAKE 1 TABLET BY MOUTH TWICE DAILY 180 tablet 0  . nitroGLYCERIN (NITROSTAT) 0.4 MG SL tablet Place 0.4 mg under the tongue every 5 (five) minutes as needed for chest pain.    Marland Kitchen. rOPINIRole (REQUIP) 1 MG tablet Take 1 tablet (1 mg total) by mouth 3 (three) times daily. 270 tablet 3  . rosuvastatin (CRESTOR) 20 MG tablet Take 1 tablet (20 mg total) by mouth daily. 90 tablet 3  . sertraline (ZOLOFT) 100 MG tablet  Take 1 tablet (100 mg total) by mouth daily. 90 tablet 3  . sildenafil (REVATIO) 20 MG tablet Take 20 mg by mouth. 3-5 as needed     No current facility-administered medications on file prior to visit.     Allergies  Allergen Reactions  . Simvastatin Other (See Comments)    Elevated LFTs?    Past Medical History:  Diagnosis Date  . Anxiety   . Coronary artery calcification    a. noted on CT angio 07/16/16  . Coronary artery disease   . High cholesterol   . HTN (hypertension)   . OSA on CPAP    "don't wear it that much" (07/19/2016)  . 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 Type II diabetes mellitus (HCC)     Past Surgical History:  Procedure Laterality Date  . CARDIAC CATHETERIZATION  07/19/2016  . CORONARY ARTERY BYPASS GRAFT N/A 07/26/2016   Procedure: CORONARY ARTERY BYPASS GRAFTING (CABG), ON PUMP, TIMES FIVE, USING BILATERAL INTERNAL MAMMARY ARTERIES AND ENDOSCOPICALLY HARVESTED RIGHT GREATER SAPHENOUS VEIN;  Surgeon: Purcell Nailswen, Clarence H, MD;  [facility];  Service: Open Heart Surgery;  Laterality: N/A;  LIMA to LAD RIMA to Acute Marginal SVG to PDA SVG to Diag 2 SVG to OM1  .  LEFT HEART CATH AND CORONARY ANGIOGRAPHY N/A 07/19/2016   Procedure: Left Heart Cath and Coronary Angiography;  Surgeon: Corky Crafts, MD;  Location: [facility] INVASIVE CV LAB;  Service: Cardiovascular;  Laterality: N/A;  . ORCHIOPEXY Right ~ 1980   "undescending testicle"  . TEE WITHOUT CARDIOVERSION N/A 07/26/2016   Procedure: TRANSESOPHAGEAL ECHOCARDIOGRAM (TEE);  Surgeon: Purcell Nails, MD;  Location: [facility] OR;  Service: Open Heart Surgery;  Laterality: N/A;    Family History  Problem Relation Age of Onset  . Heart failure Mother   . Diabetes Mother   . CAD Paternal Grandfather   . Heart attack Paternal Grandfather   . Heart attack Paternal Uncle   . Cancer Neg Hx     Social History   Socioeconomic History   . Marital status: Married    Spouse name: Not on file  . Number of children: 0  . Years of education: Not on file  . Highest education level: Not on file  Occupational History  . 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 Financial resource strain: Not on file  . Food insecurity:    Worry: Not on file    Inability: Not on file  . Transportation needs:    Medical: Not on file    Non-medical: Not on file  Tobacco Use  . Smoking status: Never Smoker  . Smokeless tobacco: Never Used  Substance and Sexual Activity  . Alcohol use: No  . Drug use: No  . Sexual activity: Yes  Lifestyle  . Physical activity:    Days per week: Not on file    Minutes per session: Not on file  . Stress: Not on file  Relationships  . Social connections:    Talks on phone: Not on file    Gets together: Not on file    Attends religious service: Not on file    Active member of club or organization: Not on file    Attends meetings of clubs or organizations: Not on file    Relationship status: Not on file  . Intimate partner violence:    Fear of current or ex partner: Not on file    Emotionally abused: Not on file    Physically abused: Not on file    Forced sexual activity: Not on file  Other Topics Concern  . Not on file  Social History Narrative   Divorced then remarried 2015   2 stepchildren from prior marriage   2 stepsons from this marriage   Review of Systems Has lost some weight Hasn't checked his BP----wife is nurse    Objective:   Physical Exam  Constitutional: He appears well-developed. No distress.  Psychiatric: He has a normal mood and affect. His behavior is normal.           Assessment & Plan:

## 2018-06-16 NOTE — Assessment & Plan Note (Signed)
Lab Results  Component Value Date   HGBA1C 8.0 (A) 09/20/2017   Marginal control and he may have worsened some Discussed keeping up with healthy eating Will increase the glimiperide

## 2018-06-16 NOTE — Assessment & Plan Note (Signed)
Doing well on the sertraline Probably will continue indefinitely

## 2018-06-19 ENCOUNTER — Other Ambulatory Visit: Payer: Self-pay | Admitting: Internal Medicine

## 2018-06-20 ENCOUNTER — Other Ambulatory Visit: Payer: Self-pay | Admitting: Internal Medicine

## 2018-06-20 ENCOUNTER — Ambulatory Visit: Payer: Self-pay | Admitting: *Deleted

## 2018-06-20 NOTE — Telephone Encounter (Signed)
Copied from CRM [id number]. Topic: Quick Communication - Rx Refill/Question >> Jun 20, 2018  8:22 AM Reggie Pile, NT wrote: Medication: LANTUS 100 UNIT/ML injection  Has the patient contacted their pharmacy? Yes patient states he no longer has any of this medication and is in need of some.  Preferred Pharmacy (with phone number or street name):  [facility] [address] [place] - [address] [phone] (Phone) [phone] (Fax)  Agent: Please be advised that RX refills may take up to 3 business days. We ask that you follow-up with your pharmacy.

## 2018-06-20 NOTE — Telephone Encounter (Signed)
Contacted pharmacy; spoke with [facility]; she says that per Phramacist Lanice Schwab, the pt will be granted an emergency refill; will notify pt and route to office for MD signature/approval

## 2018-06-20 NOTE — Telephone Encounter (Signed)
Pt called stating that he did not have refills on lantus insulin; he has contacted his pharmacy on 06/19/[address] [place]; the pt said that he had a virtual visit with Dr Alphonsus Sias on 06/16/2018, but he did not realize that he was out of refills; informed pt that he will get a call back once resolved.

## 2018-06-20 NOTE — Telephone Encounter (Signed)
Notified pt of emergency refill; he verbalized understanding; will route to office.

## 2018-06-20 NOTE — Telephone Encounter (Signed)
  Reason for Disposition . [1] Request for URGENT new prescription or refill of "essential" medication (i.e., likelihood of harm to patient if not taken) AND [2] triager unable to fill per unit policy  Answer Assessment - Initial Assessment Questions 1. SYMPTOMS: "Do you have any symptoms?"     no 2. SEVERITY: If symptoms are present, ask "Are they mild, moderate or severe?" n/a  Protocols used: MEDICATION QUESTION CALL-A-AH

## 2018-06-23 ENCOUNTER — Other Ambulatory Visit: Payer: Self-pay | Admitting: Internal Medicine

## 2018-06-23 NOTE — Telephone Encounter (Signed)
Was pt notified that lantus refill was done?

## 2018-06-23 NOTE — Telephone Encounter (Signed)
Already answered on nurse triage note.

## 2018-06-23 NOTE — Telephone Encounter (Signed)
It look like they told him they sent in a refill. I did his refill this morning.

## 2018-07-18 IMAGING — CR DG CHEST 2V
2 series · 2 of 2 positions shown · non-contrast
Comparison: None.

CLINICAL DATA: Chest pain for 2-3 weeks.

EXAM:
CHEST  2 VIEW

[chest pa]
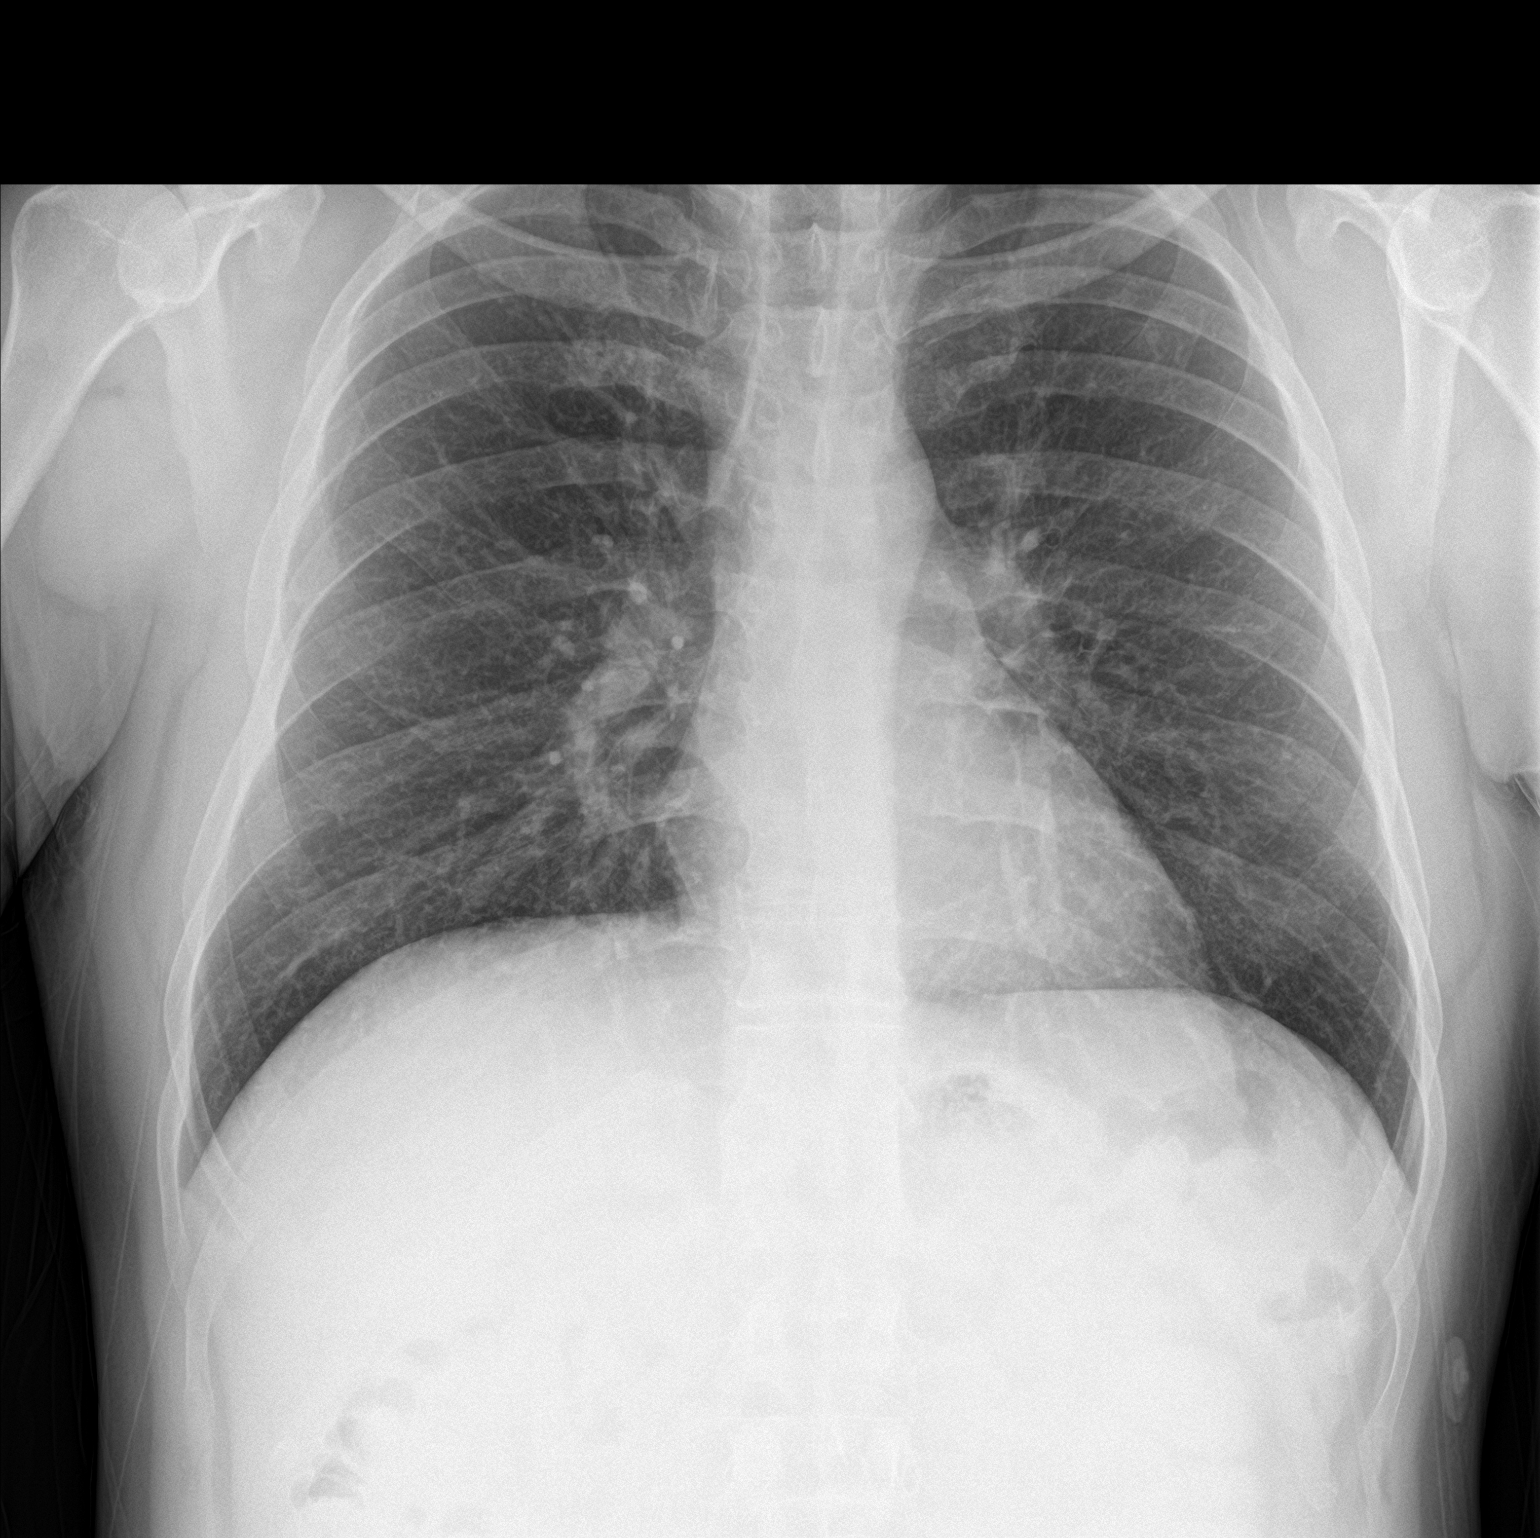

[chest lat]
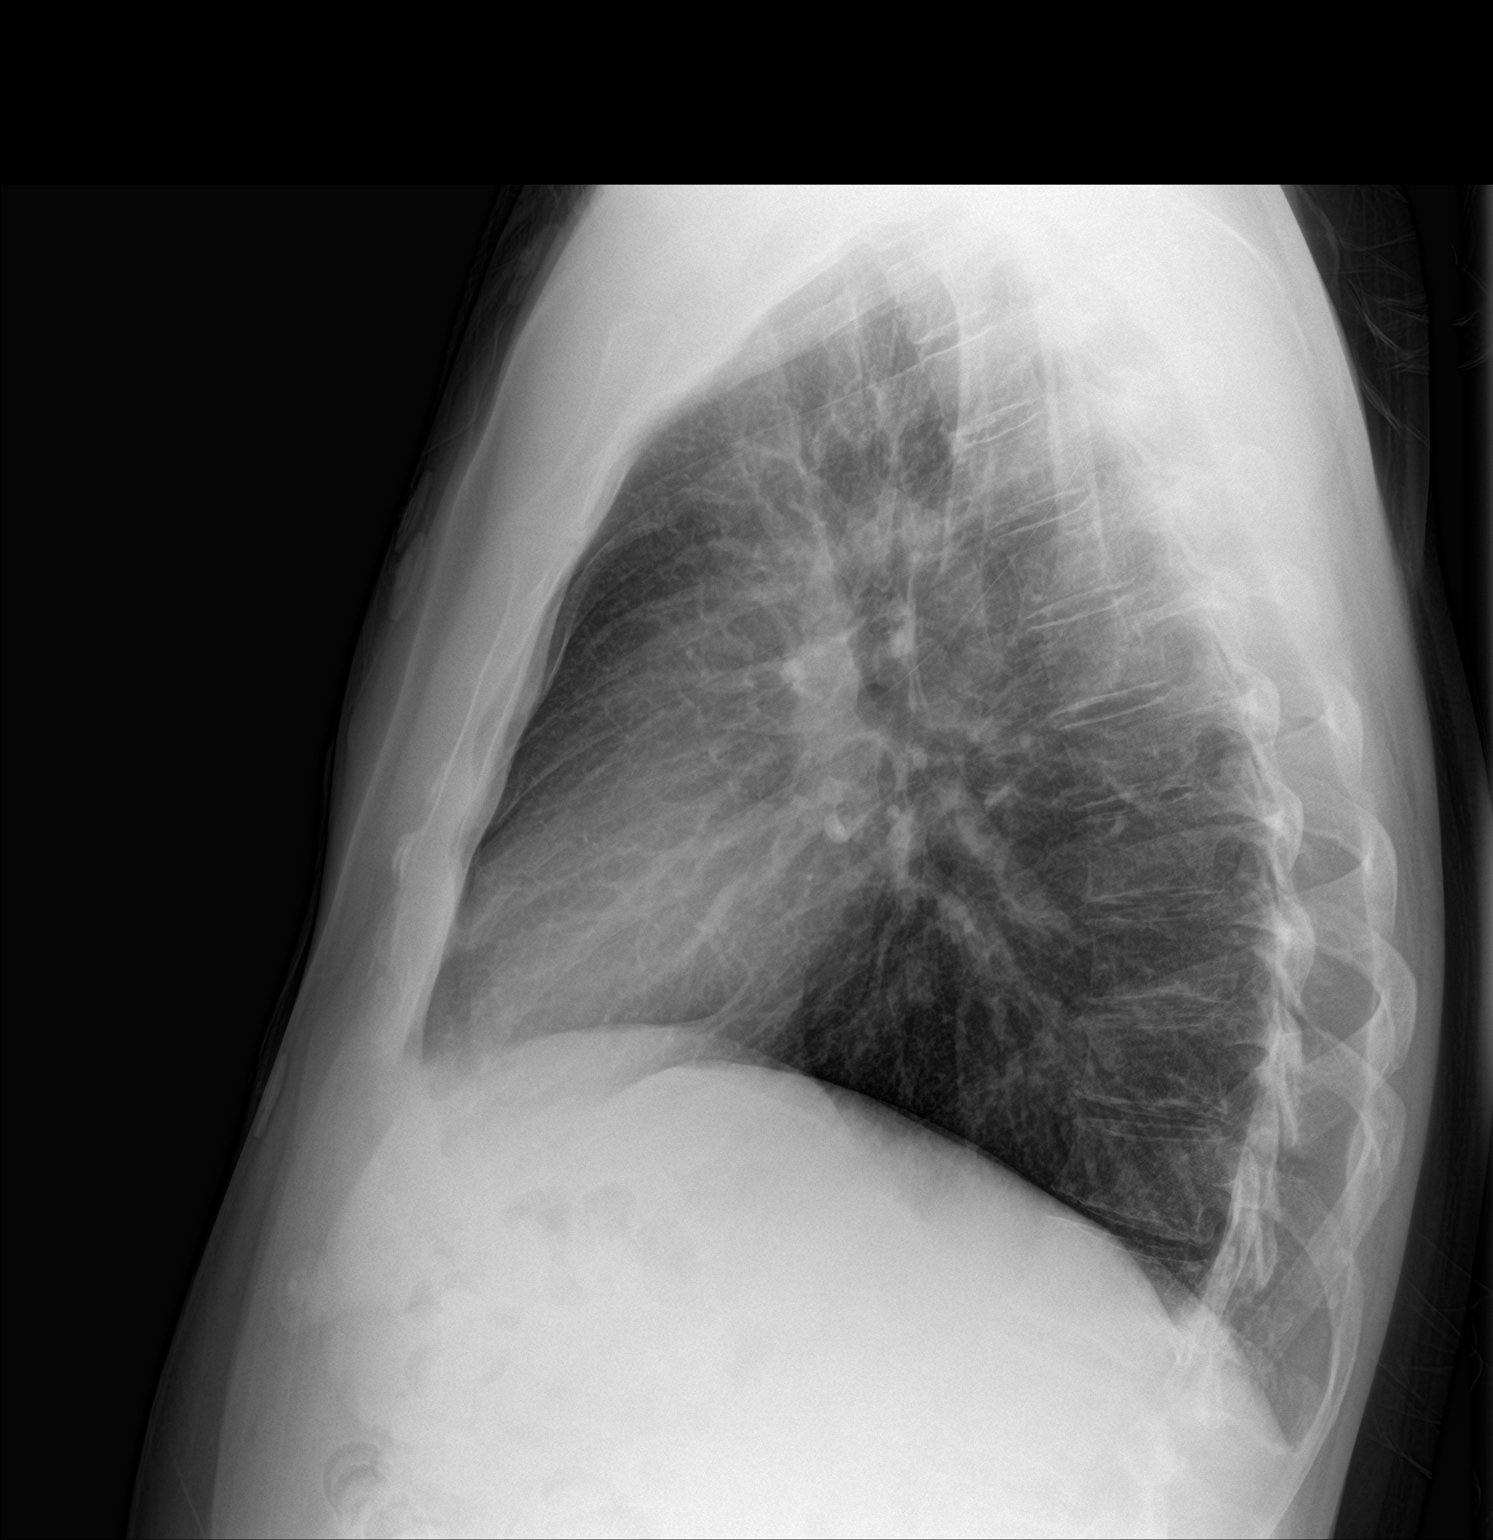

[2 of 2 positions shown; findings below may reference images not displayed]

FINDINGS: The heart size and mediastinal contours are within normal limits.
Both lungs are clear. The visualized skeletal structures are
unremarkable.
IMPRESSION: Normal chest x-ray.

## 2018-07-18 IMAGING — CT CT ANGIO CHEST
2 of 6 series · 18 of 36 positions shown · IV contrast (Omni 300)
Comparison: 07/16/2016 radiographs

CLINICAL DATA: Back pain for 1 week.

EXAM:
CT ANGIOGRAPHY CHEST WITH CONTRAST
TECHNIQUE: Multidetector CT imaging of the chest was performed using the
standard protocol during bolus administration of intravenous
contrast. Multiplanar CT image reconstructions and MIPs were
obtained to evaluate the vascular anatomy.
CONTRAST:  100 cc Isovue 370

[Series 7: pe thins · axial · 0.77mm/px · z∈[+1092,+1348]mm · 17 of 290 slices shown]
[im 17/290  lung]
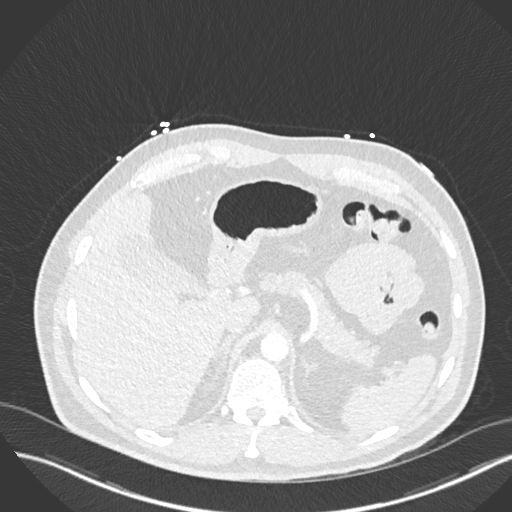
[im 33/290  mediastinal]
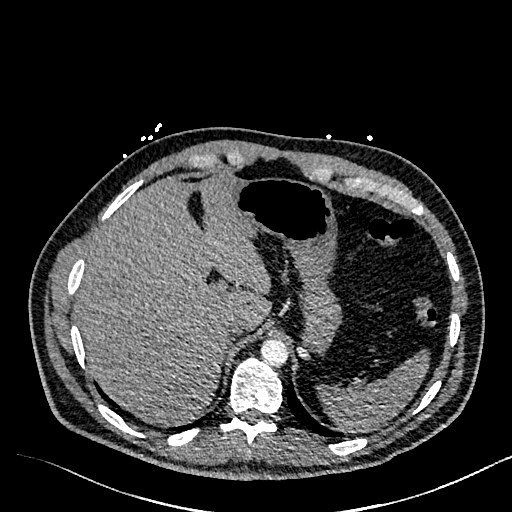
[im 49/290  lung]
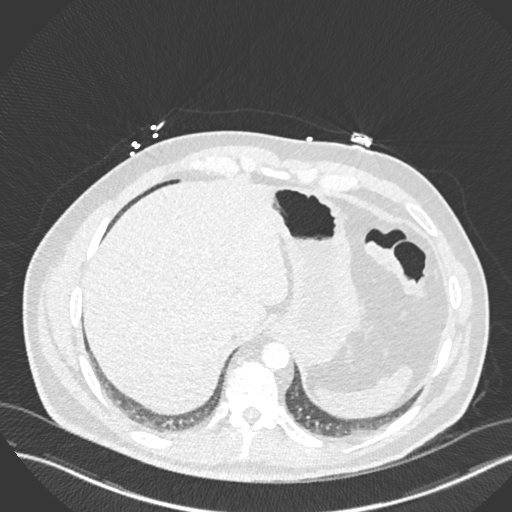
[im 65/290  mediastinal]
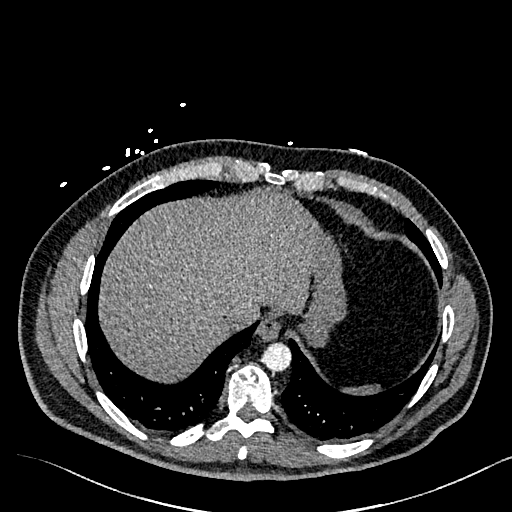
[im 81/290  lung]
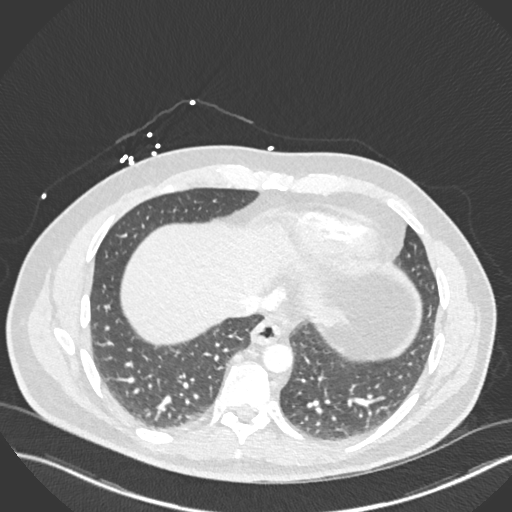
[im 97/290  mediastinal]
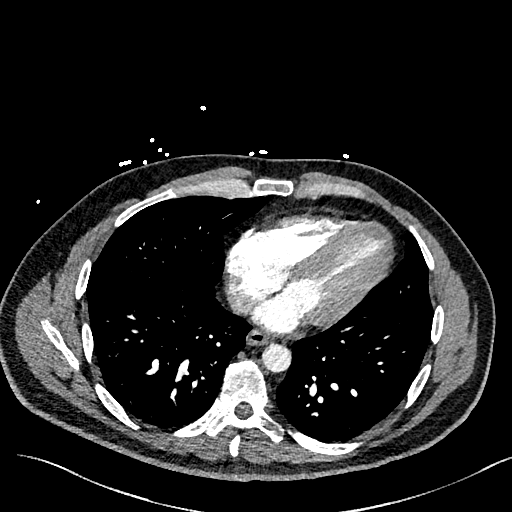
[im 113/290  lung]
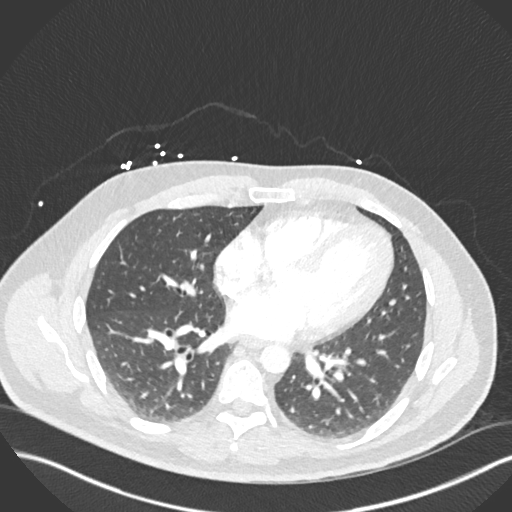
[im 129/290  mediastinal]
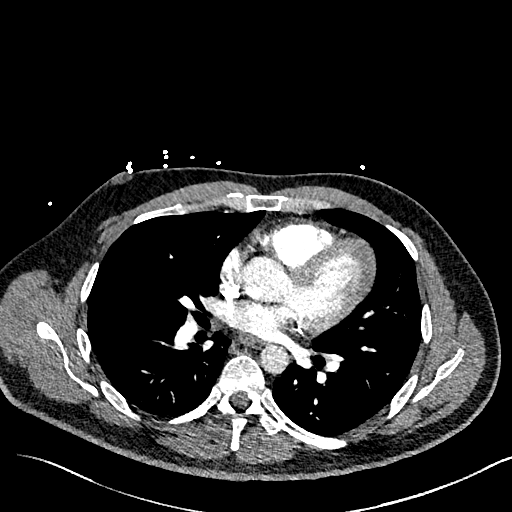
[im 145/290  lung]
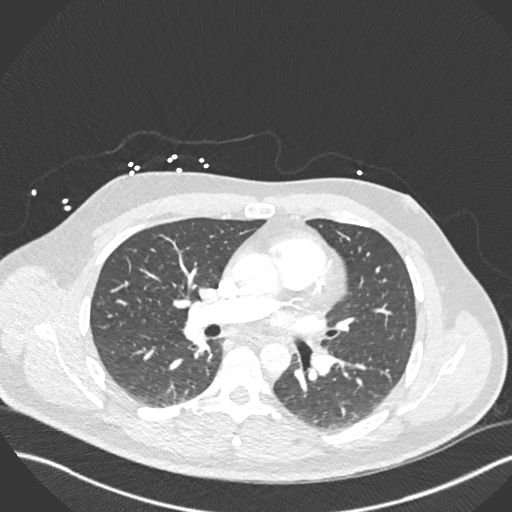
[im 161/290  mediastinal]
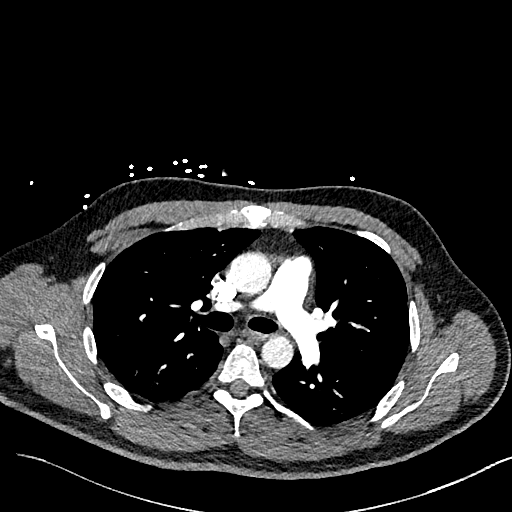
[im 177/290  lung]
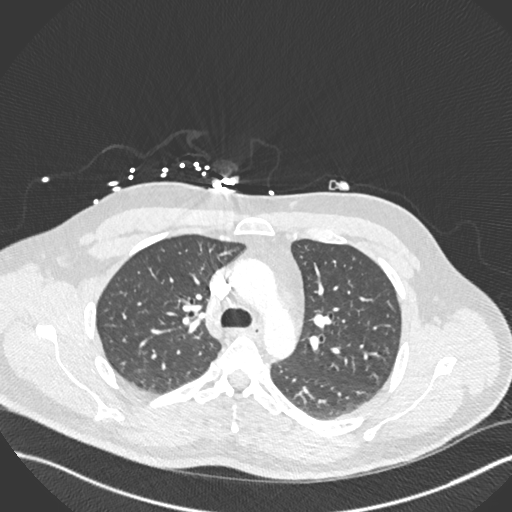
[im 193/290  mediastinal]
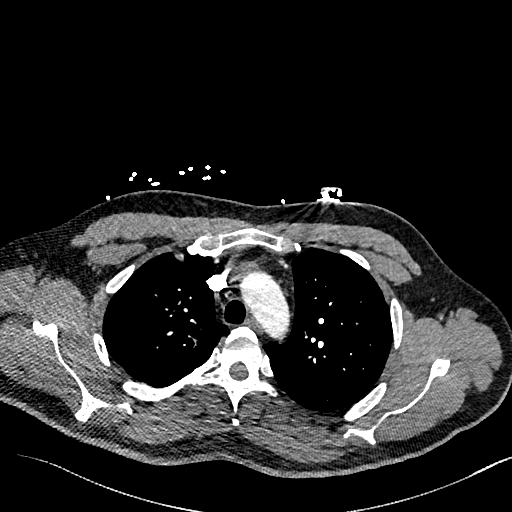
[im 209/290  lung]
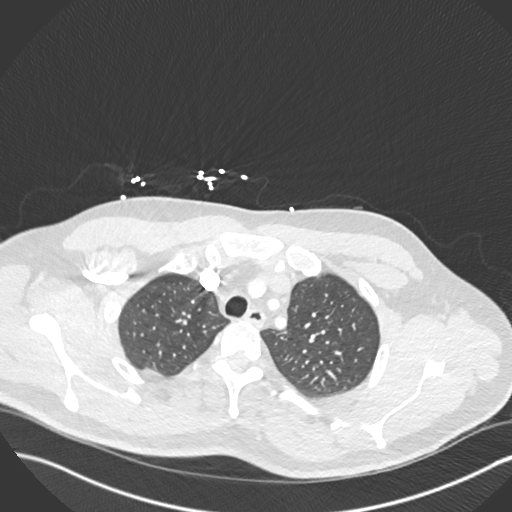
[im 225/290  mediastinal]
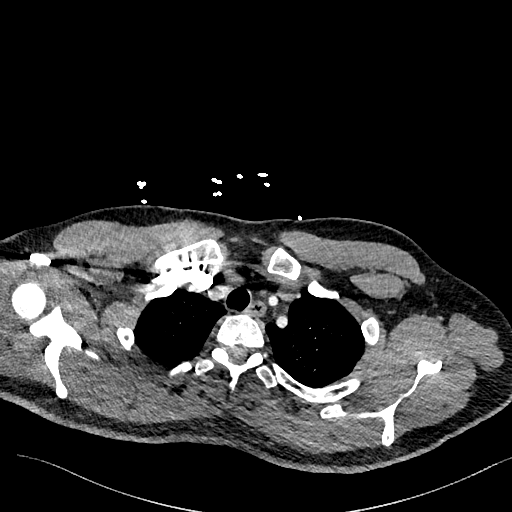
[im 241/290  lung]
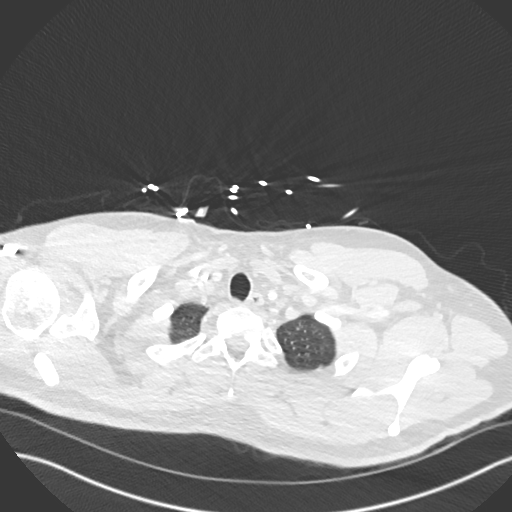
[im 257/290  mediastinal]
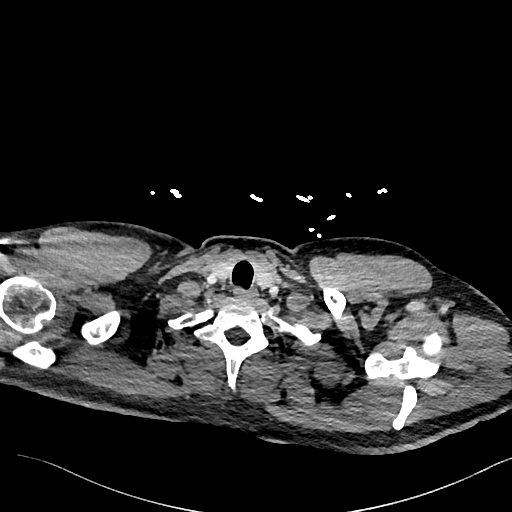
[im 273/290  lung]
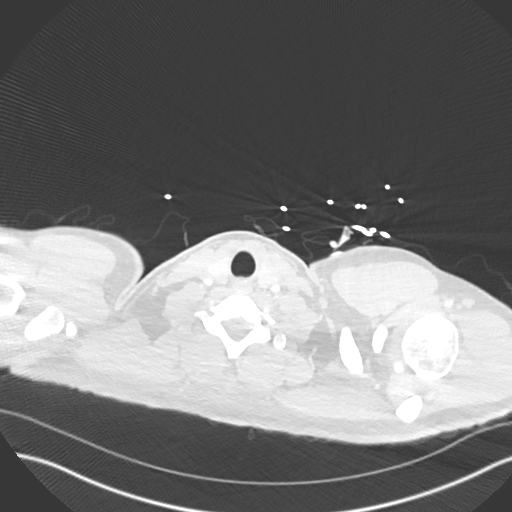

[Series 8: pe 2mm cor · coronal · 0.65mm/px · 1 of 151 slices shown]
[im 76/151  mediastinal]
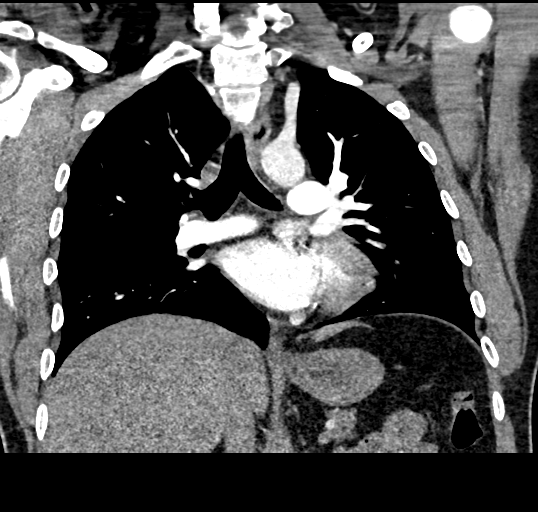

[18 of 36 positions shown; findings below may reference images not displayed]

FINDINGS: Cardiovascular: Timing on today' s exam was optimized for assessment
of the pulmonary arteries.

No filling defect is identified in the pulmonary arterial tree to
suggest pulmonary embolus. There is adequate contrast in the
systemic arterial supply to assess the aorta, and no dissection is
observed. No compelling findings of acute aortic injury. There does
appear to be coronary artery atherosclerosis involving the left
anterior descending and circumflex coronary arteries. This seems
advanced for age, and potentially disproportionate to
atherosclerosis in the aorta.

Mediastinum/Nodes: There is potentially some mild wall thickening in
the distal esophagus, a likely cause would be esophagitis.

Lungs/Pleura: Unremarkable

Upper Abdomen: Unremarkable

Musculoskeletal: Unremarkable

Review of the MIP images confirms the above findings.
IMPRESSION: 1. No filling defect is identified in the pulmonary arterial tree to
suggest pulmonary embolus. No acute aortic findings are identified.
2. Potential mild wall thickening in the distal esophagus,
esophagitis not excluded.

## 2018-07-19 ENCOUNTER — Other Ambulatory Visit: Payer: Self-pay | Admitting: Internal Medicine

## 2018-09-23 ENCOUNTER — Other Ambulatory Visit: Payer: Self-pay | Admitting: Internal Medicine

## 2018-09-24 ENCOUNTER — Other Ambulatory Visit: Payer: Self-pay | Admitting: Internal Medicine

## 2018-09-24 NOTE — Telephone Encounter (Signed)
Please schedule F/U appointment. Thank you! 

## 2018-09-24 NOTE — Telephone Encounter (Signed)
Scheduled 8/26

## 2018-10-19 ENCOUNTER — Other Ambulatory Visit: Payer: Self-pay | Admitting: Internal Medicine

## 2018-10-22 ENCOUNTER — Encounter: Payer: 59 | Admitting: Internal Medicine

## 2018-10-27 ENCOUNTER — Other Ambulatory Visit: Payer: Self-pay | Admitting: Internal Medicine

## 2018-11-05 ENCOUNTER — Ambulatory Visit (INDEPENDENT_AMBULATORY_CARE_PROVIDER_SITE_OTHER): Payer: 59 | Admitting: Internal Medicine

## 2018-11-05 ENCOUNTER — Encounter: Payer: Self-pay | Admitting: Internal Medicine

## 2018-11-05 ENCOUNTER — Other Ambulatory Visit: Payer: Self-pay

## 2018-11-05 VITALS — BP 112/68 | HR 78 | Ht 69.5 in | Wt 193.8 lb

## 2018-11-05 DIAGNOSIS — I2510 Atherosclerotic heart disease of native coronary artery without angina pectoris: Secondary | ICD-10-CM | POA: Diagnosis not present

## 2018-11-05 DIAGNOSIS — I10 Essential (primary) hypertension: Secondary | ICD-10-CM

## 2018-11-05 DIAGNOSIS — I255 Ischemic cardiomyopathy: Secondary | ICD-10-CM

## 2018-11-05 DIAGNOSIS — E785 Hyperlipidemia, unspecified: Secondary | ICD-10-CM

## 2018-11-05 NOTE — Patient Instructions (Signed)
Medication Instructions:  Your physician recommends that you continue on your current medications as directed. Please refer to the Current Medication list given to you today.  If you need a refill on your cardiac medications before your next appointment, please call your pharmacy.   Lab work: None ordered  If you have labs (blood work) drawn today and your tests are completely normal, you will receive your results only by: . MyChart Message (if you have MyChart) OR . A paper copy in the mail If you have any lab test that is abnormal or we need to change your treatment, we will call you to review the results.  Testing/Procedures: None ordered   Follow-Up: At [facility], you and your health needs are our priority.  As part of our continuing mission to provide you with exceptional heart care, we have created designated Provider Care Teams.  These Care Teams include your primary Cardiologist (physician) and Advanced Practice Providers (APPs -  Physician Assistants and Nurse Practitioners) who all work together to provide you with the care you need, when you need it. You will need a follow up appointment in 12 months.  Please call our office 2 months in advance to schedule this appointment.  You may see Christopher End, MD or one of the following Advanced Practice Providers on your designated Care Team:   Christopher Berge, NP Ryan Dunn, PA-C . Jacquelyn Visser, PA-C   

## 2018-11-05 NOTE — Progress Notes (Signed)
Follow-up Outpatient Visit Date: 11/05/2018  Primary Care Provider: Venia Carbon, MD [place] [address]  Chief Complaint: Follow-up coronary artery disease these  HPI:  [name] [name] is a 48 y.o. year-old male with history of coronary artery disease status post CABG (07/2016), hypertension, type 2 diabetes mellitus, and anxiety, who presents for follow-up of coronary artery disease and hyperlipidemia.  I last saw [name] [name] in January, at which time he was doing well.  No medication changes were made at that time.  Today, [name] [name] reports that he has been doing well.  His only complaint is of intermittent jumpiness in his legs, which has improved with use of ropinirole.  However, he also notices this sometimes during the day when he is driving and wonders if he can divide the doses throughout the day.  He denies chest pain, shortness of breath, palpitations, lightheadedness, orthopnea, PND, and edema.  He is exercising regularly without any difficulty.  He is due for follow-up with Dr. Silvio Pate next week, at which time routine labs (including fasting lipid panel) will be drawn.  --------------------------------------------------------------------------------------------------  Cardiovascular History & Procedures: Cardiovascular Problems:  Coronary artery disease status post CABG for unstable angina (07/2016)  Risk Factors:  Known coronary artery disease, hypertension, diabetes mellitus, and male gender  Cath/PCI:  LHC (07/19/16): LMCA normal. LAD 90% proximal and 80% mid stenoses. D1 and D2 both with 90% ostial/proximal lesions. Dominant LCx. OM1 with 80% proximal stenosis. lPDA proximally occluded, with distal vessel filling via left-to-left and right-to-left collaterals. Small, non-dominant RCA with 80% mid/distal stenosis. LVEF 55-65%. LVEDP 7 mmHg.  CV Surgery:  CABG (07/26/16, Dr. Roxy Manns): LIMA->LAD, RIMA->acute marginal of RCA, SVG->lPDA, SVG->OM1, and  SVG->D2.  EP Procedures and Devices:  None  Non-Invasive Evaluation(s):  Exercise MPI(07/19/16): High risk studywith large in size, severe anterior/anteroseptal reversible defect and 2 mm of ST depression in inferolateral leads. LVEF 42%.  Recent CV Pertinent Labs: Lab Results  Component Value Date   CHOL 141 09/17/2017   HDL 36 (L) 09/17/2017   LDLCALC 83 09/17/2017   TRIG 109 09/17/2017   CHOLHDL 3.9 09/17/2017   CHOLHDL 4.6 07/20/2016   INR 1.42 07/26/2016   K 4.4 02/28/2017   MG 2.0 07/27/2016   BUN 30 (H) 02/28/2017   CREATININE 0.89 02/28/2017    Past medical and surgical history were reviewed and updated in EPIC.  Current Meds  Medication Sig  . acetaminophen (TYLENOL) 500 MG tablet Take 2 tablets (1,000 mg total) by mouth every 6 (six) hours as needed.  Marland Kitchen aspirin 81 MG chewable tablet Chew 1 tablet (81 mg total) by mouth daily.  Marland Kitchen glimepiride (AMARYL) 4 MG tablet Take 1 tablet (4 mg total) by mouth daily before breakfast.  . LANTUS 100 UNIT/ML injection INJECT 0.25MLS (25 UNITS TOTAL) INTO THE SKIN TWICE DAILY  . lisinopril (PRINIVIL,ZESTRIL) 10 MG tablet Take 1 tablet by mouth once daily  . metFORMIN (GLUCOPHAGE) 1000 MG tablet TAKE 1 TABLET BY MOUTH TWICE DAILY WITH A MEAL  . metoprolol tartrate (LOPRESSOR) 25 MG tablet Take 1 tablet by mouth twice daily  . nitroGLYCERIN (NITROSTAT) 0.4 MG SL tablet Place 0.4 mg under the tongue every 5 (five) minutes as needed for chest pain.  Marland Kitchen rOPINIRole (REQUIP) 1 MG tablet TAKE 1 TABLET BY MOUTH THREE TIMES DAILY  . rosuvastatin (CRESTOR) 20 MG tablet Take 1 tablet by mouth once daily  . sertraline (ZOLOFT) 100 MG tablet Take 1 tablet (100 mg total)  by mouth daily.  . sildenafil (REVATIO) 20 MG tablet Take 20 mg by mouth. 3-5 as needed    Allergies: Simvastatin  Social History   Tobacco Use  . Smoking status: Never Smoker  . Smokeless tobacco: Never Used  Substance Use Topics  . Alcohol use: No  . Drug use: No     Family History  Problem Relation Age of Onset  . Heart failure Mother   . Diabetes Mother   . CAD Paternal Grandfather   . Heart attack Paternal Grandfather   . Heart attack Paternal Uncle   . Cancer Neg Hx     Review of Systems: A 12-system review of systems was performed and was negative except as noted in the HPI.  --------------------------------------------------------------------------------------------------  Physical Exam: BP 112/68 (BP Location: Left Arm, Patient Position: Sitting, Cuff Size: Normal)   Pulse 78   Ht 5' 9.5" (1.765 m)   Wt 193 lb 12 oz (87.9 kg)   BMI 28.20 kg/m   General: NAD. HEENT: No conjunctival pallor or scleral icterus.  Facemask in place. Neck: Supple without lymphadenopathy, thyromegaly, JVD, or HJR. Lungs: Normal work of breathing. Clear to auscultation bilaterally without wheezes or crackles. Heart: Regular rate and rhythm without murmurs, rubs, or gallops. Non-displaced PMI. Abd: Bowel sounds present. Soft, NT/ND without hepatosplenomegaly Ext: No lower extremity edema. Radial, PT, and DP pulses are 2+ bilaterally. Skin: Warm and dry without rash.  EKG: Normal sinus rhythm without abnormality.  Lab Results  Component Value Date   WBC 7.1 11/08/2016   HGB 14.0 11/08/2016   HCT 42.5 11/08/2016   MCV 93.7 11/08/2016   PLT 268.0 11/08/2016    Lab Results  Component Value Date   NA 139 02/28/2017   K 4.4 02/28/2017   CL 102 02/28/2017   CO2 21 02/28/2017   BUN 30 (H) 02/28/2017   CREATININE 0.89 02/28/2017   GLUCOSE 152 (H) 02/28/2017   ALT 48 (H) 09/17/2017    Lab Results  Component Value Date   CHOL 141 09/17/2017   HDL 36 (L) 09/17/2017   LDLCALC 83 09/17/2017   TRIG 109 09/17/2017   CHOLHDL 3.9 09/17/2017    --------------------------------------------------------------------------------------------------  ASSESSMENT AND PLAN: Coronary artery disease without angina: [name] [name] continues to do well following CABG  and stenting multivessel coronary artery disease in 07/2016.  We will continue his current medications for secondary prevention.  I encouraged him to continue to exercise.  Ischemic cardiomyopathy: [name] [name] appears euvolemic and well compensated with NYHA class I symptoms.  We will continue metoprolol and lisinopril at current doses.  Hypertension: Blood pressure well controlled today.  No medication changes.  Hyperlipidemia: [name] [name] is tolerating rosuvastatin well.  He did not have a fasting lipid panel performed after our last visit but is due to see Dr. Alphonsus SiasLetvak next week and believes that fasting labs will be checked at that time.  If LDL is still above 70, addition of ezetimibe versus a PCSK9 inhibitor will need to be considered.  We also discussed statin holiday to see if to be jumpiness in his legs improves, [name] [name] would like to defer this for now.  Follow-up: Return to clinic in 1 year.  Yvonne Kendallhristopher Porcha Deblanc, MD 11/05/2018 3:39 PM

## 2018-11-06 ENCOUNTER — Encounter: Payer: Self-pay | Admitting: Internal Medicine

## 2018-11-12 ENCOUNTER — Other Ambulatory Visit: Payer: Self-pay | Admitting: Internal Medicine

## 2018-11-12 ENCOUNTER — Encounter: Payer: Self-pay | Admitting: Internal Medicine

## 2018-11-12 ENCOUNTER — Other Ambulatory Visit: Payer: Self-pay

## 2018-11-12 ENCOUNTER — Ambulatory Visit (INDEPENDENT_AMBULATORY_CARE_PROVIDER_SITE_OTHER): Payer: 59 | Admitting: Internal Medicine

## 2018-11-12 VITALS — BP 112/84 | HR 84 | Temp 98.3°F | Ht 69.0 in | Wt 187.0 lb

## 2018-11-12 DIAGNOSIS — Z0001 Encounter for general adult medical examination with abnormal findings: Secondary | ICD-10-CM | POA: Diagnosis not present

## 2018-11-12 DIAGNOSIS — I10 Essential (primary) hypertension: Secondary | ICD-10-CM | POA: Diagnosis not present

## 2018-11-12 DIAGNOSIS — E1159 Type 2 diabetes mellitus with other circulatory complications: Secondary | ICD-10-CM

## 2018-11-12 DIAGNOSIS — F419 Anxiety disorder, unspecified: Secondary | ICD-10-CM

## 2018-11-12 DIAGNOSIS — L570 Actinic keratosis: Secondary | ICD-10-CM | POA: Diagnosis not present

## 2018-11-12 DIAGNOSIS — Z0000 Encounter for general adult medical examination without abnormal findings: Secondary | ICD-10-CM | POA: Insufficient documentation

## 2018-11-12 DIAGNOSIS — G2581 Restless legs syndrome: Secondary | ICD-10-CM

## 2018-11-12 DIAGNOSIS — I2510 Atherosclerotic heart disease of native coronary artery without angina pectoris: Secondary | ICD-10-CM | POA: Diagnosis not present

## 2018-11-12 LAB — CBC
HCT: 41.8 % (ref 39.0–52.0)
Hemoglobin: 14.3 g/dL (ref 13.0–17.0)
MCHC: 34.1 g/dL (ref 30.0–36.0)
MCV: 95.2 fl (ref 78.0–100.0)
Platelets: 252 10*3/uL (ref 150.0–400.0)
RBC: 4.39 Mil/uL (ref 4.22–5.81)
RDW: 12.8 % (ref 11.5–15.5)
WBC: 6.4 10*3/uL (ref 4.0–10.5)

## 2018-11-12 LAB — COMPREHENSIVE METABOLIC PANEL
ALT: 34 U/L (ref 0–53)
AST: 23 U/L (ref 0–37)
Albumin: 4.3 g/dL (ref 3.5–5.2)
Alkaline Phosphatase: 66 U/L (ref 39–117)
BUN: 19 mg/dL (ref 6–23)
CO2: 29 mEq/L (ref 19–32)
Calcium: 9.3 mg/dL (ref 8.4–10.5)
Chloride: 102 mEq/L (ref 96–112)
Creatinine, Ser: 0.97 mg/dL (ref 0.40–1.50)
GFR: 82.88 mL/min (ref >60.00)
Glucose, Bld: 186 mg/dL — ABNORMAL HIGH (ref 70–99)
Potassium: 4.8 mEq/L (ref 3.5–5.1)
Sodium: 136 mEq/L (ref 135–145)
Total Bilirubin: 0.5 mg/dL (ref 0.2–1.2)
Total Protein: 7 g/dL (ref 6.0–8.3)

## 2018-11-12 LAB — HEMOGLOBIN A1C: Hgb A1c MFr Bld: 9.1 % — ABNORMAL HIGH (ref 4.6–6.5)

## 2018-11-12 LAB — LIPID PANEL
Cholesterol: 127 mg/dL (ref 0–200)
HDL: 31.5 mg/dL — ABNORMAL LOW (ref >39.00)
LDL Cholesterol: 64 mg/dL (ref 0–99)
NonHDL: 95.24
Total CHOL/HDL Ratio: 4
Triglycerides: 155 mg/dL — ABNORMAL HIGH (ref 0.0–149.0)
VLDL: 31 mg/dL (ref 0.0–40.0)

## 2018-11-12 LAB — HM DIABETES FOOT EXAM

## 2018-11-12 MED ORDER — CARBIDOPA-LEVODOPA ER 25-100 MG PO TBCR: 1.0000 | EXTENDED_RELEASE_TABLET | Freq: Every day | ORAL | 11 refills | Status: DC

## 2018-11-12 NOTE — Assessment & Plan Note (Signed)
BP Readings from Last 3 Encounters:  11/12/18 112/84  11/05/18 112/68  05/16/18 124/84   Good control on heart meds

## 2018-11-12 NOTE — Assessment & Plan Note (Signed)
Controlled with sertraline/ 

## 2018-11-12 NOTE — Assessment & Plan Note (Signed)
2 lesions on forearm Verbal consent after discussion 30 seconds x 2 of liquid nitrogen--tolerated well Discussed home care

## 2018-11-12 NOTE — Assessment & Plan Note (Signed)
Has been quiet 

## 2018-11-12 NOTE — Progress Notes (Signed)
Subjective:    Patient ID: [name] MccoyBrandon Hoglund, male    DOB: 10/17/1970, 48 y.o.   MRN: [id number]  HPI Here for physical  Doing fine Recent cardiology appointment---no new issues  Hasn't been checking sugars No foot numbness, tingling or pain Has been exercising daily on new machine they got  Has spots on left arm and back Wife wants them checked  Not sleeping great--trouble "winding down" requip helps restless legs-- taking 3mg  but didn't tolerate 4mg  Not excited about a sleeping pill  Current Outpatient Medications on File Prior to Visit  Medication Sig Dispense Refill  . acetaminophen (TYLENOL) 500 MG tablet Take 2 tablets (1,000 mg total) by mouth every 6 (six) hours as needed. 30 tablet 0  . aspirin 81 MG chewable tablet Chew 1 tablet (81 mg total) by mouth daily. 30 tablet 0  . glimepiride (AMARYL) 4 MG tablet Take 1 tablet (4 mg total) by mouth daily before breakfast. 90 tablet 3  . LANTUS 100 UNIT/ML injection INJECT 0.25MLS (25 UNITS TOTAL) INTO THE SKIN TWICE DAILY 10 mL 11  . lisinopril (PRINIVIL,ZESTRIL) 10 MG tablet Take 1 tablet by mouth once daily 90 tablet 1  . metFORMIN (GLUCOPHAGE) 1000 MG tablet TAKE 1 TABLET BY MOUTH TWICE DAILY WITH A MEAL 180 tablet 3  . metoprolol tartrate (LOPRESSOR) 25 MG tablet Take 1 tablet by mouth twice daily 180 tablet 0  . nitroGLYCERIN (NITROSTAT) 0.4 MG SL tablet Place 0.4 mg under the tongue every 5 (five) minutes as needed for chest pain.    Marland Kitchen. rOPINIRole (REQUIP) 1 MG tablet TAKE 1 TABLET BY MOUTH THREE TIMES DAILY 270 tablet 0  . rosuvastatin (CRESTOR) 20 MG tablet Take 1 tablet by mouth once daily 90 tablet 0  . sertraline (ZOLOFT) 100 MG tablet Take 1 tablet (100 mg total) by mouth daily. 90 tablet 3  . sildenafil (REVATIO) 20 MG tablet Take 20 mg by mouth. 3-5 as needed     No current facility-administered medications on file prior to visit.     Allergies  Allergen Reactions  . Simvastatin Other (See Comments)    Elevated  LFTs?    Past Medical History:  Diagnosis Date  . Anxiety   . Coronary artery calcification    a. noted on CT angio 07/16/16  . Coronary artery disease   . High cholesterol   . HTN (hypertension)   . OSA on CPAP    "don't wear it that much" (07/19/2016)  . 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 Type II diabetes mellitus (HCC)     Past Surgical History:  Procedure Laterality Date  . CARDIAC CATHETERIZATION  07/19/2016  . CORONARY ARTERY BYPASS GRAFT N/A 07/26/2016   Procedure: CORONARY ARTERY BYPASS GRAFTING (CABG), ON PUMP, TIMES FIVE, USING BILATERAL INTERNAL MAMMARY ARTERIES AND ENDOSCOPICALLY HARVESTED RIGHT GREATER SAPHENOUS VEIN;  Surgeon: Purcell Nailswen, Clarence H, MD;  [facility];  Service: Open Heart Surgery;  Laterality: N/A;  LIMA to LAD RIMA to Acute Marginal SVG to PDA SVG to Diag 2 SVG to OM1  . LEFT HEART CATH AND CORONARY ANGIOGRAPHY N/A 07/19/2016   Procedure: Left Heart Cath and Coronary Angiography;  Surgeon: Corky CraftsVaranasi, Jayadeep S, MD;  [facility];  Service: Cardiovascular;  Laterality: N/A;  . ORCHIOPEXY Right ~ 1980   "undescending testicle"  . TEE WITHOUT CARDIOVERSION N/A 07/26/2016  Procedure: TRANSESOPHAGEAL ECHOCARDIOGRAM (TEE);  Surgeon: Purcell Nails, MD;  Location: [facility] OR;  Service: Open Heart Surgery;  Laterality: N/A;    Family History  Problem Relation Age of Onset  . Heart failure Mother   . Diabetes Mother   . CAD Paternal Grandfather   . Heart attack Paternal Grandfather   . Heart attack Paternal Uncle   . Cancer Neg Hx     Social History   Socioeconomic History  . Marital status: Married    Spouse name: Not on file  . Number of children: 0  . Years of education: Not on file  . Highest education level: Not on file  Occupational History  . 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 Financial resource strain: Not on file   . Food insecurity    Worry: Not on file    Inability: Not on file  . Transportation needs    Medical: Not on file    Non-medical: Not on file  Tobacco Use  . Smoking status: Never Smoker  . Smokeless tobacco: Never Used  Substance and Sexual Activity  . Alcohol use: No  . Drug use: No  . Sexual activity: Yes  Lifestyle  . Physical activity    Days per week: Not on file    Minutes per session: Not on file  . Stress: Not on file  Relationships  . Social Musician on phone: Not on file    Gets together: Not on file    Attends religious service: Not on file    Active member of club or organization: Not on file    Attends meetings of clubs or organizations: Not on file    Relationship status: Not on file  . Intimate partner violence    Fear of current or ex partner: Not on file    Emotionally abused: Not on file    Physically abused: Not on file    Forced sexual activity: Not on file  Other Topics Concern  . Not on file  Social History Narrative   Divorced then remarried 2015   2 stepchildren from prior marriage   2 stepsons from this marriage   Review of Systems  Constitutional: Negative for fatigue.       Weight up some with COVID and now back down some Wears seat belt  HENT: Negative for dental problem, hearing loss, tinnitus and trouble swallowing.        Overdue for dentist  Eyes: Negative for visual disturbance.       Due for eye exam  Respiratory: Negative for cough, chest tightness and shortness of breath.   Cardiovascular: Negative for chest pain, palpitations and leg swelling.  Gastrointestinal: Negative for abdominal pain, blood in stool and constipation.       Uses OTC med to prevent heartburn (omeprazole)  Endocrine: Negative for polydipsia and polyuria.  Genitourinary: Negative for difficulty urinating and urgency.       Some ED  Musculoskeletal: Negative for arthralgias, back pain and joint swelling.  Skin: Negative for rash.   Allergic/Immunologic: Negative for environmental allergies and immunocompromised state.  Neurological: Negative for dizziness, syncope, light-headedness and headaches.  Hematological: Negative for adenopathy. Does not bruise/bleed easily.  Psychiatric/Behavioral: Positive for sleep disturbance. Negative for dysphoric mood. The patient is not nervous/anxious.        Objective:   Physical Exam  Constitutional: He is oriented to person, place, and time. He appears well-developed. No distress.  HENT:  Head: Normocephalic and atraumatic.  Right Ear: External ear normal.  Left Ear: External ear normal.  Mouth/Throat: Oropharynx is clear and moist. No oropharyngeal exudate.  Eyes: Pupils are equal, round, and reactive to light. Conjunctivae are normal.  Neck: No thyromegaly present.  Cardiovascular: Normal rate, regular rhythm, normal heart sounds and intact distal pulses. Exam reveals no gallop.  No murmur heard. Respiratory: Effort normal and breath sounds normal. No respiratory distress. He has no wheezes. He has no rales.  GI: Soft. There is no abdominal tenderness.  Lymphadenopathy:    He has no cervical adenopathy.  Neurological: He is alert and oriented to person, place, and time.  Normal sensation in feet  Skin:  No foot lesions 2 small actinics on left forearm seb keratosis on back  Psychiatric: He has a normal mood and affect. His behavior is normal.           Assessment & Plan:

## 2018-11-12 NOTE — Assessment & Plan Note (Signed)
Will add sinemet and titrate If does better, wean the ropinirole

## 2018-11-12 NOTE — Assessment & Plan Note (Signed)
If over 8%, will give him 3 months to improve

## 2018-11-12 NOTE — Assessment & Plan Note (Signed)
Healthy Let himself go a little--but now back to regular exercise Flu vaccine recommended--he is not excited about it but will consider No cancer screening yet

## 2018-11-16 ENCOUNTER — Other Ambulatory Visit: Payer: Self-pay | Admitting: Internal Medicine

## 2018-11-20 ENCOUNTER — Other Ambulatory Visit: Payer: Self-pay

## 2018-11-20 NOTE — Telephone Encounter (Signed)
Opened in error

## 2018-11-23 ENCOUNTER — Other Ambulatory Visit: Payer: Self-pay | Admitting: Internal Medicine

## 2018-12-28 ENCOUNTER — Other Ambulatory Visit: Payer: Self-pay | Admitting: Internal Medicine

## 2019-01-18 ENCOUNTER — Other Ambulatory Visit: Payer: Self-pay | Admitting: Internal Medicine

## 2019-02-08 ENCOUNTER — Other Ambulatory Visit: Payer: Self-pay | Admitting: Internal Medicine

## 2019-02-15 ENCOUNTER — Other Ambulatory Visit: Payer: Self-pay | Admitting: Internal Medicine

## 2019-02-16 ENCOUNTER — Other Ambulatory Visit: Payer: 59

## 2019-03-12 ENCOUNTER — Other Ambulatory Visit: Payer: Self-pay

## 2019-03-12 ENCOUNTER — Encounter: Payer: Self-pay | Admitting: Emergency Medicine

## 2019-03-12 ENCOUNTER — Ambulatory Visit
Admission: EM | Admit: 2019-03-12 | Discharge: 2019-03-12 | Disposition: A | Discharge status: Home / Self Care | Payer: 59 | Attending: Emergency Medicine | Admitting: Emergency Medicine

## 2019-03-12 ENCOUNTER — Other Ambulatory Visit: Payer: Self-pay | Admitting: Internal Medicine

## 2019-03-12 ENCOUNTER — Ambulatory Visit: Payer: Self-pay

## 2019-03-12 DIAGNOSIS — Z760 Encounter for issue of repeat prescription: Secondary | ICD-10-CM

## 2019-03-12 MED ORDER — INSULIN GLARGINE 100 UNIT/ML ~~LOC~~ SOLN: 25.0000 [IU] | Freq: Two times a day (BID) | SUBCUTANEOUS | 0 refills | Status: DC

## 2019-03-12 NOTE — ED Provider Notes (Signed)
[name]    CSN: [id number] Arrival date & time: 03/12/19  1748      History   Chief Complaint Chief Complaint  Patient presents with  . medication refill Lantus    HPI [name] MccoyBrandon [name] is a 48 y.o. male.   Patient presents with request for refill on his Lantus.  He took his last dose today and does not have refills.  He has an appointment with his PCP on 03/18/2018.  He thought he had additional vials.  He is asymptomatic and denies abdominal pain or other concerns.    The history is provided by the patient.    Past Medical History:  Diagnosis Date  . Anxiety   . Coronary artery calcification    a. noted on CT angio 07/16/16  . Coronary artery disease   . High cholesterol   . HTN (hypertension)   . OSA on CPAP    "don't wear it that much" (07/19/2016)  . 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 Type II diabetes mellitus [facility](HCC)     Patient Active Problem List   Diagnosis Date Noted  . Preventative health care 11/12/2018  . Actinic keratosis 11/12/2018  . Ischemic cardiomyopathy 02/21/2017  . Transaminitis 11/26/2016  . RLS (restless legs syndrome) 11/08/2016  . 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 Coronary artery disease involving native coronary artery of native heart without angina pectoris   . Type 2 diabetes mellitus with circulatory disorder (HCC)   . Hyperlipidemia LDL goal <70   . HTN (hypertension)   . Anxiety     Past Surgical History:  Procedure Laterality Date  . CARDIAC CATHETERIZATION  07/19/2016  . CORONARY ARTERY BYPASS GRAFT N/A 07/26/2016   Procedure: CORONARY ARTERY BYPASS GRAFTING (CABG), ON PUMP, TIMES FIVE, USING BILATERAL INTERNAL MAMMARY ARTERIES AND ENDOSCOPICALLY HARVESTED RIGHT GREATER SAPHENOUS VEIN;  Surgeon: Purcell Nailswen, Clarence H, MD;  [facility];  Service: Open Heart Surgery;  Laterality: N/A;  LIMA to LAD RIMA to Acute Marginal SVG to PDA SVG to Diag 2 SVG to  OM1  . LEFT HEART CATH AND CORONARY ANGIOGRAPHY N/A 07/19/2016   Procedure: Left Heart Cath and Coronary Angiography;  Surgeon: Corky CraftsVaranasi, Jayadeep S, MD;  [facility];  Service: Cardiovascular;  Laterality: N/A;  . ORCHIOPEXY Right ~ 1980   "undescending testicle"  . TEE WITHOUT CARDIOVERSION N/A 07/26/2016   Procedure: TRANSESOPHAGEAL ECHOCARDIOGRAM (TEE);  Surgeon: Purcell Nailswen, Clarence H, MD;  [facility];  Service: Open Heart Surgery;  Laterality: N/A;       Home Medications    Prior to Admission medications   Medication Sig Start Date End Date Taking? Authorizing Provider  acetaminophen (TYLENOL) 500 MG tablet Take 2 tablets (1,000 mg total) by mouth every 6 (six) hours as needed. 07/31/16   Barrett, Rae RoamErin R, PA-C  aspirin 81 MG chewable tablet Chew 1 tablet (81 mg total) by mouth daily. 07/16/16   Arby BarrettePfeiffer, Marcy, MD  Carbidopa-Levodopa ER (SINEMET CR) 25-100 MG tablet controlled release Take 1-2 tablets by mouth at bedtime. 11/12/18   [name], [name], MD  glimepiride (AMARYL) 4 MG tablet Take 1 tablet (4 mg total) by mouth daily before breakfast. 06/16/18   [name], [name], MD  insulin glargine (LANTUS) 100 UNIT/ML injection Inject 0.25 mLs (25 Units total) into the skin 2 (two) times daily. 03/12/19  Mickie Bail, NP  LANTUS 100 UNIT/ML injection INJECT 0.25MLS (25 UNITS TOTAL) INTO THE SKIN TWICE DAILY 06/23/18   Tillman Abide I, MD  lisinopril (ZESTRIL) 10 MG tablet Take 1 tablet by mouth once daily 11/24/18   End, Cristal Deer, MD  metFORMIN (GLUCOPHAGE) 1000 MG tablet TAKE 1 TABLET BY MOUTH TWICE DAILY WITH A MEAL 02/16/19   Karie Schwalbe, MD  metoprolol tartrate (LOPRESSOR) 25 MG tablet Take 1 tablet by mouth twice daily 01/19/19   End, Cristal Deer, MD  nitroGLYCERIN (NITROSTAT) 0.4 MG SL tablet Place 0.4 mg under the tongue every 5 (five) minutes as needed for chest pain.    [provider]  rOPINIRole (REQUIP) 1 MG tablet TAKE 1 TABLET BY MOUTH THREE TIMES  DAILY 02/09/19   Karie Schwalbe, MD  rosuvastatin (CRESTOR) 20 MG tablet Take 1 tablet by mouth once daily 12/29/18   End, Cristal Deer, MD  sertraline (ZOLOFT) 100 MG tablet Take 1 tablet (100 mg total) by mouth daily. 06/16/18   Karie Schwalbe, MD  sildenafil (REVATIO) 20 MG tablet Take 20 mg by mouth. 3-5 as needed    [provider]    Family History Family History  Problem Relation Age of Onset  . Heart failure Mother   . Diabetes Mother   . CAD Paternal Grandfather   . Heart attack Paternal Grandfather   . Heart attack Paternal Uncle   . Cancer Neg Hx     Social History Social History   Tobacco Use  . Smoking status: Never Smoker  . Smokeless tobacco: Never Used  Substance Use Topics  . Alcohol use: No  . Drug use: No     Allergies   Simvastatin   Review of Systems Review of Systems  Constitutional: Negative for appetite change, chills and fever.  HENT: Negative for ear pain and sore throat.   Eyes: Negative for pain and visual disturbance.  Respiratory: Negative for cough and shortness of breath.   Cardiovascular: Negative for chest pain and palpitations.  Gastrointestinal: Negative for abdominal pain, diarrhea, nausea and vomiting.  Genitourinary: Negative for dysuria and hematuria.  Musculoskeletal: Negative for arthralgias and back pain.  Skin: Negative for color change and rash.  Neurological: Negative for seizures and syncope.  All other systems reviewed and are negative.    Physical Exam Triage Vital Signs ED Triage Vitals  Enc Vitals Group     BP      Pulse      Resp      Temp      Temp src      SpO2      Weight      Height      Head Circumference      Peak Flow      Pain Score      Pain Loc      Pain Edu?      Excl. in GC?    No data found.  Updated Vital Signs BP (!) 149/84   Pulse 76   Temp 97.8 F (36.6 C) (Oral)   Resp 16   Wt 185 lb (83.9 kg)   SpO2 95%   BMI 27.32 kg/m   Visual Acuity Right Eye  Distance:   Left Eye Distance:   Bilateral Distance:    Right Eye Near:   Left Eye Near:    Bilateral Near:     Physical Exam Vitals and nursing note reviewed.  Constitutional:      General: He is  not in acute distress.    Appearance: He is well-developed. He is not ill-appearing.  HENT:     Head: Normocephalic and atraumatic.     Mouth/Throat:     Mouth: Mucous membranes are moist.     Pharynx: Oropharynx is clear.  Eyes:     Conjunctiva/sclera: Conjunctivae normal.  Cardiovascular:     Rate and Rhythm: Normal rate and regular rhythm.     Heart sounds: No murmur.  Pulmonary:     Effort: Pulmonary effort is normal. No respiratory distress.     Breath sounds: Normal breath sounds.  Abdominal:     General: Bowel sounds are normal.     Palpations: Abdomen is soft.     Tenderness: There is no abdominal tenderness. There is no guarding or rebound.  Musculoskeletal:     Cervical back: Neck supple.  Skin:    General: Skin is warm and dry.     Findings: No rash.  Neurological:     General: No focal deficit present.     Mental Status: He is alert and oriented to person, place, and time.  Psychiatric:        Mood and Affect: Mood normal.        Behavior: Behavior normal.      UC Treatments / Results  Labs (all labs ordered are listed, but only abnormal results are displayed) Labs Reviewed - No data to display  EKG   Radiology No results found.  Procedures Procedures (including critical care time)  Medications Ordered in UC Medications - No data to display  Initial Impression / Assessment and Plan / UC Course  I have reviewed the triage vital signs and the nursing notes.  Pertinent labs & imaging results that were available during my care of the patient were reviewed by me and considered in my medical decision making (see chart for details).    Medication refill on Lantus.  Patient is well-appearing and asymptomatic; his exam is unremarkable.  Instructed  patient to follow up with his PCP as scheduled on 03/19/2019.  Patient agrees to plan of care.     Final Clinical Impressions(s) / UC Diagnoses   Final diagnoses:  Medication refill     Discharge Instructions     Use the Lantus as directed.  Follow up with your primary care provider as scheduled.      ED Prescriptions    Medication Sig Dispense Auth. Provider   insulin glargine (LANTUS) 100 UNIT/ML injection Inject 0.25 mLs (25 Units total) into the skin 2 (two) times daily. 10 mL Sharion Balloon, NP     PDMP not reviewed this encounter.   Sharion Balloon, NP 03/12/19 [phone]

## 2019-03-12 NOTE — Telephone Encounter (Signed)
Telephone call from Patient calling for a request of refill of Lantus.. Related to Patient that the office is closed   Patient states that he  Realized that  Its his fault .   Just dosen't have any left.  Marland Kitchen

## 2019-03-12 NOTE — ED Triage Notes (Signed)
Patient in office today requesting refill of Lantus

## 2019-03-12 NOTE — Telephone Encounter (Signed)
Patient called in stating he is completely out of medication and needs it tonight. Please advise.

## 2019-03-12 NOTE — Discharge Instructions (Addendum)
Use the Lantus as directed.  Follow up with your primary care provider as scheduled.

## 2019-03-16 NOTE — Telephone Encounter (Signed)
I refilled the medication this morning at request of the pharmacy

## 2019-03-17 NOTE — Telephone Encounter (Signed)
Per med list lantus was refilled by Wendee Beavers NP to [facility] [address].

## 2019-03-19 ENCOUNTER — Other Ambulatory Visit (INDEPENDENT_AMBULATORY_CARE_PROVIDER_SITE_OTHER): Payer: 59

## 2019-03-19 ENCOUNTER — Other Ambulatory Visit: Payer: Self-pay

## 2019-03-19 DIAGNOSIS — E1159 Type 2 diabetes mellitus with other circulatory complications: Secondary | ICD-10-CM | POA: Diagnosis not present

## 2019-03-19 LAB — POCT GLYCOSYLATED HEMOGLOBIN (HGB A1C): Hemoglobin A1C: 11.3 % — AB (ref 4.0–5.6)

## 2019-03-26 ENCOUNTER — Ambulatory Visit (INDEPENDENT_AMBULATORY_CARE_PROVIDER_SITE_OTHER): Payer: 59 | Admitting: Internal Medicine

## 2019-03-26 ENCOUNTER — Other Ambulatory Visit: Payer: Self-pay

## 2019-03-26 ENCOUNTER — Encounter: Payer: Self-pay | Admitting: Internal Medicine

## 2019-03-26 VITALS — Ht 69.0 in | Wt 188.0 lb

## 2019-03-26 DIAGNOSIS — R7401 Elevation of levels of liver transaminase levels: Secondary | ICD-10-CM

## 2019-03-26 DIAGNOSIS — G2581 Restless legs syndrome: Secondary | ICD-10-CM | POA: Diagnosis not present

## 2019-03-26 DIAGNOSIS — E1159 Type 2 diabetes mellitus with other circulatory complications: Secondary | ICD-10-CM

## 2019-03-26 DIAGNOSIS — Z794 Long term (current) use of insulin: Secondary | ICD-10-CM

## 2019-03-26 MED ORDER — CLONAZEPAM 0.5 MG PO TABS: 0.5000 mg | ORAL_TABLET | Freq: Every day | ORAL | 0 refills | Status: DC

## 2019-03-26 NOTE — Patient Instructions (Signed)
Please continue your better work with healthy eating and cutting out carbs. If your sugars stay over 130 in 1 week, increase your insulin by 2 units every 1-2 days (alternate adding it to the morning and evening doses) until your fasting sugars are regularly under 130-140.  For the restless legs, stop the carbidopa-levodopa since it doesn't seem to be helping. Continue the ropinirole. Add clonazepam--start with one pill at bedtime and increase to 2 if needed to calm the restless legs. If you need to leave the room, you should take your CPAP

## 2019-03-26 NOTE — Assessment & Plan Note (Signed)
Totally out of control He knows he has not been compliant with diet He is doing better now Will continue Plan to start titrating insulin up in 1 week if he is still over 130-140 fasting

## 2019-03-26 NOTE — Progress Notes (Signed)
Subjective:    Patient ID: [name] [name], male    DOB: 1970/07/31, 49 y.o.   MRN: [id number]  HPI Video virtual visit for diabetes out of control Identification done Reviewed billing and he gave consent Participants-- patient is in his truck and I am in my office  He is not sure how his control got so bad Had has started cutting back on eating out for lunch Has been checking this week---over 200 some days When he is watching carbs better--he is under 110  Current Outpatient Medications on File Prior to Visit  Medication Sig Dispense Refill  . acetaminophen (TYLENOL) 500 MG tablet Take 2 tablets (1,000 mg total) by mouth every 6 (six) hours as needed. 30 tablet 0  . aspirin 81 MG chewable tablet Chew 1 tablet (81 mg total) by mouth daily. 30 tablet 0  . Carbidopa-Levodopa ER (SINEMET CR) 25-100 MG tablet controlled release Take 1-2 tablets by mouth at bedtime. 60 tablet 11  . glimepiride (AMARYL) 4 MG tablet Take 1 tablet (4 mg total) by mouth daily before breakfast. 90 tablet 3  . insulin glargine (LANTUS) 100 UNIT/ML injection Inject 0.25 mLs (25 Units total) into the skin 2 (two) times daily. 10 mL 0  . lisinopril (ZESTRIL) 10 MG tablet Take 1 tablet by mouth once daily 90 tablet 3  . metFORMIN (GLUCOPHAGE) 1000 MG tablet TAKE 1 TABLET BY MOUTH TWICE DAILY WITH A MEAL 180 tablet 3  . metoprolol tartrate (LOPRESSOR) 25 MG tablet Take 1 tablet by mouth twice daily 180 tablet 0  . nitroGLYCERIN (NITROSTAT) 0.4 MG SL tablet Place 0.4 mg under the tongue every 5 (five) minutes as needed for chest pain.    Marland Kitchen rOPINIRole (REQUIP) 1 MG tablet TAKE 1 TABLET BY MOUTH THREE TIMES DAILY 270 tablet 3  . rosuvastatin (CRESTOR) 20 MG tablet Take 1 tablet by mouth once daily 90 tablet 2  . sertraline (ZOLOFT) 100 MG tablet Take 1 tablet (100 mg total) by mouth daily. 90 tablet 3  . sildenafil (REVATIO) 20 MG tablet Take 20 mg by mouth. 3-5 as needed     No current facility-administered medications  on file prior to visit.    Allergies  Allergen Reactions  . Simvastatin Other (See Comments)    Elevated LFTs?    Past Medical History:  Diagnosis Date  . Anxiety   . Coronary artery calcification    a. noted on CT angio 07/16/16  . Coronary artery disease   . High cholesterol   . HTN (hypertension)   . OSA on CPAP    "don't wear it that much" (07/19/2016)  . 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 Type II diabetes mellitus (HCC)     Past Surgical History:  Procedure Laterality Date  . CARDIAC CATHETERIZATION  07/19/2016  . CORONARY ARTERY BYPASS GRAFT N/A 07/26/2016   Procedure: CORONARY ARTERY BYPASS GRAFTING (CABG), ON PUMP, TIMES FIVE, USING BILATERAL INTERNAL MAMMARY ARTERIES AND ENDOSCOPICALLY HARVESTED RIGHT GREATER SAPHENOUS VEIN;  Surgeon: Purcell Nails, MD;  Location: [facility] OR;  Service: Open Heart Surgery;  Laterality: N/A;  LIMA to LAD RIMA to Acute Marginal SVG to PDA SVG to Diag 2 SVG to OM1  . LEFT HEART CATH AND CORONARY ANGIOGRAPHY N/A 07/19/2016   Procedure: Left Heart Cath and Coronary Angiography;  Surgeon: Corky Crafts, MD;  Location: [facility]  INVASIVE CV LAB;  Service: Cardiovascular;  Laterality: N/A;  . ORCHIOPEXY Right ~ 1980   "undescending testicle"  . TEE WITHOUT CARDIOVERSION N/A 07/26/2016   Procedure: TRANSESOPHAGEAL ECHOCARDIOGRAM (TEE);  Surgeon: Rexene Alberts, MD;  Location: [place];  Service: Open Heart Surgery;  Laterality: N/A;    Family History  Problem Relation Age of Onset  . Heart failure Mother   . Diabetes Mother   . CAD Paternal Grandfather   . Heart attack Paternal Grandfather   . Heart attack Paternal Uncle   . Cancer Neg Hx     Social History   Socioeconomic History  . Marital status: Married    Spouse name: Not on file  . Number of children: 0  . Years of education: Not on file  . Highest education level: Not on file  Occupational History   . 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[employer]  Tobacco Use  . Smoking status: Never Smoker  . Smokeless tobacco: Never Used  Substance and Sexual Activity  . Alcohol use: No  . Drug use: No  . Sexual activity: Yes  Other Topics Concern  . Not on file  Social History Narrative   Divorced then remarried 2015   2 stepchildren from prior marriage   2 stepsons from this marriage   Social Determinants of Health   Financial Resource Strain:   . Difficulty of Paying Living Expenses: Not on file  Food Insecurity:   . Worried About Charity fundraiser in the Last Year: Not on file  . Ran Out of Food in the Last Year: Not on file  Transportation Needs:   . Lack of Transportation (Medical): Not on file  . Lack of Transportation (Non-Medical): Not on file  Physical Activity:   . Days of Exercise per Week: Not on file  . Minutes of Exercise per Session: Not on file  Stress:   . Feeling of Stress : Not on file  Social Connections:   . Frequency of Communication with Friends and Family: Not on file  . Frequency of Social Gatherings with Friends and Family: Not on file  . Attends Religious Services: Not on file  . Active Member of Clubs or Organizations: Not on file  . Attends Archivist Meetings: Not on file  . Marital Status: Not on file  Intimate Partner Violence:   . Fear of Current or Ex-Partner: Not on file  . Emotionally Abused: Not on file  . Physically Abused: Not on file  . Sexually Abused: Not on file   Review of Systems Not sleeping well RLS has been very bad lately Has been inconsistent with CPAP---goes out of room for wife's sake    Objective:   Physical Exam  Constitutional: He appears well-developed. No distress.  Respiratory: Effort normal. No respiratory distress.  Psychiatric: He has a normal mood and affect. His behavior is normal.           Assessment & Plan:

## 2019-03-26 NOTE — Assessment & Plan Note (Signed)
Having a terrible time sleeping Wife notes his symptoms are going into the day at times also So bad he can't sleep in bed with his wife--then when he leaves he can't use the CPAP Sinemet hasn't worked--will stop Try clonazepam instead

## 2019-04-19 ENCOUNTER — Other Ambulatory Visit: Payer: Self-pay | Admitting: Internal Medicine

## 2019-05-17 ENCOUNTER — Other Ambulatory Visit: Payer: Self-pay | Admitting: Internal Medicine

## 2019-05-18 NOTE — Telephone Encounter (Signed)
Last filled 03-26-19 #60 Last OV 03-26-19 Next OV 06-18-19 [address]

## 2019-05-19 ENCOUNTER — Ambulatory Visit: Payer: 59 | Admitting: Internal Medicine

## 2019-05-28 LAB — HM DIABETES EYE EXAM

## 2019-06-14 ENCOUNTER — Other Ambulatory Visit: Payer: Self-pay | Admitting: Internal Medicine

## 2019-06-17 ENCOUNTER — Other Ambulatory Visit: Payer: Self-pay

## 2019-06-18 ENCOUNTER — Ambulatory Visit: Payer: 59 | Admitting: Internal Medicine

## 2019-06-18 ENCOUNTER — Encounter: Payer: Self-pay | Admitting: Internal Medicine

## 2019-06-18 VITALS — BP 122/80 | HR 82 | Temp 97.2°F | Ht 69.0 in | Wt 199.2 lb

## 2019-06-18 DIAGNOSIS — E1159 Type 2 diabetes mellitus with other circulatory complications: Secondary | ICD-10-CM

## 2019-06-18 DIAGNOSIS — I2510 Atherosclerotic heart disease of native coronary artery without angina pectoris: Secondary | ICD-10-CM

## 2019-06-18 DIAGNOSIS — G2581 Restless legs syndrome: Secondary | ICD-10-CM

## 2019-06-18 DIAGNOSIS — Z794 Long term (current) use of insulin: Secondary | ICD-10-CM

## 2019-06-18 DIAGNOSIS — F419 Anxiety disorder, unspecified: Secondary | ICD-10-CM

## 2019-06-18 LAB — POCT GLYCOSYLATED HEMOGLOBIN (HGB A1C): Hemoglobin A1C: 7.3 % — AB (ref 4.0–5.6)

## 2019-06-18 MED ORDER — INSULIN GLARGINE 100 UNIT/ML ~~LOC~~ SOLN: 35.0000 [IU] | Freq: Two times a day (BID) | SUBCUTANEOUS | 0 refills | Status: DC

## 2019-06-18 NOTE — Progress Notes (Signed)
Subjective:    Patient ID: [name] [name], male    DOB: Nov 19, 1970, 49 y.o.   MRN: [id number]  HPI Here for follow up of diabetes out of control This visit occurred during the SARS-CoV-2 public health emergency.  Safety protocols were in place, including screening questions prior to the visit, additional usage of staff PPE, and extensive cleaning of exam room while observing appropriate contact time as indicated for disinfecting solutions.   Has cut his carbs way down Exercising more Did increase the insulin to 34 bid (from 25 bid) Has had some hypoglycemic reactions at night---after he had worked out  No chest pain No SOB No dizziness or syncope  Anxiety still well controlled on the sertraline This has made a "huge difference"  Current Outpatient Medications on File Prior to Visit  Medication Sig Dispense Refill  . acetaminophen (TYLENOL) 500 MG tablet Take 2 tablets (1,000 mg total) by mouth every 6 (six) hours as needed. 30 tablet 0  . aspirin 81 MG chewable tablet Chew 1 tablet (81 mg total) by mouth daily. 30 tablet 0  . clonazePAM (KLONOPIN) 0.5 MG tablet TAKE 1 TO 2 TABLETS BY MOUTH AT BEDTIME FOR  RESTLESS  LEGS 60 tablet 0  . glimepiride (AMARYL) 4 MG tablet Take 1 tablet (4 mg total) by mouth daily before breakfast. 90 tablet 3  . insulin glargine (LANTUS) 100 UNIT/ML injection Inject 0.25 mLs (25 Units total) into the skin 2 (two) times daily. (Patient taking differently: Inject 35 Units into the skin 2 (two) times daily. ) 10 mL 0  . lisinopril (ZESTRIL) 10 MG tablet Take 1 tablet by mouth once daily 90 tablet 3  . metFORMIN (GLUCOPHAGE) 1000 MG tablet TAKE 1 TABLET BY MOUTH TWICE DAILY WITH A MEAL 180 tablet 3  . metoprolol tartrate (LOPRESSOR) 25 MG tablet Take 1 tablet by mouth twice daily 180 tablet 1  . nitroGLYCERIN (NITROSTAT) 0.4 MG SL tablet Place 0.4 mg under the tongue every 5 (five) minutes as needed for chest pain.    Marland Kitchen rOPINIRole (REQUIP) 1 MG tablet TAKE 1  TABLET BY MOUTH THREE TIMES DAILY 270 tablet 3  . rosuvastatin (CRESTOR) 20 MG tablet Take 1 tablet by mouth once daily 90 tablet 2  . sertraline (ZOLOFT) 100 MG tablet Take 1 tablet by mouth once daily 90 tablet 0  . sildenafil (REVATIO) 20 MG tablet Take 20 mg by mouth. 3-5 as needed     No current facility-administered medications on file prior to visit.    Allergies  Allergen Reactions  . Simvastatin Other (See Comments)    Elevated LFTs?    Past Medical History:  Diagnosis Date  . Anxiety   . Coronary artery calcification    a. noted on CT angio 07/16/16  . Coronary artery disease   . High cholesterol   . HTN (hypertension)   . OSA on CPAP    "don't wear it that much" (07/19/2016)  . 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 Type II diabetes mellitus (HCC)     Past Surgical History:  Procedure Laterality Date  . CARDIAC CATHETERIZATION  07/19/2016  . CORONARY ARTERY BYPASS GRAFT N/A 07/26/2016   Procedure: CORONARY ARTERY BYPASS GRAFTING (CABG), ON PUMP, TIMES FIVE, USING BILATERAL INTERNAL MAMMARY ARTERIES AND ENDOSCOPICALLY HARVESTED RIGHT GREATER SAPHENOUS VEIN;  Surgeon: Purcell Nails, MD;  Location:  [place] OR;  Service: Open Heart Surgery;  Laterality: N/A;  LIMA to LAD RIMA to Acute Marginal SVG to PDA SVG to Diag 2 SVG to OM1  . LEFT HEART CATH AND CORONARY ANGIOGRAPHY N/A 07/19/2016   Procedure: Left Heart Cath and Coronary Angiography;  Surgeon: Jettie Booze, MD;  Location: [place] CV LAB;  Service: Cardiovascular;  Laterality: N/A;  . ORCHIOPEXY Right ~ 1980   "undescending testicle"  . TEE WITHOUT CARDIOVERSION N/A 07/26/2016   Procedure: TRANSESOPHAGEAL ECHOCARDIOGRAM (TEE);  Surgeon: Rexene Alberts, MD;  Location: [place];  Service: Open Heart Surgery;  Laterality: N/A;    Family History  Problem Relation Age of Onset  . Heart failure Mother   . Diabetes Mother   . CAD  Paternal Grandfather   . Heart attack Paternal Grandfather   . Heart attack Paternal Uncle   . Cancer Neg Hx     Social History   Socioeconomic History  . Marital status: Married    Spouse name: Not on file  . Number of children: 0  . Years of education: Not on file  . Highest education level: Not on file  Occupational History  . 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 Smoking status: Never Smoker  . Smokeless tobacco: Never Used  Substance and Sexual Activity  . Alcohol use: No  . Drug use: No  . Sexual activity: Yes  Other Topics Concern  . Not on file  Social History Narrative   Divorced then remarried 2015   2 stepchildren from prior marriage   2 stepsons from this marriage   Social Determinants of Health   Financial Resource Strain:   . Difficulty of Paying Living Expenses:   Food Insecurity:   . Worried About Charity fundraiser in the Last Year:   . Arboriculturist in the Last Year:   Transportation Needs:   . Film/video editor (Medical):   Marland Kitchen Lack of Transportation (Non-Medical):   Physical Activity:   . Days of Exercise per Week:   . Minutes of Exercise per Session:   Stress:   . Feeling of Stress :   Social Connections:   . Frequency of Communication with Friends and Family:   . Frequency of Social Gatherings with Friends and Family:   . Attends Religious Services:   . Active Member of Clubs or Organizations:   . Attends Archivist Meetings:   Marland Kitchen Marital Status:   Intimate Partner Violence:   . Fear of Current or Ex-Partner:   . Emotionally Abused:   Marland Kitchen Physically Abused:   . Sexually Abused:    Review of Systems Weight up about 10# Sleeping better now---RLS so much better since sugars better    Objective:   Physical Exam  Constitutional: He appears well-developed. No distress.  Neck: No thyromegaly present.  Cardiovascular: Normal rate, regular rhythm, normal heart sounds and intact distal pulses. Exam  reveals no gallop.  No murmur heard. Respiratory: Effort normal and breath sounds normal. No respiratory distress. He has no wheezes. He has no rales.  Musculoskeletal:        General: No edema.  Lymphadenopathy:    He has no cervical adenopathy.  Skin:  No foot lesions  Psychiatric: He has a normal mood and affect. His behavior is normal.           Assessment & Plan:

## 2019-06-18 NOTE — Assessment & Plan Note (Signed)
Doing well on the sertraline

## 2019-06-18 NOTE — Assessment & Plan Note (Signed)
Satisfied with the 2 medications Okay to try to cut back (especially the ropinirole)

## 2019-06-18 NOTE — Assessment & Plan Note (Signed)
Doing well since CABG Trying to be more active

## 2019-06-18 NOTE — Assessment & Plan Note (Signed)
Lab Results  Component Value Date   HGBA1C 7.3 (A) 06/18/2019   Much better Slight increase in insulin--but mostly due to lifestyle changes Continue metformin, glimepiride and current insulin

## 2019-07-06 ENCOUNTER — Other Ambulatory Visit: Payer: Self-pay | Admitting: Internal Medicine

## 2019-07-07 ENCOUNTER — Other Ambulatory Visit: Payer: Self-pay

## 2019-07-07 ENCOUNTER — Encounter: Payer: Self-pay | Admitting: Internal Medicine

## 2019-07-07 ENCOUNTER — Ambulatory Visit (INDEPENDENT_AMBULATORY_CARE_PROVIDER_SITE_OTHER): Payer: 59 | Admitting: Internal Medicine

## 2019-07-07 DIAGNOSIS — E1159 Type 2 diabetes mellitus with other circulatory complications: Secondary | ICD-10-CM

## 2019-07-07 DIAGNOSIS — Z794 Long term (current) use of insulin: Secondary | ICD-10-CM | POA: Diagnosis not present

## 2019-07-07 DIAGNOSIS — R55 Syncope and collapse: Secondary | ICD-10-CM | POA: Insufficient documentation

## 2019-07-07 DIAGNOSIS — R197 Diarrhea, unspecified: Secondary | ICD-10-CM | POA: Diagnosis not present

## 2019-07-07 NOTE — Assessment & Plan Note (Signed)
I doubt this was hypoglycemia Will look into continuous glucose monitor--see if he is eligible

## 2019-07-07 NOTE — Patient Instructions (Signed)
Please see if your insurance will cover Dex-Com or Freestyle Libre (continuous glucose monitors)

## 2019-07-07 NOTE — Assessment & Plan Note (Signed)
Likely self limited viral illness Better now

## 2019-07-07 NOTE — Assessment & Plan Note (Signed)
Classic faint while trying to stand to void in the middle of the night I doubt this was hypoglycemia--though possible Probably just vasovagal Okay to return to work without restrictions Discussed sitting at night when voiding

## 2019-07-07 NOTE — Progress Notes (Signed)
Subjective:    Patient ID: [name] [name], male    DOB: Jan 16, 1971, 49 y.o.   MRN: [id number]  HPI Here for review after diarrhea and a low sugar reaction This visit occurred during the SARS-CoV-2 public health emergency.  Safety protocols were in place, including screening questions prior to the visit, additional usage of staff PPE, and extensive cleaning of exam room while observing appropriate contact time as indicated for disinfecting solutions.   Awoke yesterday---"stomach bug" Had diarrhea and not hungry Didn't have much dinner Took his regular insulin dose Got up 3AM---went to bathroom. Was standing at commode and passed out Wife came and gave him some candy Had been 110-115 in the evening  No stools today No abdominal pain No N/V May not have kept up with fluids  Current Outpatient Medications on File Prior to Visit  Medication Sig Dispense Refill  . acetaminophen (TYLENOL) 500 MG tablet Take 2 tablets (1,000 mg total) by mouth every 6 (six) hours as needed. 30 tablet 0  . aspirin 81 MG chewable tablet Chew 1 tablet (81 mg total) by mouth daily. 30 tablet 0  . clonazePAM (KLONOPIN) 0.5 MG tablet TAKE 1 TO 2 TABLETS BY MOUTH AT BEDTIME FOR  RESTLESS  LEGS 60 tablet 0  . glimepiride (AMARYL) 4 MG tablet TAKE 1 TABLET BY MOUTH ONCE DAILY BEFORE BREAKFAST 90 tablet 3  . insulin glargine (LANTUS) 100 UNIT/ML injection Inject 0.35 mLs (35 Units total) into the skin 2 (two) times daily. 1 mL 0  . lisinopril (ZESTRIL) 10 MG tablet Take 1 tablet by mouth once daily 90 tablet 3  . metFORMIN (GLUCOPHAGE) 1000 MG tablet TAKE 1 TABLET BY MOUTH TWICE DAILY WITH A MEAL 180 tablet 3  . metoprolol tartrate (LOPRESSOR) 25 MG tablet Take 1 tablet by mouth twice daily 180 tablet 1  . nitroGLYCERIN (NITROSTAT) 0.4 MG SL tablet Place 0.4 mg under the tongue every 5 (five) minutes as needed for chest pain.    Marland Kitchen rOPINIRole (REQUIP) 1 MG tablet TAKE 1 TABLET BY MOUTH THREE TIMES DAILY 270 tablet 3  .  rosuvastatin (CRESTOR) 20 MG tablet Take 1 tablet by mouth once daily 90 tablet 2  . sertraline (ZOLOFT) 100 MG tablet Take 1 tablet by mouth once daily 90 tablet 0  . sildenafil (REVATIO) 20 MG tablet Take 20 mg by mouth. 3-5 as needed     No current facility-administered medications on file prior to visit.    Allergies  Allergen Reactions  . Simvastatin Other (See Comments)    Elevated LFTs?    Past Medical History:  Diagnosis Date  . Anxiety   . Coronary artery calcification    a. noted on CT angio 07/16/16  . Coronary artery disease   . High cholesterol   . HTN (hypertension)   . OSA on CPAP    "don't wear it that much" (07/19/2016)  . 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 Type II diabetes mellitus (HCC)     Past Surgical History:  Procedure Laterality Date  . CARDIAC CATHETERIZATION  07/19/2016  . CORONARY ARTERY BYPASS GRAFT N/A 07/26/2016   Procedure: CORONARY ARTERY BYPASS GRAFTING (CABG), ON PUMP, TIMES FIVE, USING BILATERAL INTERNAL MAMMARY ARTERIES AND ENDOSCOPICALLY HARVESTED RIGHT GREATER SAPHENOUS VEIN;  Surgeon: Purcell Nails, MD;  Location: [facility] OR;  Service: Open Heart Surgery;  Laterality: N/A;  LIMA to LAD RIMA to Acute Marginal SVG to PDA SVG to Diag 2 SVG to OM1  . LEFT HEART CATH AND CORONARY ANGIOGRAPHY N/A 07/19/2016   Procedure: Left Heart Cath and Coronary Angiography;  Surgeon: Jettie Booze, MD;  Location: [place] CV LAB;  Service: Cardiovascular;  Laterality: N/A;  . ORCHIOPEXY Right ~ 1980   "undescending testicle"  . TEE WITHOUT CARDIOVERSION N/A 07/26/2016   Procedure: TRANSESOPHAGEAL ECHOCARDIOGRAM (TEE);  Surgeon: Rexene Alberts, MD;  Location: [place];  Service: Open Heart Surgery;  Laterality: N/A;    Family History  Problem Relation Age of Onset  . Heart failure Mother   . Diabetes Mother   . CAD Paternal Grandfather   . Heart attack Paternal  Grandfather   . Heart attack Paternal Uncle   . Cancer Neg Hx     Social History   Socioeconomic History  . Marital status: Married    Spouse name: Not on file  . Number of children: 0  . Years of education: Not on file  . Highest education level: Not on file  Occupational History  . 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[employer]  Tobacco Use  . Smoking status: Never Smoker  . Smokeless tobacco: Never Used  Substance and Sexual Activity  . Alcohol use: No  . Drug use: No  . Sexual activity: Yes  Other Topics Concern  . Not on file  Social History Narrative   Divorced then remarried 2015   2 stepchildren from prior marriage   2 stepsons from this marriage   Social Determinants of Health   Financial Resource Strain:   . Difficulty of Paying Living Expenses:   Food Insecurity:   . Worried About Charity fundraiser in the Last Year:   . Arboriculturist in the Last Year:   Transportation Needs:   . Film/video editor (Medical):   Marland Kitchen Lack of Transportation (Non-Medical):   Physical Activity:   . Days of Exercise per Week:   . Minutes of Exercise per Session:   Stress:   . Feeling of Stress :   Social Connections:   . Frequency of Communication with Friends and Family:   . Frequency of Social Gatherings with Friends and Family:   . Attends Religious Services:   . Active Member of Clubs or Organizations:   . Attends Archivist Meetings:   Marland Kitchen Marital Status:   Intimate Partner Violence:   . Fear of Current or Ex-Partner:   . Emotionally Abused:   Marland Kitchen Physically Abused:   . Sexually Abused:    Review of Systems  No chest pain or SOB No dizziness Feels fine today Didn't sleep great last night     Objective:   Physical Exam  Constitutional: He is oriented to person, place, and time. He appears well-developed. No distress.  Neck: No thyromegaly present.  Cardiovascular: Normal rate, regular rhythm and normal heart sounds. Exam reveals no gallop.  No  murmur heard. Respiratory: Effort normal and breath sounds normal. No respiratory distress. He has no wheezes. He has no rales.  GI: Soft. There is no abdominal tenderness.  Musculoskeletal:        General: No edema.  Lymphadenopathy:    He has no cervical adenopathy.  Neurological: He is alert and oriented to person, place, and time.           Assessment & Plan:

## 2019-09-13 ENCOUNTER — Other Ambulatory Visit: Payer: Self-pay | Admitting: Internal Medicine

## 2019-09-20 ENCOUNTER — Other Ambulatory Visit: Payer: Self-pay | Admitting: Internal Medicine

## 2019-09-21 NOTE — Telephone Encounter (Signed)
Last filled 07-20-19 #60 Last OV 07-04-19 No Future OV [facility] [address]

## 2019-10-18 ENCOUNTER — Other Ambulatory Visit: Payer: Self-pay | Admitting: Internal Medicine

## 2019-11-15 ENCOUNTER — Other Ambulatory Visit: Payer: Self-pay | Admitting: Internal Medicine

## 2019-11-17 NOTE — Telephone Encounter (Signed)
Last filled 09-21-19 #60 Last OV 07-07-19 No Future OV [facility] [address]

## 2019-11-30 ENCOUNTER — Other Ambulatory Visit: Payer: Self-pay | Admitting: Internal Medicine

## 2019-11-30 NOTE — Telephone Encounter (Signed)
LVM for patient to call back and schedule

## 2019-11-30 NOTE — Telephone Encounter (Signed)
Please schedule overdue office visit. Thank you! 

## 2019-12-01 NOTE — Telephone Encounter (Signed)
Patient is scheduled for End on 10/28

## 2019-12-07 ENCOUNTER — Other Ambulatory Visit: Payer: Self-pay | Admitting: Internal Medicine

## 2019-12-27 ENCOUNTER — Other Ambulatory Visit: Payer: Self-pay | Admitting: Internal Medicine

## 2020-01-04 ENCOUNTER — Other Ambulatory Visit: Payer: Self-pay | Admitting: Internal Medicine

## 2020-01-07 ENCOUNTER — Other Ambulatory Visit: Payer: Self-pay

## 2020-01-07 ENCOUNTER — Ambulatory Visit (INDEPENDENT_AMBULATORY_CARE_PROVIDER_SITE_OTHER): Payer: 59 | Admitting: Internal Medicine

## 2020-01-07 ENCOUNTER — Encounter: Payer: Self-pay | Admitting: Internal Medicine

## 2020-01-07 VITALS — BP 136/96 | HR 76 | Ht 69.0 in | Wt 205.4 lb

## 2020-01-07 DIAGNOSIS — I2510 Atherosclerotic heart disease of native coronary artery without angina pectoris: Secondary | ICD-10-CM | POA: Diagnosis not present

## 2020-01-07 DIAGNOSIS — Z794 Long term (current) use of insulin: Secondary | ICD-10-CM

## 2020-01-07 DIAGNOSIS — E785 Hyperlipidemia, unspecified: Secondary | ICD-10-CM

## 2020-01-07 DIAGNOSIS — I10 Essential (primary) hypertension: Secondary | ICD-10-CM

## 2020-01-07 DIAGNOSIS — E1159 Type 2 diabetes mellitus with other circulatory complications: Secondary | ICD-10-CM | POA: Diagnosis not present

## 2020-01-07 MED ORDER — NITROGLYCERIN 0.4 MG SL SUBL: 0.4000 mg | SUBLINGUAL_TABLET | SUBLINGUAL | 99 refills | Status: AC | PRN

## 2020-01-07 NOTE — Progress Notes (Signed)
Follow-up Outpatient Visit Date: 01/07/2020  Primary Care Provider: Karie Schwalbe, MD [address] [place] [address]  Chief Complaint: Follow-up coronary artery disease  HPI:  Mr. Kinslow is a 49 y.o. male with history of  coronary artery disease status post CABG (07/2016), hypertension, type 2 diabetes mellitus, and anxiety, who presents for follow-up of coronary artery disease and hyperlipidemia.  I last saw Mr. Oetken in 10/2018, at which time he was doing fairly well.  No medication changes or additional testing were pursued at that time.  Today, Mr. Heikkila reports that he has been feeling very well.  He has put on a little bit of weight, which he thinks may be due to increased dose of insulin as well as being less active at his new role at work.  He has started going to the gym 3-4 times a week.  He denies chest pain, shortness of breath, palpitations, lightheadedness, and edema.  He is tolerating his medications well.  --------------------------------------------------------------------------------------------------  Cardiovascular History & Procedures: Cardiovascular Problems:  Coronary artery disease status post CABG for unstable angina (07/2016)  Risk Factors:  Known coronary artery disease, hypertension, diabetes mellitus, and male gender  Cath/PCI:  LHC (07/19/16): LMCA normal. LAD 90% proximal and 80% mid stenoses. D1 and D2 both with 90% ostial/proximal lesions. Dominant LCx. OM1 with 80% proximal stenosis. lPDA proximally occluded, with distal vessel filling via left-to-left and right-to-left collaterals. Small, non-dominant RCA with 80% mid/distal stenosis. LVEF 55-65%. LVEDP 7 mmHg.  CV Surgery:  CABG (07/26/16, Dr. Cornelius Moras): LIMA->LAD, RIMA->acute marginal of RCA, SVG->lPDA, SVG->OM1, and SVG->D2.  EP Procedures and Devices:  None  Non-Invasive Evaluation(s):  Exercise MPI(07/19/16): High risk studywith large in size, severe anterior/anteroseptal  reversible defect and 2 mm of ST depression in inferolateral leads. LVEF 42%.  Recent CV Pertinent Labs: Lab Results  Component Value Date   CHOL 127 11/12/2018   CHOL 141 09/17/2017   HDL 31.50 (L) 11/12/2018   HDL 36 (L) 09/17/2017   LDLCALC 64 11/12/2018   LDLCALC 83 09/17/2017   TRIG 155.0 (H) 11/12/2018   CHOLHDL 4 11/12/2018   INR 1.42 07/26/2016   K 4.8 11/12/2018   MG 2.0 07/27/2016   BUN 19 11/12/2018   BUN 30 (H) 02/28/2017   CREATININE 0.97 11/12/2018    Past medical and surgical history were reviewed and updated in EPIC.  Current Meds  Medication Sig  . acetaminophen (TYLENOL) 500 MG tablet Take 2 tablets (1,000 mg total) by mouth every 6 (six) hours as needed.  Marland Kitchen aspirin 81 MG chewable tablet Chew 1 tablet (81 mg total) by mouth daily.  . clonazePAM (KLONOPIN) 0.5 MG tablet TAKE 1 TO 2 TABLETS BY MOUTH AT BEDTIME FOR  RESTLESS  LEGS  . glimepiride (AMARYL) 4 MG tablet TAKE 1 TABLET BY MOUTH ONCE DAILY BEFORE BREAKFAST  . insulin glargine (LANTUS) 100 UNIT/ML injection Inject 0.35 mLs (35 Units total) into the skin 2 (two) times daily.  Marland Kitchen lisinopril (ZESTRIL) 10 MG tablet Take 1 tablet (10 mg total) by mouth daily. NO FURTHER REFILLS UNTIL SEEN IN CLINIC  . metFORMIN (GLUCOPHAGE) 1000 MG tablet TAKE 1 TABLET BY MOUTH TWICE DAILY WITH A MEAL  . metoprolol tartrate (LOPRESSOR) 25 MG tablet TAKE 1 TABLET BY MOUTH TWICE DAILY (MUST  KEEP  SCHEDULED  APPOINTMENT)  . nitroGLYCERIN (NITROSTAT) 0.4 MG SL tablet Place 0.4 mg under the tongue every 5 (five) minutes as needed for chest pain.  Marland Kitchen rOPINIRole (REQUIP) 1  MG tablet TAKE 1 TABLET BY MOUTH THREE TIMES DAILY  . rosuvastatin (CRESTOR) 20 MG tablet Take 1 tablet (20 mg total) by mouth daily. NO FURTHER REFILLS UNTIL SEEN IN CLINIC  . sertraline (ZOLOFT) 100 MG tablet Take 1 tablet by mouth once daily  . sildenafil (REVATIO) 20 MG tablet Take 20 mg by mouth. 3-5 as needed    Allergies: Simvastatin  Social History    Tobacco Use  . Smoking status: Never Smoker  . Smokeless tobacco: Never Used  Vaping Use  . Vaping Use: Never used  Substance Use Topics  . Alcohol use: No  . Drug use: No    Family History  Problem Relation Age of Onset  . Heart failure Mother   . Diabetes Mother   . CAD Paternal Grandfather   . Heart attack Paternal Grandfather   . Heart attack Paternal Uncle   . Cancer Neg Hx     Review of Systems: A 12-system review of systems was performed and was negative except as noted in the HPI.  --------------------------------------------------------------------------------------------------  Physical Exam: BP (!) 136/96   Pulse 76   Ht 5\' 9"  (1.753 m)   Wt 205 lb 6.4 oz (93.2 kg)   BMI 30.33 kg/m   General: NAD. Neck: No JVD or HJR. Lungs: Clear to auscultation bilaterally without wheezes or crackles. Heart: Regular rate and rhythm without murmurs, rubs, or gallops. Abdomen: Soft, nontender, nondistended. Extremities: No lower extremity edema.  EKG: Normal sinus rhythm without abnormality.  Lab Results  Component Value Date   WBC 6.4 11/12/2018   HGB 14.3 11/12/2018   HCT 41.8 11/12/2018   MCV 95.2 11/12/2018   PLT 252.0 11/12/2018    Lab Results  Component Value Date   NA 136 11/12/2018   K 4.8 11/12/2018   CL 102 11/12/2018   CO2 29 11/12/2018   BUN 19 11/12/2018   CREATININE 0.97 11/12/2018   GLUCOSE 186 (H) 11/12/2018   ALT 34 11/12/2018    Lab Results  Component Value Date   CHOL 127 11/12/2018   HDL 31.50 (L) 11/12/2018   LDLCALC 64 11/12/2018   TRIG 155.0 (H) 11/12/2018   CHOLHDL 4 11/12/2018    --------------------------------------------------------------------------------------------------  ASSESSMENT AND PLAN: Coronary artery disease: Mr. Kamer continues to do well without recurrent angina following his CABG in 2018.  We will continue his current regimen for secondary prevention, including aspirin, metoprolol, and  rosuvastatin.  Hypertension: Blood pressure mildly elevated today (goal less than 130/80).  Blood pressures have typically been under better control.  Question if weight gain and sodium intake may be contributing.  We have agreed to defer medication changes.  I have encouraged Mr. Whedbee to limit his sodium intake and to work on weight loss through diet and exercise.  Hyperlipidemia: LDL at goal on last check a year ago.  Mr. Culpepper is due for follow-up with Dr. Manson Passey and wishes to get his blood work drawn with him.  We will plan to continue current dose of rosuvastatin for target LDL less than 70.  Type 2 diabetes mellitus: Hemoglobin A1c much improved on last check in April at 7.3.  Mr. Mittag should continue his current medications and arrange for follow-up with Dr. Manson Passey at his earliest convenience.  Follow-up: Return to clinic in 1 year.  Alphonsus Sias, MD 01/07/2020 8:38 AM

## 2020-01-07 NOTE — Patient Instructions (Signed)
Medication Instructions:  Your physician recommends that you continue on your current medications as directed. Please refer to the Current Medication list given to you today.  *If you need a refill on your cardiac medications before your next appointment, please call your pharmacy*  Follow-Up: At [facility], you and your health needs are our priority.  As part of our continuing mission to provide you with exceptional heart care, we have created designated Provider Care Teams.  These Care Teams include your primary Cardiologist (physician) and Advanced Practice Providers (APPs -  Physician Assistants and Nurse Practitioners) who all work together to provide you with the care you need, when you need it.  We recommend signing up for the patient portal called "MyChart".  Sign up information is provided on this After Visit Summary.  MyChart is used to connect with patients for Virtual Visits (Telemedicine).  Patients are able to view lab/test results, encounter notes, upcoming appointments, etc.  Non-urgent messages can be sent to your provider as well.   To learn more about what you can do with MyChart, go to [email or web address].com.    Your next appointment:   12 month(s)  The format for your next appointment:   In Person  Provider:   You may see Christopher End, MD or one of the following Advanced Practice Providers on your designated Care Team:    Christopher Berge, NP  Ryan Dunn, PA-C  Jacquelyn Visser, PA-C  Cadence Furth, PA-C  

## 2020-01-18 ENCOUNTER — Other Ambulatory Visit: Payer: Self-pay | Admitting: Internal Medicine

## 2020-01-24 ENCOUNTER — Other Ambulatory Visit: Payer: Self-pay | Admitting: Internal Medicine

## 2020-02-14 ENCOUNTER — Other Ambulatory Visit: Payer: Self-pay | Admitting: Internal Medicine

## 2020-02-21 ENCOUNTER — Other Ambulatory Visit: Payer: Self-pay | Admitting: Internal Medicine

## 2020-03-16 ENCOUNTER — Other Ambulatory Visit: Payer: Self-pay | Admitting: Internal Medicine

## 2020-03-16 ENCOUNTER — Telehealth: Payer: Self-pay | Admitting: Internal Medicine

## 2020-03-16 NOTE — Telephone Encounter (Signed)
PA is needed for his insulin and he is using [facility] in [place]

## 2020-03-16 NOTE — Telephone Encounter (Signed)
Pt called in wanted to about getting a script for his lantus for [facility] due to [facility] last did the refill and doesn't have a script on file.

## 2020-03-17 NOTE — Telephone Encounter (Signed)
Phone # for prior authorizations is  1  [phone]

## 2020-03-17 NOTE — Telephone Encounter (Signed)
Patient called in stating prior authorization sent to the pharmacy due to his insurance and coverage. Please advise patient EM

## 2020-03-18 NOTE — Telephone Encounter (Signed)
[facility], [employer], and spoke to [place] and started [place]. PA was denied. Preferred medication is Semglee. Since pt has used insulin glargine in the past the PA was denied.  Advised pt the PA was denied. He reports he has enough insulin for today and tomorrow. Advised there is a virtual clinic tomorrow and that a msg would be sent to one of the providers at that clinic to see if they would send in the insulin since PCP is out of the office until Tues. Pt reports he currently uses Lantus vials and would like to continue using vials. Semglee comes in vials or pens. Advised this would be sent to the provider. Advised to f/u with [facility] pharmacy tomorrow around noon to make sure they have the script and if any problems contact the office. Advised a msg would also be sent to the CMA working tomorrow to check on the script. Pt appreciative and verbalized understanding.

## 2020-03-18 NOTE — Telephone Encounter (Signed)
This was documented in the telephone encounter on 03/18/20.

## 2020-03-19 MED ORDER — INSULIN GLARGINE-YFGN 100 UNIT/ML ~~LOC~~ SOLN: 25.0000 [IU] | Freq: Two times a day (BID) | SUBCUTANEOUS | 0 refills | Status: DC

## 2020-03-19 NOTE — Telephone Encounter (Signed)
RX for [employer] sent to pharmacy

## 2020-03-19 NOTE — Telephone Encounter (Signed)
Called and left message on v/m as well as sent my chart to let pt know at pharmacy

## 2020-03-29 ENCOUNTER — Other Ambulatory Visit: Payer: Self-pay | Admitting: Internal Medicine

## 2020-04-01 ENCOUNTER — Other Ambulatory Visit: Payer: Self-pay | Admitting: Internal Medicine

## 2020-04-05 ENCOUNTER — Other Ambulatory Visit: Payer: Self-pay | Admitting: Internal Medicine

## 2020-04-19 ENCOUNTER — Ambulatory Visit: Payer: 59 | Admitting: Internal Medicine

## 2020-04-19 DIAGNOSIS — Z0289 Encounter for other administrative examinations: Secondary | ICD-10-CM

## 2020-04-26 ENCOUNTER — Ambulatory Visit (INDEPENDENT_AMBULATORY_CARE_PROVIDER_SITE_OTHER): Payer: No Typology Code available for payment source | Admitting: Internal Medicine

## 2020-04-26 ENCOUNTER — Encounter: Payer: Self-pay | Admitting: Internal Medicine

## 2020-04-26 ENCOUNTER — Other Ambulatory Visit: Payer: Self-pay

## 2020-04-26 VITALS — BP 136/88 | HR 83 | Temp 98.1°F | Ht 69.0 in | Wt 200.0 lb

## 2020-04-26 DIAGNOSIS — Z794 Long term (current) use of insulin: Secondary | ICD-10-CM

## 2020-04-26 DIAGNOSIS — E1159 Type 2 diabetes mellitus with other circulatory complications: Secondary | ICD-10-CM

## 2020-04-26 DIAGNOSIS — I2510 Atherosclerotic heart disease of native coronary artery without angina pectoris: Secondary | ICD-10-CM

## 2020-04-26 DIAGNOSIS — G2581 Restless legs syndrome: Secondary | ICD-10-CM | POA: Diagnosis not present

## 2020-04-26 LAB — POCT GLYCOSYLATED HEMOGLOBIN (HGB A1C): Hemoglobin A1C: 9.1 % — AB (ref 4.0–5.6)

## 2020-04-26 MED ORDER — DEXCOM G6 SENSOR MISC: 1.0000 | 5 refills | Status: DC

## 2020-04-26 MED ORDER — ROPINIROLE HCL 1 MG PO TABS: 2.0000 mg | ORAL_TABLET | Freq: Every day | ORAL | 0 refills | Status: DC

## 2020-04-26 MED ORDER — DEXCOM G6 TRANSMITTER MISC: 1.0000 | 3 refills | Status: DC

## 2020-04-26 MED ORDER — DEXCOM G6 TRANSMITTER MISC: 1.0000 | 5 refills | Status: DC

## 2020-04-26 MED ORDER — DEXCOM G6 SENSOR MISC: 3 refills | Status: DC

## 2020-04-26 MED ORDER — CLONAZEPAM 0.5 MG PO TABS: ORAL_TABLET | ORAL | 0 refills | Status: DC

## 2020-04-26 MED ORDER — DEXCOM G6 RECEIVER DEVI: 1.0000 | 0 refills | Status: DC

## 2020-04-26 MED ORDER — DAPAGLIFLOZIN PROPANEDIOL 5 MG PO TABS: 5.0000 mg | ORAL_TABLET | Freq: Every day | ORAL | 3 refills | Status: DC

## 2020-04-26 NOTE — Assessment & Plan Note (Signed)
Lab Results  Component Value Date   HGBA1C 9.1 (A) 04/26/2020   Out of control Mostly dietary though Will start farxiga Rx for dexcom so he can figure out what is going on

## 2020-04-26 NOTE — Assessment & Plan Note (Signed)
Doing okay on statin, ASA, metoprolol and lisinopril Will add farxiga

## 2020-04-26 NOTE — Assessment & Plan Note (Signed)
Using the clonazepam and ropinirole

## 2020-04-26 NOTE — Progress Notes (Signed)
Subjective:    Patient ID: [name] [name], male    DOB: 21-Jan-1971, 50 y.o.   MRN: [id number]  HPI Here for follow up of diabetes and other chronic health conditions This visit occurred during the SARS-CoV-2 public health emergency.  Safety protocols were in place, including screening questions prior to the visit, additional usage of staff PPE, and extensive cleaning of exam room while observing appropriate contact time as indicated for disinfecting solutions.   Knows his sugars have been some higher Still surprised by increase in A1c Now working in [employer] and not as physical Has to "reel in" his eating Checks about once a week---125 in the afternoon. No recent AM checks  Does go to the gym 3-4 times a week No foot burning, sores, numbness  Mood has been okay Continues on the sertraline--this controls his anxiety  Current Outpatient Medications on File Prior to Visit  Medication Sig Dispense Refill  . acetaminophen (TYLENOL) 500 MG tablet Take 2 tablets (1,000 mg total) by mouth every 6 (six) hours as needed. 30 tablet 0  . aspirin 81 MG chewable tablet Chew 1 tablet (81 mg total) by mouth daily. 30 tablet 0  . clonazePAM (KLONOPIN) 0.5 MG tablet TAKE 1 TO 2 TABLETS BY MOUTH AT BEDTIME AS NEEDED FOR  RESTLESS  LEGS 60 tablet 0  . glimepiride (AMARYL) 4 MG tablet TAKE 1 TABLET BY MOUTH DAILY BEFORE BREAKFAST 90 tablet 0  . Insulin Glargine-yfgn (SEMGLEE, YFGN,) 100 UNIT/ML SOLN Inject 35 Units into the skin in the morning and at bedtime. 20 mL 12  . lisinopril (ZESTRIL) 10 MG tablet Take 1 tablet (10 mg total) by mouth daily. 30 tablet 11  . metFORMIN (GLUCOPHAGE) 1000 MG tablet TAKE 1 TABLET BY MOUTH TWICE DAILY WITH A MEAL 180 tablet 0  . metoprolol tartrate (LOPRESSOR) 25 MG tablet TAKE 1 TABLET BY MOUTH TWICE DAILY (MUST  KEEP  SCHEDULED  APPOINTMENT) 60 tablet 10  . nitroGLYCERIN (NITROSTAT) 0.4 MG SL tablet Place 1 tablet (0.4 mg total) under the tongue every 5  (five) minutes as needed for chest pain. 25 tablet prn  . rOPINIRole (REQUIP) 1 MG tablet TAKE 1 TABLET BY MOUTH THREE TIMES DAILY 270 tablet 0  . rosuvastatin (CRESTOR) 20 MG tablet Take 1 tablet (20 mg total) by mouth daily. 30 tablet 11  . sertraline (ZOLOFT) 100 MG tablet Take 1 tablet by mouth once daily 90 tablet 3   No current facility-administered medications on file prior to visit.    Allergies  Allergen Reactions  . Simvastatin Other (See Comments)    Elevated LFTs?    Past Medical History:  Diagnosis Date  . Anxiety   . Coronary artery calcification    a. noted on CT angio 07/16/16  . Coronary artery disease   . High cholesterol   . HTN (hypertension)   . OSA on CPAP    "don't wear it that much" (07/19/2016)  . 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 Type II diabetes mellitus (HCC)     Past Surgical History:  Procedure Laterality Date  . CARDIAC CATHETERIZATION  07/19/2016  . CORONARY ARTERY BYPASS GRAFT N/A 07/26/2016   Procedure: CORONARY ARTERY BYPASS GRAFTING (CABG), ON PUMP, TIMES FIVE, USING BILATERAL INTERNAL MAMMARY ARTERIES AND ENDOSCOPICALLY HARVESTED RIGHT GREATER SAPHENOUS VEIN;  Surgeon: Purcell Nails, MD;  Location: [facility]  OR;  Service: Open Heart Surgery;  Laterality: N/A;  LIMA to LAD RIMA to Acute Marginal SVG to PDA SVG to Diag 2 SVG to OM1  . LEFT HEART CATH AND CORONARY ANGIOGRAPHY N/A 07/19/2016   Procedure: Left Heart Cath and Coronary Angiography;  Surgeon: Corky Crafts, MD;  Location: [facility] INVASIVE CV LAB;  Service: Cardiovascular;  Laterality: N/A;  . ORCHIOPEXY Right ~ 1980   "undescending testicle"  . TEE WITHOUT CARDIOVERSION N/A 07/26/2016   Procedure: TRANSESOPHAGEAL ECHOCARDIOGRAM (TEE);  Surgeon: Purcell Nails, MD;  Location: [facility] OR;  Service: Open Heart Surgery;  Laterality: N/A;    Family History  Problem Relation Age of Onset  . Heart failure  Mother   . Diabetes Mother   . CAD Paternal Grandfather   . Heart attack Paternal Grandfather   . Heart attack Paternal Uncle   . Cancer Neg Hx     Social History   Socioeconomic History  . Marital status: Married    Spouse name: Not on file  . Number of children: 0  . Years of education: Not on file  . Highest education level: Not on file  Occupational History  . 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[employer]  Tobacco Use  . Smoking status: Never Smoker  . Smokeless tobacco: Never Used  Vaping Use  . Vaping Use: Never used  Substance and Sexual Activity  . Alcohol use: No  . Drug use: No  . Sexual activity: Yes  Other Topics Concern  . Not on file  Social History Narrative   Divorced then remarried 2015   2 stepchildren from prior marriage   2 stepsons from this marriage   Social Determinants of Health   Financial Resource Strain: Not on file  Food Insecurity: Not on file  Transportation Needs: Not on file  Physical Activity: Not on file  Stress: Not on file  Social Connections: Not on file  Intimate Partner Violence: Not on file   Review of Systems Weight is up from last year---but starting to get it down with restarting at the gym Sleeps "so-so". Tried off the clonazepam--tries to limit to one.    Objective:   Physical Exam Constitutional:      Appearance: Normal appearance.  Cardiovascular:     Rate and Rhythm: Normal rate and regular rhythm.     Pulses: Normal pulses.     Heart sounds: No murmur heard. No gallop.   Pulmonary:     Effort: Pulmonary effort is normal.     Breath sounds: Normal breath sounds. No wheezing or rales.  Musculoskeletal:     Cervical back: Neck supple.     Right lower leg: No edema.     Left lower leg: No edema.  Lymphadenopathy:     Cervical: No cervical adenopathy.  Skin:    Comments: No foot lesions  Neurological:     Mental Status: He is alert.     Comments: Normal sensation in feet  Psychiatric:        Mood and  Affect: Mood normal.        Behavior: Behavior normal.            Assessment & Plan:

## 2020-05-16 MED ORDER — METFORMIN HCL 1000 MG PO TABS: 1000.0000 mg | ORAL_TABLET | Freq: Two times a day (BID) | ORAL | 0 refills | Status: DC

## 2020-05-16 NOTE — Telephone Encounter (Signed)
Patient called in stating he is needing to have his medication refilled as he is completely out. Please advise.

## 2020-05-23 ENCOUNTER — Telehealth: Payer: Self-pay

## 2020-05-23 MED ORDER — ROPINIROLE HCL 1 MG PO TABS: 2.0000 mg | ORAL_TABLET | Freq: Every day | ORAL | 3 refills | Status: DC

## 2020-05-23 NOTE — Telephone Encounter (Signed)
Rx sent electronically.  

## 2020-06-07 ENCOUNTER — Other Ambulatory Visit: Payer: Self-pay | Admitting: Internal Medicine

## 2020-07-25 ENCOUNTER — Other Ambulatory Visit: Payer: Self-pay | Admitting: Internal Medicine

## 2020-07-25 NOTE — Telephone Encounter (Signed)
Note from pharmacy states:  To pharmacy: PT TAKES UP TO 4 PER DAY. PLEASE SEND UPDATED RX IF THIS IS CORRECT. THANKS!    Last ordered: 2 months ago by Karie Schwalbe, MD Last refill: 05/23/2020   He has an appt 07-29-20. Last OV note does not state that he takes 4 a day.

## 2020-07-25 NOTE — Telephone Encounter (Signed)
They make 2 and 4mg  sizes--wouldn't it be best to give him a higher dosage (unless he varies it day to day) Okay to refill for a year when the best dose size is decided

## 2020-07-26 NOTE — Telephone Encounter (Signed)
Sent a MyChart message to pt to see what he would like to do.

## 2020-07-28 NOTE — Telephone Encounter (Signed)
Pt sent a message back that he would like to try the 4mg .

## 2020-07-29 ENCOUNTER — Encounter: Payer: No Typology Code available for payment source | Admitting: Internal Medicine

## 2020-08-04 ENCOUNTER — Other Ambulatory Visit: Payer: Self-pay

## 2020-08-04 ENCOUNTER — Encounter: Payer: Self-pay | Admitting: Internal Medicine

## 2020-08-04 ENCOUNTER — Ambulatory Visit (INDEPENDENT_AMBULATORY_CARE_PROVIDER_SITE_OTHER): Payer: No Typology Code available for payment source | Admitting: Internal Medicine

## 2020-08-04 VITALS — BP 120/78 | HR 70 | Temp 97.5°F | Ht 68.5 in | Wt 192.0 lb

## 2020-08-04 DIAGNOSIS — Z0000 Encounter for general adult medical examination without abnormal findings: Secondary | ICD-10-CM

## 2020-08-04 DIAGNOSIS — I2510 Atherosclerotic heart disease of native coronary artery without angina pectoris: Secondary | ICD-10-CM

## 2020-08-04 DIAGNOSIS — Z794 Long term (current) use of insulin: Secondary | ICD-10-CM | POA: Diagnosis not present

## 2020-08-04 DIAGNOSIS — G2581 Restless legs syndrome: Secondary | ICD-10-CM | POA: Diagnosis not present

## 2020-08-04 DIAGNOSIS — L570 Actinic keratosis: Secondary | ICD-10-CM | POA: Diagnosis not present

## 2020-08-04 DIAGNOSIS — E1159 Type 2 diabetes mellitus with other circulatory complications: Secondary | ICD-10-CM

## 2020-08-04 DIAGNOSIS — Z1211 Encounter for screening for malignant neoplasm of colon: Secondary | ICD-10-CM

## 2020-08-04 LAB — HM DIABETES FOOT EXAM

## 2020-08-04 NOTE — Assessment & Plan Note (Signed)
No symptoms now Continue current regimen

## 2020-08-04 NOTE — Progress Notes (Signed)
Subjective:    Patient ID: [name] [name], male    DOB: 12/09/1970, 50 y.o.   MRN: [id number]  HPI Here for physical This visit occurred during the SARS-CoV-2 public health emergency.  Safety protocols were in place, including screening questions prior to the visit, additional usage of staff PPE, and extensive cleaning of exam room while observing appropriate contact time as indicated for disinfecting solutions.   Loves the Dexcom---"it has been amazing" Checks sugars frequently----uses the alarms Average 121 over time---estimated A1c down to 6.9% Weight down 8# Same insulin for now---35 bid Going to gym more! No foot numbness, tingling or burning  Till with restless legs---seems to be in his thighs Now taking the 4mg  at bedtime No sig daytime somnolence  Current Outpatient Medications on File Prior to Visit  Medication Sig Dispense Refill  . acetaminophen (TYLENOL) 500 MG tablet Take 2 tablets (1,000 mg total) by mouth every 6 (six) hours as needed. 30 tablet 0  . aspirin 81 MG chewable tablet Chew 1 tablet (81 mg total) by mouth daily. 30 tablet 0  . clonazePAM (KLONOPIN) 0.5 MG tablet TAKE 1 TO 2 TABLETS BY MOUTH AT BEDTIME AS NEEDED FOR  RESTLESS  LEGS 60 tablet 0  . Continuous Blood Gluc Receiver (DEXCOM G6 RECEIVER) DEVI 1 Device by Does not apply route continuous. 1 each 0  . Continuous Blood Gluc Sensor (DEXCOM G6 SENSOR) MISC Apply 1 sensor every 10 days 9 each 3  . Continuous Blood Gluc Transmit (DEXCOM G6 TRANSMITTER) MISC 1 Device by Does not apply route every 3 (three) months. 1 each 3  . dapagliflozin propanediol (FARXIGA) 5 MG TABS tablet Take 1 tablet (5 mg total) by mouth daily before breakfast. 90 tablet 3  . glimepiride (AMARYL) 4 MG tablet TAKE 1 TABLET BY MOUTH DAILY BEFORE BREAKFAST 90 tablet 0  . Insulin Glargine-yfgn (SEMGLEE, YFGN,) 100 UNIT/ML SOLN Inject 35 Units into the skin in the morning and at bedtime. 20 mL 12  . lisinopril (ZESTRIL) 10 MG tablet Take 1  tablet (10 mg total) by mouth daily. 30 tablet 11  . metFORMIN (GLUCOPHAGE) 1000 MG tablet Take 1 tablet (1,000 mg total) by mouth 2 (two) times daily with a meal. 180 tablet 0  . metoprolol tartrate (LOPRESSOR) 25 MG tablet TAKE 1 TABLET BY MOUTH TWICE DAILY (MUST  KEEP  SCHEDULED  APPOINTMENT) 60 tablet 10  . nitroGLYCERIN (NITROSTAT) 0.4 MG SL tablet Place 1 tablet (0.4 mg total) under the tongue every 5 (five) minutes as needed for chest pain. 25 tablet prn  . rOPINIRole (REQUIP) 4 MG tablet Take 1 tablet (4 mg total) by mouth at bedtime. 90 tablet 3  . rosuvastatin (CRESTOR) 20 MG tablet Take 1 tablet (20 mg total) by mouth daily. 30 tablet 11  . sertraline (ZOLOFT) 100 MG tablet TAKE 1 TABLET BY MOUTH DAILY 90 tablet 3   No current facility-administered medications on file prior to visit.    Allergies  Allergen Reactions  . Simvastatin Other (See Comments)    Elevated LFTs?    Past Medical History:  Diagnosis Date  . Anxiety   . Coronary artery calcification    a. noted on CT angio 07/16/16  . Coronary artery disease   . High cholesterol   . HTN (hypertension)   . OSA on CPAP    "don't wear it that much" (07/19/2016)  . 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 Type II diabetes mellitus (HCC)     Past Surgical History:  Procedure Laterality Date  . CARDIAC CATHETERIZATION  07/19/2016  . CORONARY ARTERY BYPASS GRAFT N/A 07/26/2016   Procedure: CORONARY ARTERY BYPASS GRAFTING (CABG), ON PUMP, TIMES FIVE, USING BILATERAL INTERNAL MAMMARY ARTERIES AND ENDOSCOPICALLY HARVESTED RIGHT GREATER SAPHENOUS VEIN;  Surgeon: Purcell Nails, MD;  Location: [facility] OR;  Service: Open Heart Surgery;  Laterality: N/A;  LIMA to LAD RIMA to Acute Marginal SVG to PDA SVG to Diag 2 SVG to OM1  . LEFT HEART CATH AND CORONARY ANGIOGRAPHY N/A 07/19/2016   Procedure: Left Heart Cath and Coronary Angiography;  Surgeon: Corky Crafts, MD;  Location: [facility] INVASIVE CV LAB;  Service: Cardiovascular;  Laterality: N/A;  . ORCHIOPEXY Right ~ 1980   "undescending testicle"  . TEE WITHOUT CARDIOVERSION N/A 07/26/2016   Procedure: TRANSESOPHAGEAL ECHOCARDIOGRAM (TEE);  Surgeon: Purcell Nails, MD;  Location: [facility] OR;  Service: Open Heart Surgery;  Laterality: N/A;    Family History  Problem Relation Age of Onset  . Heart failure Mother   . Diabetes Mother   . CAD Paternal Grandfather   . Heart attack Paternal Grandfather   . Heart attack Paternal Uncle   . Cancer Neg Hx     Social History   Socioeconomic History  . Marital status: Married    Spouse name: Not on file  . Number of children: 0  . Years of education: Not on file  . Highest education level: Not on file  Occupational History  . 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[employer]  Tobacco Use  . Smoking status: Never Smoker  . Smokeless tobacco: Never Used  Vaping Use  . Vaping Use: Never used  Substance and Sexual Activity  . Alcohol use: No  . Drug use: No  . Sexual activity: Yes  Other Topics Concern  . Not on file  Social History Narrative   Divorced then remarried 2015   2 stepchildren from prior marriage   2 stepsons from this marriage   Social Determinants of Health   Financial Resource Strain: Not on file  Food Insecurity: Not on file  Transportation Needs: Not on file  Physical Activity: Not on file  Stress: Not on file  Social Connections: Not on file  Intimate Partner Violence: Not on file   Review of Systems  Constitutional: Negative for fatigue.       Lost 8# Wears seat belt  HENT: Negative for dental problem, hearing loss and tinnitus.        Needs dental appt  Eyes: Negative for visual disturbance.       No diplopia or unilateral vision loss Overdue for his exam  Respiratory: Negative for cough, chest tightness and shortness of breath.   Cardiovascular: Negative for chest pain, palpitations and leg swelling.   Gastrointestinal: Negative for abdominal pain, blood in stool and constipation.       No heartburn  Endocrine: Negative for polydipsia and polyuria.  Genitourinary: Positive for frequency. Negative for difficulty urinating and urgency.       No sexual problems  Musculoskeletal: Negative for arthralgias, back pain and joint swelling.  Skin: Negative for rash.       No suspicious lesions  Allergic/Immunologic: Negative for environmental allergies and immunocompromised state.  Neurological: Negative for dizziness, syncope, light-headedness and headaches.  Hematological: Negative for adenopathy. Does not bruise/bleed easily.  Psychiatric/Behavioral: Positive for  sleep disturbance. Negative for dysphoric mood.       Anxiety controlled with the medication       Objective:   Physical Exam Constitutional:      Appearance: Normal appearance.  HENT:     Right Ear: Tympanic membrane and ear canal normal.     Left Ear: Tympanic membrane and ear canal normal.     Mouth/Throat:     Pharynx: No oropharyngeal exudate or posterior oropharyngeal erythema.  Eyes:     Conjunctiva/sclera: Conjunctivae normal.     Pupils: Pupils are equal, round, and reactive to light.  Cardiovascular:     Rate and Rhythm: Normal rate and regular rhythm.     Pulses: Normal pulses.     Heart sounds: No murmur heard. No gallop.   Pulmonary:     Effort: Pulmonary effort is normal.     Breath sounds: Normal breath sounds. No wheezing or rales.  Abdominal:     Palpations: Abdomen is soft.     Tenderness: There is no abdominal tenderness.  Musculoskeletal:     Cervical back: Neck supple.     Right lower leg: No edema.     Left lower leg: No edema.  Lymphadenopathy:     Cervical: No cervical adenopathy.  Skin:    Findings: No rash.     Comments: 2-73mm actinic on right forearm No foot lesions  Neurological:     General: No focal deficit present.     Mental Status: He is alert and oriented to person, place, and  time.  Psychiatric:        Mood and Affect: Mood normal.        Behavior: Behavior normal.            Assessment & Plan:

## 2020-08-04 NOTE — Assessment & Plan Note (Signed)
Doing well Had 2nd COVID vaccine Flu vaccine if he agrees in the fall Will do FIT No prostate cancer screening till 20 Exercising more

## 2020-08-04 NOTE — Assessment & Plan Note (Signed)
Controlled with ropinirole

## 2020-08-04 NOTE — Assessment & Plan Note (Signed)
Seems to have markedly better control with the Dexcom May need to reduce evening lantus

## 2020-08-04 NOTE — Assessment & Plan Note (Signed)
Discussed options for Rx and he gave verbal consent Liquid nitrogen 30 seconds x 2  Tolerated well Discussed home care

## 2020-08-05 LAB — COMPREHENSIVE METABOLIC PANEL
ALT: 28 U/L (ref 0–53)
AST: 24 U/L (ref 0–37)
Albumin: 4.6 g/dL (ref 3.5–5.2)
Alkaline Phosphatase: 60 U/L (ref 39–117)
BUN: 23 mg/dL (ref 6–23)
CO2: 28 mEq/L (ref 19–32)
Calcium: 9.7 mg/dL (ref 8.4–10.5)
Chloride: 102 mEq/L (ref 96–112)
Creatinine, Ser: 0.94 mg/dL (ref 0.40–1.50)
GFR: 95.55 mL/min (ref >60.00)
Glucose, Bld: 58 mg/dL — ABNORMAL LOW (ref 70–99)
Potassium: 3.8 mEq/L (ref 3.5–5.1)
Sodium: 139 mEq/L (ref 135–145)
Total Bilirubin: 0.6 mg/dL (ref 0.2–1.2)
Total Protein: 7 g/dL (ref 6.0–8.3)

## 2020-08-05 LAB — LIPID PANEL
Cholesterol: 148 mg/dL (ref 0–200)
HDL: 33.1 mg/dL — ABNORMAL LOW (ref >39.00)
LDL Cholesterol: 96 mg/dL (ref 0–99)
NonHDL: 114.8
Total CHOL/HDL Ratio: 4
Triglycerides: 93 mg/dL (ref 0.0–149.0)
VLDL: 18.6 mg/dL (ref 0.0–40.0)

## 2020-08-05 LAB — CBC
HCT: 44.9 % (ref 39.0–52.0)
Hemoglobin: 15.4 g/dL (ref 13.0–17.0)
MCHC: 34.3 g/dL (ref 30.0–36.0)
MCV: 93.2 fl (ref 78.0–100.0)
Platelets: 267 10*3/uL (ref 150.0–400.0)
RBC: 4.82 Mil/uL (ref 4.22–5.81)
RDW: 12.6 % (ref 11.5–15.5)
WBC: 9 10*3/uL (ref 4.0–10.5)

## 2020-08-05 LAB — T4, FREE: Free T4: 0.93 ng/dL (ref 0.60–1.60)

## 2020-08-05 LAB — HEMOGLOBIN A1C: Hgb A1c MFr Bld: 7.6 % — ABNORMAL HIGH (ref 4.6–6.5)

## 2020-08-09 ENCOUNTER — Other Ambulatory Visit: Payer: Self-pay | Admitting: Internal Medicine

## 2020-10-06 ENCOUNTER — Other Ambulatory Visit: Payer: Self-pay | Admitting: Internal Medicine

## 2020-10-06 NOTE — Telephone Encounter (Signed)
Last filled 04-26-20 #60 Last OV/CPE 08-04-20 No Future OV [facility]

## 2020-11-08 ENCOUNTER — Other Ambulatory Visit: Payer: Self-pay | Admitting: Family

## 2020-11-28 ENCOUNTER — Other Ambulatory Visit: Payer: Self-pay | Admitting: Family

## 2020-11-28 ENCOUNTER — Other Ambulatory Visit: Payer: Self-pay | Admitting: Internal Medicine

## 2020-11-28 NOTE — Telephone Encounter (Signed)
Pt wife called checking on the status of the refill of metFORMIN (GLUCOPHAGE) 1000 MG tablet, needs pills immediately.

## 2020-12-12 ENCOUNTER — Other Ambulatory Visit: Payer: Self-pay | Admitting: Family

## 2020-12-15 MED ORDER — CLONAZEPAM 0.5 MG PO TABS: ORAL_TABLET | ORAL | 0 refills | Status: DC

## 2020-12-27 ENCOUNTER — Other Ambulatory Visit: Payer: Self-pay | Admitting: Internal Medicine

## 2020-12-27 NOTE — Telephone Encounter (Signed)
Please contact pt for future appointment. Pt due for 12 month f/u. 

## 2020-12-27 NOTE — Telephone Encounter (Signed)
Attempted to schedule.  LMOV to call office.  ° °

## 2021-01-06 ENCOUNTER — Other Ambulatory Visit: Payer: Self-pay | Admitting: Internal Medicine

## 2021-01-22 ENCOUNTER — Other Ambulatory Visit: Payer: Self-pay | Admitting: Internal Medicine

## 2021-01-23 ENCOUNTER — Other Ambulatory Visit: Payer: Self-pay | Admitting: Internal Medicine

## 2021-01-23 NOTE — Telephone Encounter (Signed)
scheduled

## 2021-01-23 NOTE — Telephone Encounter (Signed)
Attempted to schedule.  LMOV to call office.  ° °

## 2021-01-23 NOTE — Telephone Encounter (Signed)
Please schedule overdue F/U appointment for refills. Thank you! 

## 2021-01-23 NOTE — Telephone Encounter (Signed)
Pt wife called and stated pt will be out of insulin today

## 2021-01-30 ENCOUNTER — Other Ambulatory Visit: Payer: Self-pay | Admitting: Internal Medicine

## 2021-02-21 ENCOUNTER — Other Ambulatory Visit: Payer: Self-pay | Admitting: Internal Medicine

## 2021-02-21 NOTE — Telephone Encounter (Signed)
Last filled 12-11-20 #60 Last OV/CPE 08-04-20 No Future OV [facility]

## 2021-03-04 ENCOUNTER — Other Ambulatory Visit: Payer: Self-pay | Admitting: Internal Medicine

## 2021-03-24 ENCOUNTER — Ambulatory Visit (INDEPENDENT_AMBULATORY_CARE_PROVIDER_SITE_OTHER): Payer: Managed Care, Other (non HMO) | Admitting: Internal Medicine

## 2021-03-24 ENCOUNTER — Other Ambulatory Visit: Payer: Self-pay

## 2021-03-24 ENCOUNTER — Encounter: Payer: Self-pay | Admitting: Internal Medicine

## 2021-03-24 VITALS — BP 140/86 | HR 72 | Ht 69.0 in | Wt 193.0 lb

## 2021-03-24 DIAGNOSIS — E1169 Type 2 diabetes mellitus with other specified complication: Secondary | ICD-10-CM

## 2021-03-24 DIAGNOSIS — I2510 Atherosclerotic heart disease of native coronary artery without angina pectoris: Secondary | ICD-10-CM

## 2021-03-24 DIAGNOSIS — I10 Essential (primary) hypertension: Secondary | ICD-10-CM

## 2021-03-24 DIAGNOSIS — E785 Hyperlipidemia, unspecified: Secondary | ICD-10-CM

## 2021-03-24 NOTE — Progress Notes (Signed)
Follow-up Outpatient Visit Date: 03/24/2021  Primary Care Provider: Karie Schwalbe, MD [address] [place] [address]  Chief Complaint: Follow-up coronary artery disease  HPI:  Mr. [name] [name] is a 51 y.o. male with history of  coronary artery disease status post CABG (07/2016), hypertension, type 2 diabetes mellitus, and anxiety, who presents for follow-up of coronary artery disease and hyperlipidemia.  I last saw him in 12/2019, which time Mr. [name] [name] was feeling well.  He had put on some weight due to less activity at work as well as increased insulin dosing.  We did not make any medication changes or pursue additional testing.  Today, Mr. [name] [name] reports that he continues to feel well.  He denies chest pain, shortness breath, palpitations, lightheadedness, or edema.  He is going to the gym more regularly.  He occasionally checks his blood pressure, notes that it was 130/80 before leaving the house today.  He is tolerating his medications well.  --------------------------------------------------------------------------------------------------  Cardiovascular History & Procedures: Cardiovascular Problems: Coronary artery disease status post CABG for unstable angina (07/2016)   Risk Factors: Known coronary artery disease, hypertension, diabetes mellitus, and male gender   Cath/PCI: LHC (07/19/16): LMCA normal. LAD 90% proximal and 80% mid stenoses. D1 and D2 both with 90% ostial/proximal lesions. Dominant LCx. OM1 with 80% proximal stenosis. lPDA proximally occluded, with distal vessel filling via left-to-left and right-to-left collaterals. Small, non-dominant RCA with 80% mid/distal stenosis. LVEF 55-65%. LVEDP 7 mmHg.   CV Surgery: CABG (07/26/16, Dr. Cornelius Moras): LIMA->LAD, RIMA->acute marginal of RCA, SVG->lPDA, SVG->OM1, and SVG->D2.   EP Procedures and Devices: None   Non-Invasive Evaluation(s): Exercise MPI (07/19/16): High risk study with large in size, severe  anterior/anteroseptal reversible defect and 2 mm of ST depression in inferolateral leads. LVEF 42%.  Recent CV Pertinent Labs: Lab Results  Component Value Date   CHOL 148 08/04/2020   CHOL 141 09/17/2017   HDL 33.10 (L) 08/04/2020   HDL 36 (L) 09/17/2017   LDLCALC 96 08/04/2020   LDLCALC 83 09/17/2017   TRIG 93.0 08/04/2020   CHOLHDL 4 08/04/2020   INR 1.42 07/26/2016   K 3.8 08/04/2020   MG 2.0 07/27/2016   BUN 23 08/04/2020   BUN 30 (H) 02/28/2017   CREATININE 0.94 08/04/2020    Past medical and surgical history were reviewed and updated in EPIC.  Current Meds  Medication Sig   acetaminophen (TYLENOL) 500 MG tablet Take 2 tablets (1,000 mg total) by mouth every 6 (six) hours as needed.   aspirin 81 MG chewable tablet Chew 1 tablet (81 mg total) by mouth daily.   clonazePAM (KLONOPIN) 0.5 MG tablet TAKE 1 TO 2 TABLETS BY MOUTH AT BEDTIME AS NEEDED FOR RESTLESS LEGS   Continuous Blood Gluc Receiver (DEXCOM G6 RECEIVER) DEVI 1 Device by Does not apply route continuous.   Continuous Blood Gluc Sensor (DEXCOM G6 SENSOR) MISC Apply 1 sensor every 10 days   Continuous Blood Gluc Transmit (DEXCOM G6 TRANSMITTER) MISC 1 Device by Does not apply route every 3 (three) months.   dapagliflozin propanediol (FARXIGA) 5 MG TABS tablet Take 1 tablet (5 mg total) by mouth daily before breakfast.   glimepiride (AMARYL) 4 MG tablet TAKE 1 TABLET BY MOUTH DAILY BEFORE BREAKFAST   lisinopril (ZESTRIL) 10 MG tablet TAKE 1 TABLET BY MOUTH DAILY   metFORMIN (GLUCOPHAGE) 1000 MG tablet TAKE 1 TABLET BY MOUTH TWICE DAILY WITH A MEAL.   metoprolol tartrate (LOPRESSOR) 25 MG tablet Take 1 tablet (  25 mg total) by mouth 2 (two) times daily.   nitroGLYCERIN (NITROSTAT) 0.4 MG SL tablet Place 1 tablet (0.4 mg total) under the tongue every 5 (five) minutes as needed for chest pain.   rOPINIRole (REQUIP) 4 MG tablet Take 1 tablet (4 mg total) by mouth at bedtime.   rosuvastatin (CRESTOR) 20 MG tablet TAKE 1  TABLET BY MOUTH DAILY   SEMGLEE, YFGN, 100 UNIT/ML Pen ADMINISTER 35 UNITS UNDER THE SKIN EVERY MORNING AND EVERY NIGHT AT BEDTIME   sertraline (ZOLOFT) 100 MG tablet TAKE 1 TABLET BY MOUTH DAILY    Allergies: Simvastatin  Social History   Tobacco Use   Smoking status: Never   Smokeless tobacco: Never  Vaping Use   Vaping Use: Never used  Substance Use Topics   Alcohol use: No   Drug use: No    Family History  Problem Relation Age of Onset   Heart failure Mother    Diabetes Mother    CAD Paternal Grandfather    Heart attack Paternal Grandfather    Heart attack Paternal Uncle    Cancer Neg Hx     Review of Systems: A 12-system review of systems was performed and was negative except as noted in the HPI.  --------------------------------------------------------------------------------------------------  Physical Exam: BP 140/86 (BP Location: Left Arm, Patient Position: Sitting, Cuff Size: Large)    Pulse 72    Ht 5\' 9"  (1.753 m)    Wt 193 lb (87.5 kg)    SpO2 97%    BMI 28.50 kg/m  Repeat BP 130/80  General:  NAD. Neck: No JVD or HJR. Lungs: Clear to auscultation bilaterally without wheezes or crackles. Heart: Regular rate and rhythm without murmurs, rubs, or gallops. Abdomen: Soft, nontender, nondistended. Extremities: No lower extremity edema.  EKG: Normal sinus rhythm without abnormality.  Lab Results  Component Value Date   WBC 9.0 08/04/2020   HGB 15.4 08/04/2020   HCT 44.9 08/04/2020   MCV 93.2 08/04/2020   PLT 267.0 08/04/2020    Lab Results  Component Value Date   NA 139 08/04/2020   K 3.8 08/04/2020   CL 102 08/04/2020   CO2 28 08/04/2020   BUN 23 08/04/2020   CREATININE 0.94 08/04/2020   GLUCOSE 58 (L) 08/04/2020   ALT 28 08/04/2020    Lab Results  Component Value Date   CHOL 148 08/04/2020   HDL 33.10 (L) 08/04/2020   LDLCALC 96 08/04/2020   TRIG 93.0 08/04/2020   CHOLHDL 4 08/04/2020     --------------------------------------------------------------------------------------------------  ASSESSMENT AND PLAN: Coronary artery disease without angina: Mr. [name] [name] continues to do well without recurrent angina status post CABG in 2018.  We discussed the importance of secondary prevention including appropriate blood pressure, lipid, and glucose control.  We will continue his current medications and work on lifestyle modifications.  Hypertension: Initial blood pressure mildly elevated with repeat slightly better (goal less than 130/80).  We discussed escalation of his antihypertensive regimen, though Mr. [name] [name] wishes to work on lifestyle modifications and monitor his blood pressure at home.  We will continue current doses of metoprolol and lisinopril.  Hyperlipidemia associated with type 2 diabetes mellitus: Lipid suboptimally controlled on last check in 07/2020.  I recommend targeting an LDL less than 70, ideally below 50.  We discussed escalation of rosuvastatin 40 mg daily, though Mr. [name] [name] wishes to defer this until his labs are rechecked at his upcoming visit with Dr. Alphonsus SiasLetvak.  I encouraged him to schedule this appointment at his earliest  convenience.  He should continue current dose of rosuvastatin for now.  Ongoing management of DM per Dr. Alphonsus Sias.  Follow-up: Return to clinic in 1 year.  Yvonne Kendall, MD 03/24/2021 8:44 AM

## 2021-03-24 NOTE — Patient Instructions (Signed)
Medication Instructions:   Your physician recommends that you continue on your current medications as directed. Please refer to the Current Medication list given to you today.  *If you need a refill on your cardiac medications before your next appointment, please call your pharmacy*   Lab Work:  Please reach out to your primary care provider to discuss scheduling a physical with lab work; and discuss increasing Rosuvastatin if LDL is greater than 70.   Testing/Procedures:  None ordered   Follow-Up: At [facility], you and your health needs are our priority.  As part of our continuing mission to provide you with exceptional heart care, we have created designated Provider Care Teams.  These Care Teams include your primary Cardiologist (physician) and Advanced Practice Providers (APPs -  Physician Assistants and Nurse Practitioners) who all work together to provide you with the care you need, when you need it.  We recommend signing up for the patient portal called "MyChart".  Sign up information is provided on this After Visit Summary.  MyChart is used to connect with patients for Virtual Visits (Telemedicine).  Patients are able to view lab/test results, encounter notes, upcoming appointments, etc.  Non-urgent messages can be sent to your provider as well.   To learn more about what you can do with MyChart, go to [email or web address].    Your next appointment:   1 year(s)  The format for your next appointment:   In Person  Provider:   You may see Nelva Bush, MD or one of the following Advanced Practice Providers on your designated Care Team:   Murray Hodgkins, NP Christell Faith, PA-C Cadence Kathlen Mody, [place]

## 2021-03-30 ENCOUNTER — Other Ambulatory Visit: Payer: Self-pay | Admitting: Internal Medicine

## 2021-04-03 ENCOUNTER — Other Ambulatory Visit: Payer: Self-pay | Admitting: Internal Medicine

## 2021-04-24 ENCOUNTER — Other Ambulatory Visit: Payer: Self-pay | Admitting: Internal Medicine

## 2021-04-24 ENCOUNTER — Telehealth: Payer: Self-pay | Admitting: Internal Medicine

## 2021-04-24 NOTE — Telephone Encounter (Signed)
Pt needs diabetes follow-up with Dr Alphonsus Sias. Several months past due.

## 2021-04-25 NOTE — Telephone Encounter (Signed)
Returned phone call, got him schedule for 2/28 @915 

## 2021-04-25 NOTE — Telephone Encounter (Signed)
Called Mr. Dorko to schedule DM. Lvmtcb

## 2021-05-09 ENCOUNTER — Encounter: Payer: Self-pay | Admitting: Internal Medicine

## 2021-05-09 ENCOUNTER — Other Ambulatory Visit: Payer: Self-pay

## 2021-05-09 ENCOUNTER — Ambulatory Visit: Payer: No Typology Code available for payment source | Admitting: Internal Medicine

## 2021-05-09 VITALS — BP 118/78 | HR 73 | Temp 97.9°F | Ht 69.0 in | Wt 195.0 lb

## 2021-05-09 DIAGNOSIS — Z794 Long term (current) use of insulin: Secondary | ICD-10-CM

## 2021-05-09 DIAGNOSIS — E1159 Type 2 diabetes mellitus with other circulatory complications: Secondary | ICD-10-CM

## 2021-05-09 LAB — POCT GLYCOSYLATED HEMOGLOBIN (HGB A1C): Hemoglobin A1C: 8.1 % — AB (ref 4.0–5.6)

## 2021-05-09 MED ORDER — DAPAGLIFLOZIN PROPANEDIOL 10 MG PO TABS: 10.0000 mg | ORAL_TABLET | Freq: Every day | ORAL | 3 refills | Status: DC

## 2021-05-09 NOTE — Progress Notes (Signed)
Subjective:    Patient ID: [name] [name], male    DOB: 06-01-1970, 51 y.o.   MRN: [id number]  HPI Here for follow up of diabetes  Hasn't had visit since May Dexcom transmitter went out--has to get with company to get this replaced Knows he hasn't been as compliant with eating Is on the road all day for work---limits his activity and he is eating fast food Does go to gym twice a week---[employer]  No heart trouble Did see Dr End recently---check up okay No change in exercise tolerance  Current Outpatient Medications on File Prior to Visit  Medication Sig Dispense Refill   acetaminophen (TYLENOL) 500 MG tablet Take 2 tablets (1,000 mg total) by mouth every 6 (six) hours as needed. 30 tablet 0   aspirin 81 MG chewable tablet Chew 1 tablet (81 mg total) by mouth daily. 30 tablet 0   clonazePAM (KLONOPIN) 0.5 MG tablet TAKE 1 TO 2 TABLETS BY MOUTH AT BEDTIME AS NEEDED FOR RESTLESS LEGS 60 tablet 0   Continuous Blood Gluc Receiver (DEXCOM G6 RECEIVER) DEVI 1 Device by Does not apply route continuous. 1 each 0   Continuous Blood Gluc Sensor (DEXCOM G6 SENSOR) MISC Apply 1 sensor every 10 days 9 each 3   Continuous Blood Gluc Transmit (DEXCOM G6 TRANSMITTER) MISC 1 Device by Does not apply route every 3 (three) months. 1 each 3   dapagliflozin propanediol (FARXIGA) 5 MG TABS tablet TAKE 1 TABLET BY MOUTH EVERY DAY BEFORE BREAKFAST 30 tablet 0   glimepiride (AMARYL) 4 MG tablet Take 1 tablet (4 mg total) by mouth daily before breakfast. NEEDS OFFICE VISIT 90 tablet 0   lisinopril (ZESTRIL) 10 MG tablet TAKE 1 TABLET BY MOUTH DAILY 30 tablet 11   metFORMIN (GLUCOPHAGE) 1000 MG tablet TAKE 1 TABLET BY MOUTH TWICE DAILY WITH A MEAL. 60 tablet 11   metoprolol tartrate (LOPRESSOR) 25 MG tablet TAKE ONE (1) TABLET BY MOUTH TWO TIMES PER DAY . 60 tablet 10   nitroGLYCERIN (NITROSTAT) 0.4 MG SL tablet Place 1 tablet (0.4 mg total) under the tongue every 5 (five) minutes as needed for chest pain. 25 tablet  prn   rOPINIRole (REQUIP) 4 MG tablet Take 1 tablet (4 mg total) by mouth at bedtime. 90 tablet 3   rosuvastatin (CRESTOR) 20 MG tablet TAKE 1 TABLET BY MOUTH DAILY 30 tablet 11   SEMGLEE, YFGN, 100 UNIT/ML Pen ADMINISTER 35 UNITS UNDER THE SKIN EVERY MORNING AND EVERY NIGHT AT BEDTIME 15 mL 1   sertraline (ZOLOFT) 100 MG tablet TAKE 1 TABLET BY MOUTH DAILY 90 tablet 3   No current facility-administered medications on file prior to visit.    Allergies  Allergen Reactions   Simvastatin Other (See Comments)    Elevated LFTs?    Past Medical History:  Diagnosis Date   Anxiety    Coronary artery calcification    a. 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   LIMA to LAD, RIMA to AM, SVG to D2, SVG to OM1, SVG to PDA, EVH via right thigh and leg   Type II diabetes mellitus (HCC)     Past Surgical History:  Procedure Laterality Date   CARDIAC CATHETERIZATION  07/19/2016   CORONARY ARTERY BYPASS GRAFT N/A 07/26/2016   Procedure: CORONARY ARTERY  BYPASS GRAFTING (CABG), ON PUMP, TIMES FIVE, USING BILATERAL INTERNAL MAMMARY ARTERIES AND ENDOSCOPICALLY HARVESTED RIGHT GREATER SAPHENOUS VEIN;  Surgeon: Purcell Nails, MD;  Location: [facility] OR;  Service: Open Heart Surgery;  Laterality: N/A;  LIMA to LAD RIMA to Acute Marginal SVG to PDA SVG to Diag 2 SVG to OM1   LEFT HEART CATH AND CORONARY ANGIOGRAPHY N/A 07/19/2016   Procedure: Left Heart Cath and Coronary Angiography;  Surgeon: Corky Crafts, MD;  Location: [facility] INVASIVE CV LAB;  Service: Cardiovascular;  Laterality: N/A;   ORCHIOPEXY Right ~ 1980   "undescending testicle"   TEE WITHOUT CARDIOVERSION N/A 07/26/2016   Procedure: TRANSESOPHAGEAL ECHOCARDIOGRAM (TEE);  Surgeon: Purcell Nails, MD;  Location: [facility] OR;  Service: Open Heart Surgery;  Laterality: N/A;    Family History  Problem Relation  Age of Onset   Heart failure Mother    Diabetes Mother    CAD Paternal Grandfather    Heart attack Paternal Grandfather    Heart attack Paternal Uncle    Cancer Neg Hx     Social History   Socioeconomic History   Marital status: Married    Spouse name: Not on file   Number of children: 0   Years of education: Not on file   Highest education level: Not on file  Occupational History   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[employer]  Tobacco Use   Smoking status: Never    Passive exposure: Never   Smokeless tobacco: Never  Vaping Use   Vaping Use: Never used  Substance and Sexual Activity   Alcohol use: No   Drug use: No   Sexual activity: Yes  Other Topics Concern   Not on file  Social History Narrative   Divorced then remarried 2015   2 stepchildren from prior marriage   2 stepsons from this marriage   Social Determinants of Corporate investment banker Strain: Not on file  Food Insecurity: Not on file  Transportation Needs: Not on file  Physical Activity: Not on file  Stress: Not on file  Social Connections: Not on file  Intimate Partner Violence: Not on file   Review of Systems Weight fairly stable Sleeps "so-so"---wife concerned about the restless legs     Objective:   Physical Exam Constitutional:      Appearance: Normal appearance.  Cardiovascular:     Rate and Rhythm: Normal rate and regular rhythm.     Pulses: Normal pulses.     Heart sounds: No murmur heard.   No gallop.  Pulmonary:     Effort: Pulmonary effort is normal.     Breath sounds: Normal breath sounds. No wheezing or rales.  Musculoskeletal:     Cervical back: Neck supple.     Right lower leg: No edema.     Left lower leg: No edema.  Lymphadenopathy:     Cervical: No cervical adenopathy.  Skin:    Comments: No foot lesions  Neurological:     Mental Status: He is alert.           Assessment & Plan:

## 2021-05-09 NOTE — Assessment & Plan Note (Signed)
Lab Results  Component Value Date   HGBA1C 8.1 (A) 05/09/2021   Has worsened Goal is definitely under 7.5% On semglee 35 bid, farxiga 5, glimepiride 4 and metformin 1000 bid Will increase the farxiga to 10 He will give up fast food Get the dexcom working and monitor better

## 2021-05-23 ENCOUNTER — Other Ambulatory Visit: Payer: Self-pay | Admitting: Internal Medicine

## 2021-06-01 ENCOUNTER — Other Ambulatory Visit: Payer: Self-pay | Admitting: Internal Medicine

## 2021-07-03 ENCOUNTER — Other Ambulatory Visit: Payer: Self-pay | Admitting: Internal Medicine

## 2021-07-16 ENCOUNTER — Other Ambulatory Visit: Payer: Self-pay | Admitting: Internal Medicine

## 2021-07-17 ENCOUNTER — Other Ambulatory Visit: Payer: Self-pay | Admitting: Internal Medicine

## 2021-07-18 NOTE — Telephone Encounter (Signed)
Last filled 02-22-21 #60 ?Last OV 05-09-21 ?Next OV 08-08-21 ?[facility] ?

## 2021-07-20 ENCOUNTER — Other Ambulatory Visit: Payer: Self-pay | Admitting: Internal Medicine

## 2021-08-08 ENCOUNTER — Encounter: Payer: No Typology Code available for payment source | Admitting: Internal Medicine

## 2021-08-29 ENCOUNTER — Other Ambulatory Visit: Payer: Self-pay | Admitting: Internal Medicine

## 2021-08-29 NOTE — Telephone Encounter (Signed)
Patient is calling in, he will be out tonight and is asking if we can get this sent in today.

## 2021-09-05 ENCOUNTER — Other Ambulatory Visit: Payer: Self-pay | Admitting: Internal Medicine

## 2021-09-27 ENCOUNTER — Other Ambulatory Visit: Payer: Self-pay | Admitting: Internal Medicine

## 2021-10-12 ENCOUNTER — Encounter: Payer: Self-pay | Admitting: Internal Medicine

## 2021-10-12 ENCOUNTER — Ambulatory Visit (INDEPENDENT_AMBULATORY_CARE_PROVIDER_SITE_OTHER): Payer: No Typology Code available for payment source | Admitting: Internal Medicine

## 2021-10-12 VITALS — BP 106/72 | HR 69 | Temp 98.0°F | Ht 69.0 in | Wt 192.0 lb

## 2021-10-12 DIAGNOSIS — E1159 Type 2 diabetes mellitus with other circulatory complications: Secondary | ICD-10-CM | POA: Diagnosis not present

## 2021-10-12 DIAGNOSIS — F419 Anxiety disorder, unspecified: Secondary | ICD-10-CM | POA: Diagnosis not present

## 2021-10-12 DIAGNOSIS — Z1211 Encounter for screening for malignant neoplasm of colon: Secondary | ICD-10-CM

## 2021-10-12 DIAGNOSIS — I2510 Atherosclerotic heart disease of native coronary artery without angina pectoris: Secondary | ICD-10-CM

## 2021-10-12 DIAGNOSIS — Z0000 Encounter for general adult medical examination without abnormal findings: Secondary | ICD-10-CM

## 2021-10-12 DIAGNOSIS — G2581 Restless legs syndrome: Secondary | ICD-10-CM

## 2021-10-12 DIAGNOSIS — Z794 Long term (current) use of insulin: Secondary | ICD-10-CM | POA: Diagnosis not present

## 2021-10-12 LAB — POCT GLYCOSYLATED HEMOGLOBIN (HGB A1C): Hemoglobin A1C: 7.6 % — AB (ref 4.0–5.6)

## 2021-10-12 LAB — HM DIABETES FOOT EXAM

## 2021-10-12 NOTE — Assessment & Plan Note (Signed)
Doing better with ropinirole 2mg  in evening/2mg  bedtime Clonazepam prn

## 2021-10-12 NOTE — Assessment & Plan Note (Signed)
Doing well Overdue for dentist and eye doctor Does some exercise Will set up for colonoscopy Consider PSA at 56 Updated COVID and flu vaccines in fall

## 2021-10-12 NOTE — Progress Notes (Signed)
Subjective:    Patient ID: [name] [name], male    DOB: 09/05/1970, 51 y.o.   MRN: [id number]  HPI Here for physical  Sugars are some better His Dexcom---suggests A1c of 7.3%---and we got 7.6% here  Has some spots on arm to be checked and his back  No heart problems Tries to do some walking  Current Outpatient Medications on File Prior to Visit  Medication Sig Dispense Refill   acetaminophen (TYLENOL) 500 MG tablet Take 2 tablets (1,000 mg total) by mouth every 6 (six) hours as needed. 30 tablet 0   aspirin 81 MG chewable tablet Chew 1 tablet (81 mg total) by mouth daily. 30 tablet 0   clonazePAM (KLONOPIN) 0.5 MG tablet TAKE 1 TO 2 TABLETS BY MOUTH AT BEDTIME AS NEEDED FOR RESTLESS LEGS 60 tablet 0   Continuous Blood Gluc Receiver (DEXCOM G6 RECEIVER) DEVI 1 Device by Does not apply route continuous. 1 each 0   Continuous Blood Gluc Sensor (DEXCOM G6 SENSOR) MISC APPLY 1 SENSOR EVERY 10 DAYS (REMOVE OLD) 9 each 3   Continuous Blood Gluc Transmit (DEXCOM G6 TRANSMITTER) MISC CHANGE EVERY 3 MONTHS AS DIRECTED 1 each 3   dapagliflozin propanediol (FARXIGA) 10 MG TABS tablet Take 1 tablet (10 mg total) by mouth daily. 90 tablet 3   glimepiride (AMARYL) 4 MG tablet TAKE 1 TABLET BY MOUTH DAILY BEFORE BREAKFAST (NEEDS OFFICE VISIT) 90 tablet 3   insulin glargine-yfgn (SEMGLEE, YFGN,) 100 UNIT/ML Pen ADMINISTER 35 UNITS UNDER THE SKIN EVERY MORNING AND EVERY NIGHT AT BEDTIME 15 mL 2   lisinopril (ZESTRIL) 10 MG tablet TAKE 1 TABLET BY MOUTH DAILY 30 tablet 11   metFORMIN (GLUCOPHAGE) 1000 MG tablet TAKE 1 TABLET BY MOUTH TWICE DAILY WITH A MEAL. 60 tablet 11   metoprolol tartrate (LOPRESSOR) 25 MG tablet TAKE ONE (1) TABLET BY MOUTH TWO TIMES PER DAY . 60 tablet 10   nitroGLYCERIN (NITROSTAT) 0.4 MG SL tablet Place 1 tablet (0.4 mg total) under the tongue every 5 (five) minutes as needed for chest pain. 25 tablet prn   rOPINIRole (REQUIP) 4 MG tablet TAKE ONE TABLET (4 MG) BY MOUTH AT BEDTIME  90 tablet 0   rosuvastatin (CRESTOR) 20 MG tablet TAKE 1 TABLET BY MOUTH DAILY 30 tablet 11   sertraline (ZOLOFT) 100 MG tablet TAKE 1 TABLET BY MOUTH DAILY 90 tablet 0   No current facility-administered medications on file prior to visit.    Allergies  Allergen Reactions   Simvastatin Other (See Comments)    Elevated LFTs?    Past Medical History:  Diagnosis Date   Anxiety    Coronary artery calcification    a. 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   LIMA to LAD, RIMA to AM, SVG to D2, SVG to OM1, SVG to PDA, EVH via right thigh and leg   Type II diabetes mellitus (HCC)     Past Surgical History:  Procedure Laterality Date   CARDIAC CATHETERIZATION  07/19/2016   CORONARY ARTERY BYPASS GRAFT N/A 07/26/2016   Procedure: CORONARY ARTERY BYPASS GRAFTING (CABG), ON PUMP, TIMES FIVE, USING BILATERAL INTERNAL MAMMARY ARTERIES AND ENDOSCOPICALLY HARVESTED RIGHT GREATER SAPHENOUS VEIN;  Surgeon: Purcell Nails, MD;  Location: [facility] OR;  Service: Open Heart Surgery;  Laterality: N/A;  LIMA to LAD RIMA to Acute Marginal SVG to PDA SVG to Diag 2 SVG to OM1   LEFT HEART CATH AND CORONARY ANGIOGRAPHY N/A 07/19/2016   Procedure: Left Heart Cath and Coronary Angiography;  Surgeon: Corky Crafts, MD;  Location: [facility] INVASIVE CV LAB;  Service: Cardiovascular;  Laterality: N/A;   ORCHIOPEXY Right ~ 1980   "undescending testicle"   TEE WITHOUT CARDIOVERSION N/A 07/26/2016   Procedure: TRANSESOPHAGEAL ECHOCARDIOGRAM (TEE);  Surgeon: Purcell Nails, MD;  Location: [facility] OR;  Service: Open Heart Surgery;  Laterality: N/A;    Family History  Problem Relation Age of Onset   Heart failure Mother    Diabetes Mother    CAD Paternal Grandfather    Heart attack Paternal Grandfather    Heart attack Paternal Uncle    Cancer Neg Hx     Social History    Socioeconomic History   Marital status: Married    Spouse name: Not on file   Number of children: 0   Years of education: Not on file   Highest education level: Not on file  Occupational History   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[employer]  Tobacco Use   Smoking status: Never    Passive exposure: Never   Smokeless tobacco: Never  Vaping Use   Vaping Use: Never used  Substance and Sexual Activity   Alcohol use: No   Drug use: No   Sexual activity: Yes  Other Topics Concern   Not on file  Social History Narrative   Divorced then remarried 2015   2 stepchildren from prior marriage   2 stepsons from this marriage   Social Determinants of Corporate investment banker Strain: Not on file  Food Insecurity: Not on file  Transportation Needs: Not on file  Physical Activity: Not on file  Stress: Not on file  Social Connections: Not on file  Intimate Partner Violence: Not on file   Review of Systems  Constitutional:  Negative for fatigue and unexpected weight change.       Wears seat belt  HENT:  Negative for dental problem, hearing loss and tinnitus.        Overdue with dentist  Eyes:  Negative for visual disturbance.       No diplopia or unilateral vision loss Plans to go to eye doctor soon  Respiratory:  Negative for cough, chest tightness and shortness of breath.   Cardiovascular:  Negative for chest pain, palpitations and leg swelling.  Gastrointestinal:  Negative for blood in stool and constipation.       No heartburn  Endocrine: Negative for polydipsia and polyuria.  Genitourinary:  Negative for difficulty urinating and urgency.       No sig ED  Musculoskeletal:  Negative for arthralgias, back pain and joint swelling.  Skin:        Needs areas checked  Allergic/Immunologic: Negative for environmental allergies and immunocompromised state.  Neurological:  Negative for dizziness, syncope, light-headedness and headaches.  Hematological:  Negative for  adenopathy. Does not bruise/bleed easily.  Psychiatric/Behavioral:         Sleeping better now--more consistent with CPAP (feels better after wearing) Mood pretty good on the medication       Objective:   Physical Exam Constitutional:      Appearance: Normal appearance.  HENT:     Mouth/Throat:     Pharynx: No oropharyngeal exudate or posterior oropharyngeal erythema.  Eyes:     Conjunctiva/sclera: Conjunctivae normal.  Pupils: Pupils are equal, round, and reactive to light.  Cardiovascular:     Rate and Rhythm: Normal rate and regular rhythm.     Pulses: Normal pulses.     Heart sounds: No murmur heard.    No gallop.  Pulmonary:     Effort: Pulmonary effort is normal.     Breath sounds: Normal breath sounds. No wheezing or rales.  Abdominal:     Palpations: Abdomen is soft.     Tenderness: There is no abdominal tenderness.  Musculoskeletal:     Cervical back: Neck supple.     Right lower leg: No edema.     Left lower leg: No edema.  Lymphadenopathy:     Cervical: No cervical adenopathy.  Skin:    Comments: Seb keratosis on back Mild actinic damage on arms---just recommended regular sunblock (and may want to start with dermatologist)  Neurological:     General: No focal deficit present.     Mental Status: He is alert and oriented to person, place, and time.     Comments: Normal sensation in feet  Psychiatric:        Mood and Affect: Mood normal.        Behavior: Behavior normal.            Assessment & Plan:

## 2021-10-12 NOTE — Assessment & Plan Note (Signed)
Lab Results  Component Value Date   HGBA1C 7.6 (A) 10/12/2021   Better control with metformin 1000 bid, farxiga 10, glimepiride 4mg  daily Known CAD

## 2021-10-12 NOTE — Assessment & Plan Note (Signed)
Mood better On the sertraline 100

## 2021-10-12 NOTE — Assessment & Plan Note (Signed)
No symptoms now On lisinopril 10, crestor 20, asa 81, metoprolol 25 bid

## 2021-10-13 LAB — MICROALBUMIN / CREATININE URINE RATIO
Creatinine,U: 59.2 mg/dL
Microalb Creat Ratio: 1.3 mg/g (ref 0.0–30.0)
Microalb, Ur: 0.8 mg/dL (ref 0.0–1.9)

## 2021-10-13 LAB — COMPREHENSIVE METABOLIC PANEL
ALT: 36 U/L (ref 0–53)
AST: 23 U/L (ref 0–37)
Albumin: 4.5 g/dL (ref 3.5–5.2)
Alkaline Phosphatase: 67 U/L (ref 39–117)
BUN: 23 mg/dL (ref 6–23)
CO2: 29 mEq/L (ref 19–32)
Calcium: 9.7 mg/dL (ref 8.4–10.5)
Chloride: 100 mEq/L (ref 96–112)
Creatinine, Ser: 0.98 mg/dL (ref 0.40–1.50)
GFR: 90.14 mL/min (ref >60.00)
Glucose, Bld: 99 mg/dL (ref 70–99)
Potassium: 4 mEq/L (ref 3.5–5.1)
Sodium: 137 mEq/L (ref 135–145)
Total Bilirubin: 0.6 mg/dL (ref 0.2–1.2)
Total Protein: 7.2 g/dL (ref 6.0–8.3)

## 2021-10-13 LAB — CBC
HCT: 45.8 % (ref 39.0–52.0)
Hemoglobin: 15.6 g/dL (ref 13.0–17.0)
MCHC: 34.2 g/dL (ref 30.0–36.0)
MCV: 94.1 fl (ref 78.0–100.0)
Platelets: 236 10*3/uL (ref 150.0–400.0)
RBC: 4.87 Mil/uL (ref 4.22–5.81)
RDW: 13.4 % (ref 11.5–15.5)
WBC: 6.7 10*3/uL (ref 4.0–10.5)

## 2021-10-13 LAB — LIPID PANEL
Cholesterol: 147 mg/dL (ref 0–200)
HDL: 34 mg/dL — ABNORMAL LOW (ref >39.00)
LDL Cholesterol: 88 mg/dL (ref 0–99)
NonHDL: 112.73
Total CHOL/HDL Ratio: 4
Triglycerides: 122 mg/dL (ref 0.0–149.0)
VLDL: 24.4 mg/dL (ref 0.0–40.0)

## 2021-10-18 ENCOUNTER — Encounter: Payer: No Typology Code available for payment source | Admitting: Internal Medicine

## 2021-10-31 ENCOUNTER — Other Ambulatory Visit: Payer: Self-pay | Admitting: Internal Medicine

## 2021-10-31 NOTE — Telephone Encounter (Signed)
Last filled 07-18-21 #60 Last OV 10-12-21 Next OV 04-16-22 [facility]

## 2021-11-03 ENCOUNTER — Other Ambulatory Visit: Payer: Self-pay | Admitting: Internal Medicine

## 2021-11-27 ENCOUNTER — Encounter: Payer: Self-pay | Admitting: Internal Medicine

## 2021-12-02 ENCOUNTER — Other Ambulatory Visit: Payer: Self-pay | Admitting: Internal Medicine

## 2021-12-19 ENCOUNTER — Other Ambulatory Visit: Payer: Self-pay

## 2021-12-19 ENCOUNTER — Other Ambulatory Visit: Payer: Self-pay | Admitting: Internal Medicine

## 2021-12-19 MED ORDER — METFORMIN HCL 1000 MG PO TABS: 1000.0000 mg | ORAL_TABLET | Freq: Two times a day (BID) | ORAL | 2 refills | Status: DC

## 2021-12-30 ENCOUNTER — Other Ambulatory Visit: Payer: Self-pay | Admitting: Internal Medicine

## 2022-01-01 ENCOUNTER — Other Ambulatory Visit: Payer: Self-pay | Admitting: Internal Medicine

## 2022-02-12 ENCOUNTER — Other Ambulatory Visit: Payer: Self-pay | Admitting: Internal Medicine

## 2022-02-19 ENCOUNTER — Other Ambulatory Visit: Payer: Self-pay | Admitting: Internal Medicine

## 2022-02-27 ENCOUNTER — Other Ambulatory Visit: Payer: Self-pay | Admitting: Internal Medicine

## 2022-03-19 ENCOUNTER — Other Ambulatory Visit: Payer: Self-pay | Admitting: Internal Medicine

## 2022-03-19 NOTE — Telephone Encounter (Signed)
Please schedule F/U appointment for further refills. Thank you! 

## 2022-04-02 ENCOUNTER — Other Ambulatory Visit: Payer: Self-pay | Admitting: Internal Medicine

## 2022-04-02 NOTE — Telephone Encounter (Signed)
Last filled 10-31-21 #30 Last OV 10-12-21 Next OV 04-16-22 [facility]

## 2022-04-09 ENCOUNTER — Ambulatory Visit: Payer: No Typology Code available for payment source | Admitting: Medical

## 2022-04-16 ENCOUNTER — Ambulatory Visit: Payer: No Typology Code available for payment source | Admitting: Internal Medicine

## 2022-04-24 ENCOUNTER — Ambulatory Visit: Payer: No Typology Code available for payment source | Admitting: Medical

## 2022-04-30 ENCOUNTER — Ambulatory Visit: Payer: No Typology Code available for payment source | Admitting: Internal Medicine

## 2022-05-22 ENCOUNTER — Ambulatory Visit: Payer: No Typology Code available for payment source | Admitting: Internal Medicine

## 2022-05-22 ENCOUNTER — Other Ambulatory Visit: Payer: Self-pay | Admitting: Internal Medicine

## 2022-05-28 ENCOUNTER — Other Ambulatory Visit: Payer: Self-pay | Admitting: Internal Medicine

## 2022-05-28 NOTE — Telephone Encounter (Signed)
PLEASE KEEP APPOINTMENT ON 05/29/2022 FOR FURTHER REFILLS. THANK YOU.  Last office visit 03-24-21

## 2022-05-29 ENCOUNTER — Ambulatory Visit: Payer: No Typology Code available for payment source | Attending: Medical | Admitting: Medical

## 2022-05-29 ENCOUNTER — Encounter: Payer: Self-pay | Admitting: Medical

## 2022-05-29 VITALS — BP 120/78 | HR 81 | Ht 69.0 in | Wt 190.8 lb

## 2022-05-29 DIAGNOSIS — I2510 Atherosclerotic heart disease of native coronary artery without angina pectoris: Secondary | ICD-10-CM | POA: Diagnosis not present

## 2022-05-29 DIAGNOSIS — E782 Mixed hyperlipidemia: Secondary | ICD-10-CM | POA: Diagnosis not present

## 2022-05-29 DIAGNOSIS — I10 Essential (primary) hypertension: Secondary | ICD-10-CM | POA: Diagnosis not present

## 2022-05-29 MED ORDER — METOPROLOL TARTRATE 25 MG PO TABS: 25.0000 mg | ORAL_TABLET | Freq: Two times a day (BID) | ORAL | 1 refills | Status: DC

## 2022-05-29 MED ORDER — ROSUVASTATIN CALCIUM 20 MG PO TABS: 20.0000 mg | ORAL_TABLET | Freq: Every day | ORAL | 1 refills | Status: DC

## 2022-05-29 NOTE — Patient Instructions (Addendum)
Medication Instructions:  Your physician recommends that you continue on your current medications as directed. Please refer to the Current Medication list given to you today.  *If you need a refill on your cardiac medications before your next appointment, please call your pharmacy*   Lab Work: None ordered  If you have labs (blood work) drawn today and your tests are completely normal, you will receive your results only by: [place] (if you have MyChart) OR A paper copy in the mail If you have any lab test that is abnormal or we need to change your treatment, we will call you to review the results.   Testing/Procedures: None ordered  Follow-Up: At [facility], you and your health needs are our priority.  As part of our continuing mission to provide you with exceptional heart care, we have created designated Provider Care Teams.  These Care Teams include your primary Cardiologist (physician) and Advanced Practice Providers (APPs -  Physician Assistants and Nurse Practitioners) who all work together to provide you with the care you need, when you need it.  We recommend signing up for the patient portal called "MyChart".  Sign up information is provided on this After Visit Summary.  MyChart is used to connect with patients for Virtual Visits (Telemedicine).  Patients are able to view lab/test results, encounter notes, upcoming appointments, etc.  Non-urgent messages can be sent to your provider as well.   To learn more about what you can do with MyChart, go to [email or web address].    Your next appointment:   1 year(s)  Provider:   Nelva Bush, MD

## 2022-05-29 NOTE — Progress Notes (Signed)
Cardiology Office Note:    Date:  05/29/2022   ID:  [name] [name], DOB 04/23/1970, MRN [id number]  PCP:  Venia Carbon, MD  [facility] Cardiologist:  Nelva Bush, MD  [facility] Electrophysiologist:  None   Referring MD: Venia Carbon, MD   Chief Complaint: 1 year follow-up  History of Present Illness:    [name] [name] is a 52 y.o. male with a hx of CAD status post CABG in May 2018, hypertension, type 2 diabetes, and anxiety who presents for 1 year follow-up.  Patient underwent CABG in May 2018 with LIMA to LAD, RIMA to acute marginal of RCA, SVG to 1PDA, SVG to OM1, and SVG to D2.  Patient was last seen in January 2023 and was overall doing well from a cardiac perspective.  Today, the patient reports overall doing well. No significant medical changes since the last visit. No chest pain or shortness of breath. No lower leg edema, orthopnea, pnd, dizziness, lightheadedness. 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 Patient plans on getting back into walking. Diet is more eating out with the job. Wife started a diet, so they will work on diet improvement. Needs refills of Crestor and metoprolol. He will see PCP next week.   Past Medical History:  Diagnosis Date   Anxiety    Coronary artery calcification    a. 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   LIMA to LAD, RIMA to AM, SVG to D2, SVG to OM1, SVG to PDA, EVH via right thigh and leg   Type II diabetes mellitus ([place])     Past Surgical History:  Procedure Laterality Date   CARDIAC CATHETERIZATION  07/19/2016   CORONARY ARTERY BYPASS GRAFT N/A 07/26/2016   Procedure: CORONARY ARTERY BYPASS GRAFTING (CABG), ON PUMP, TIMES FIVE, USING BILATERAL INTERNAL MAMMARY ARTERIES AND ENDOSCOPICALLY HARVESTED RIGHT GREATER SAPHENOUS VEIN;  Surgeon: Rexene Alberts, MD;  Location:  [place];  Service: Open Heart Surgery;  Laterality: N/A;  LIMA to LAD RIMA to Acute Marginal SVG to PDA SVG to Diag 2 SVG to OM1   LEFT HEART CATH AND CORONARY ANGIOGRAPHY N/A 07/19/2016   Procedure: Left Heart Cath and Coronary Angiography;  Surgeon: Jettie Booze, MD;  Location: [place] [facility];  Service: Cardiovascular;  Laterality: N/A;   ORCHIOPEXY Right ~ 1980   "undescending testicle"   TEE WITHOUT CARDIOVERSION N/A 07/26/2016   Procedure: TRANSESOPHAGEAL ECHOCARDIOGRAM (TEE);  Surgeon: Rexene Alberts, MD;  Location: [place];  Service: Open Heart Surgery;  Laterality: N/A;    Current Medications: Current Meds  Medication Sig   acetaminophen (TYLENOL) 500 MG tablet Take 2 tablets (1,000 mg total) by mouth every 6 (six) hours as needed.   aspirin 81 MG chewable tablet Chew 1 tablet (81 mg total) by mouth daily.   clonazePAM (KLONOPIN) 1 MG tablet TAKE 1/2 TO 1 TABLET AT BEDTIME IF NEEDED FOR RESTLESS LEGS   dapagliflozin propanediol (FARXIGA) 10 MG TABS tablet TAKE ONE TABLET (10 MG) BY MOUTH EVERY DAY   glimepiride (AMARYL) 4 MG tablet TAKE 1 TABLET BY MOUTH DAILY BEFORE BREAKFAST (NEEDS OFFICE VISIT)   lisinopril (ZESTRIL) 10 MG tablet Take 1 tablet (10 mg total) by mouth daily. PLEASE KEEP APPOINTMENT ON 05/29/2022 FOR FURTHER REFILLS.  THANK YOU.   metFORMIN (GLUCOPHAGE) 1000 MG tablet TAKE ONE (1) TABLET BY MOUTH TWO TIMES PER DAY WITH MEALS   nitroGLYCERIN (NITROSTAT) 0.4 MG SL tablet Place 1 tablet (0.4 mg total) under the tongue every 5 (five) minutes as needed for chest pain.   rOPINIRole (REQUIP) 4 MG tablet TAKE ONE TABLET (4 MG) BY MOUTH AT BEDTIME   SEMGLEE, YFGN, 100 UNIT/ML Pen ADMINISTER 35 UNITS UNDER THE SKIN EVERY MORNING AND EVERY NIGHT AT BEDTIME   sertraline (ZOLOFT) 100 MG tablet TAKE 1 TABLET BY MOUTH DAILY   [DISCONTINUED] metoprolol tartrate (LOPRESSOR) 25 MG tablet TAKE 1 TABLET BY MOUTH TWICE DAILY   [DISCONTINUED] rosuvastatin (CRESTOR) 20 MG tablet  TAKE 1 TABLET BY MOUTH DAILY     Allergies:   Simvastatin   Social History   Socioeconomic History   Marital status: Married    Spouse name: Not on file   Number of children: 0   Years of education: Not on file   Highest education level: Not on file  Occupational History   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[employer]  Tobacco Use   Smoking status: Never    Passive exposure: Never   Smokeless tobacco: Never  Vaping Use   Vaping Use: Never used  Substance and Sexual Activity   Alcohol use: No   Drug use: No   Sexual activity: Yes  Other Topics Concern   Not on file  Social History Narrative   Divorced then remarried 2015   2 stepchildren from prior marriage   2 stepsons from this marriage   Social Determinants of Radio broadcast assistant Strain: Not on file  Food Insecurity: Not on file  Transportation Needs: Not on file  Physical Activity: Not on file  Stress: Not on file  Social Connections: Not on file     Family History: The patient's family history includes CAD in his paternal grandfather; Diabetes in his mother; Heart attack in his paternal grandfather and paternal uncle; Heart failure in his mother. There is no history of Cancer.  ROS:   Please see the history of present illness.     All other systems reviewed and are negative.  EKGs/Labs/Other Studies Reviewed:    The following studies were reviewed today:  Echo TEE 07/2016  Left ventricle: Normal wall thickness. Cavity is mildly dilated. LV  systolic function is mildly reduced with an EF of 45-50%. There are no  obvious wall motion abnormalities. No thrombus present. No mass present.   Aortic valve: The valve is trileaflet. No stenosis. Trace regurgitation.   Mitral valve: Trace regurgitation.   Left ventricle: Normal cavity size.   LHC 2018   Ost 1st Diag to 1st Diag lesion, 90 %stenosed. Prox LAD lesion, 90 %stenosed. Ost 1st Mrg to 1st Mrg lesion, 80 %stenosed. Mid RCA lesion, 80  %stenosed. LPDA lesion, 100 %stenosed. Left to left collaterals, right to left collaterals. Mid LAD lesion, 80 %stenosed. Ost 2nd Diag lesion, 90 %stenosed. The left ventricular systolic function is normal. LV end diastolic pressure is normal. The left ventricular ejection fraction is 55-65% by visual estimate. There is no aortic valve stenosis.   Severe native three vessel disease.  Left dominant system.  Will obtain cardiac surgery consult in AM.    EKG:  EKG is ordered today.  The ekg ordered today demonstrates NSR, 81bpm, no ischemic changes  Recent Labs: 10/12/2021: ALT 36; BUN 23; Creatinine, Ser 0.98; Hemoglobin 15.6; Platelets 236.0; Potassium 4.0; Sodium 137  Recent Lipid Panel    Component Value Date/Time   CHOL 147 10/12/2021 1627   CHOL 141 09/17/2017 0808   TRIG 122.0 10/12/2021 1627   HDL 34.00 (L) 10/12/2021 1627   HDL 36 (L) 09/17/2017 0808   CHOLHDL 4 10/12/2021 1627   VLDL 24.4 10/12/2021 1627   LDLCALC 88 10/12/2021 1627   LDLCALC 83 09/17/2017 0808    Physical Exam:    VS:  BP 120/78 (BP Location: Left Arm, Patient Position: Sitting, Cuff Size: Normal)   Pulse 81   Ht 5\' 9"  (1.753 m)   Wt 190 lb 12.8 oz (86.5 kg)   SpO2 96%   BMI 28.18 kg/m     Wt Readings from Last 3 Encounters:  05/29/22 190 lb 12.8 oz (86.5 kg)  10/12/21 192 lb (87.1 kg)  05/09/21 195 lb (88.5 kg)     GEN:  Well nourished, well developed in no acute distress HEENT: Normal NECK: No JVD; No carotid bruits LYMPHATICS: No lymphadenopathy CARDIAC: RRR, no murmurs, rubs, gallops RESPIRATORY:  Clear to auscultation without rales, wheezing or rhonchi  ABDOMEN: Soft, non-tender, non-distended MUSCULOSKELETAL:  No edema; No deformity  SKIN: Warm and dry NEUROLOGIC:  Alert and oriented x 3 PSYCHIATRIC:  Normal affect   ASSESSMENT:    1. Coronary artery disease involving native coronary artery of native heart without angina pectoris   2. Essential hypertension   3.  Hyperlipidemia, mixed    PLAN:    In order of problems listed above:  CAD s/p CABG in 2018 without angina Patient denies anginal symptoms. Discussed lifestyle changes today. Patient is planning on getting back to walking and making healthier diet choices. No further ischemic work-up indicated at this time. Continue Aspirin 81mg  daily, metoprolol 25 mg twice daily, and Crestor 20 mg daily.  I will refill metoprolol and Crestor today.  Hypertension Pressure good today, continue current medications  Hyperlipidemia Most recent LDL 88, goal less than 70.  This was discussed with the patient.  Patient is on Crestor 20 mg daily.  Lifestyle changes encouraged.  We also discussed increasing Crestor versus adding Zetia.  Patient has follow-up with PCP next week with blood work, he would like to wait on changes until that time.  Disposition: Follow up in 1 year(s) with MD/APP   Signed, Myliah Medel Ninfa Meeker, PA-C  05/29/2022 9:10 AM    [facility]

## 2022-06-05 ENCOUNTER — Ambulatory Visit: Payer: No Typology Code available for payment source | Admitting: Internal Medicine

## 2022-06-05 ENCOUNTER — Encounter: Payer: Self-pay | Admitting: Internal Medicine

## 2022-06-05 VITALS — BP 120/76 | HR 80 | Temp 98.0°F | Ht 69.0 in | Wt 192.0 lb

## 2022-06-05 DIAGNOSIS — I2510 Atherosclerotic heart disease of native coronary artery without angina pectoris: Secondary | ICD-10-CM | POA: Diagnosis not present

## 2022-06-05 DIAGNOSIS — Z794 Long term (current) use of insulin: Secondary | ICD-10-CM | POA: Diagnosis not present

## 2022-06-05 DIAGNOSIS — G2581 Restless legs syndrome: Secondary | ICD-10-CM

## 2022-06-05 DIAGNOSIS — E1159 Type 2 diabetes mellitus with other circulatory complications: Secondary | ICD-10-CM | POA: Diagnosis not present

## 2022-06-05 DIAGNOSIS — F419 Anxiety disorder, unspecified: Secondary | ICD-10-CM

## 2022-06-05 LAB — POCT GLYCOSYLATED HEMOGLOBIN (HGB A1C): Hemoglobin A1C: 9 % — AB (ref 4.0–5.6)

## 2022-06-05 MED ORDER — ROSUVASTATIN CALCIUM 40 MG PO TABS: 40.0000 mg | ORAL_TABLET | Freq: Every day | ORAL | 3 refills | Status: DC

## 2022-06-05 NOTE — Assessment & Plan Note (Signed)
Okay with the requip and 1/2 clonazepam prn

## 2022-06-05 NOTE — Assessment & Plan Note (Signed)
Controlled with the sertraline

## 2022-06-05 NOTE — Addendum Note (Signed)
Addended by: Pilar Grammes on: 06/05/2022 09:11 AM   Modules accepted: Orders

## 2022-06-05 NOTE — Assessment & Plan Note (Signed)
Marked worsening with A1c up to 9% Clearly all lifestyle issues and he is determined to get back to proper eating Continue semglee 35 bid, farxiga 10, metformin 1000 bid and glimepiride 4mg  daily He will get his CGM back so he can monitor correctly Recheck 3 months

## 2022-06-05 NOTE — Progress Notes (Signed)
Subjective:    Patient ID: [name] [name], male    DOB: May 21, 1970, 52 y.o.   MRN: [id number]  HPI Here for follow up of diabetes and other chronic health conditions  Knows his sugars are too high Not eating right--"I fell off the wagon" Doesn't have Dexcom now ---didn't get it renewed  Has done some treadmill work---and walks some when he has a break at work Weight is stable  Mood has been okay No ongoing depression and anxiety is controlled  No muscle aches or GI problems with crestor LDL not at goal No chest pain or SOB No dizziness or syncope  Current Outpatient Medications on File Prior to Visit  Medication Sig Dispense Refill   acetaminophen (TYLENOL) 500 MG tablet Take 2 tablets (1,000 mg total) by mouth every 6 (six) hours as needed. 30 tablet 0   aspirin 81 MG chewable tablet Chew 1 tablet (81 mg total) by mouth daily. 30 tablet 0   clonazePAM (KLONOPIN) 1 MG tablet TAKE 1/2 TO 1 TABLET AT BEDTIME IF NEEDED FOR RESTLESS LEGS 30 tablet 0   dapagliflozin propanediol (FARXIGA) 10 MG TABS tablet TAKE ONE TABLET (10 MG) BY MOUTH EVERY DAY 90 tablet 0   glimepiride (AMARYL) 4 MG tablet TAKE 1 TABLET BY MOUTH DAILY BEFORE BREAKFAST (NEEDS OFFICE VISIT) 90 tablet 3   lisinopril (ZESTRIL) 10 MG tablet Take 1 tablet (10 mg total) by mouth daily. PLEASE KEEP APPOINTMENT ON 05/29/2022 FOR FURTHER REFILLS. THANK YOU. 30 tablet 0   metFORMIN (GLUCOPHAGE) 1000 MG tablet TAKE ONE (1) TABLET BY MOUTH TWO TIMES PER DAY WITH MEALS 180 tablet 3   metoprolol tartrate (LOPRESSOR) 25 MG tablet Take 1 tablet (25 mg total) by mouth 2 (two) times daily. 180 tablet 1   nitroGLYCERIN (NITROSTAT) 0.4 MG SL tablet Place 1 tablet (0.4 mg total) under the tongue every 5 (five) minutes as needed for chest pain. 25 tablet prn   rOPINIRole (REQUIP) 4 MG tablet TAKE ONE TABLET (4 MG) BY MOUTH AT BEDTIME 90 tablet 1   rosuvastatin (CRESTOR) 20 MG tablet Take 1 tablet (20 mg total) by mouth daily. 90 tablet 1    SEMGLEE, YFGN, 100 UNIT/ML Pen ADMINISTER 35 UNITS UNDER THE SKIN EVERY MORNING AND EVERY NIGHT AT BEDTIME 30 mL 10   sertraline (ZOLOFT) 100 MG tablet TAKE 1 TABLET BY MOUTH DAILY 90 tablet 3   No current facility-administered medications on file prior to visit.    Allergies  Allergen Reactions   Simvastatin Other (See Comments)    Elevated LFTs?    Past Medical History:  Diagnosis Date   Anxiety    Coronary artery calcification    a. 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   LIMA to LAD, RIMA to AM, SVG to D2, SVG to OM1, SVG to PDA, EVH via right thigh and leg   Type II diabetes mellitus ([place])     Past Surgical History:  Procedure Laterality Date   CARDIAC CATHETERIZATION  07/19/2016   CORONARY ARTERY BYPASS GRAFT N/A 07/26/2016   Procedure: CORONARY ARTERY BYPASS GRAFTING (CABG), ON PUMP, TIMES FIVE, USING BILATERAL INTERNAL MAMMARY ARTERIES AND ENDOSCOPICALLY HARVESTED RIGHT GREATER SAPHENOUS VEIN;  Surgeon: Rexene Alberts, MD;  Location: [place];  Service: Open Heart Surgery;  Laterality: N/A;  LIMA to LAD RIMA to Acute Marginal SVG to PDA SVG to Diag 2 SVG to OM1   LEFT HEART CATH AND CORONARY ANGIOGRAPHY N/A 07/19/2016   Procedure: Left Heart Cath and Coronary Angiography;  Surgeon: Jettie Booze, MD;  Location: [place] [facility];  Service: Cardiovascular;  Laterality: N/A;   ORCHIOPEXY Right ~ 1980   "undescending testicle"   TEE WITHOUT CARDIOVERSION N/A 07/26/2016   Procedure: TRANSESOPHAGEAL ECHOCARDIOGRAM (TEE);  Surgeon: Rexene Alberts, MD;  Location: [place];  Service: Open Heart Surgery;  Laterality: N/A;    Family History  Problem Relation Age of Onset   Heart failure Mother    Diabetes Mother    CAD Paternal Grandfather    Heart attack Paternal Grandfather    Heart attack Paternal Uncle    Cancer Neg Hx      Social History   Socioeconomic History   Marital status: Married    Spouse name: Not on file   Number of children: 0   Years of education: Not on file   Highest education level: Not on file  Occupational History   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[employer]  Tobacco Use   Smoking status: Never    Passive exposure: Never   Smokeless tobacco: Never  Vaping Use   Vaping Use: Never used  Substance and Sexual Activity   Alcohol use: No   Drug use: No   Sexual activity: Yes  Other Topics Concern   Not on file  Social History Narrative   Divorced then remarried 2015   2 stepchildren from prior marriage   2 stepsons from this marriage   Social Determinants of Radio broadcast assistant Strain: Not on file  Food Insecurity: Not on file  Transportation Needs: Not on file  Physical Activity: Not on file  Stress: Not on file  Social Connections: Not on file  Intimate Partner Violence: Not on file   Review of Systems Sleeps okay mostly Some trouble with his legs at night----better if he takes a hot bath before bed (and still on the requip)     Objective:   Physical Exam Constitutional:      Appearance: Normal appearance.  Cardiovascular:     Rate and Rhythm: Normal rate and regular rhythm.     Pulses: Normal pulses.     Heart sounds: No murmur heard.    No gallop.  Pulmonary:     Effort: Pulmonary effort is normal.     Breath sounds: Normal breath sounds. No wheezing or rales.  Musculoskeletal:     Cervical back: Neck supple.     Right lower leg: No edema.     Left lower leg: No edema.  Lymphadenopathy:     Cervical: No cervical adenopathy.  Skin:    Comments: No foot lesions  Neurological:     Mental Status: He is alert.  Psychiatric:        Mood and Affect: Mood normal.        Behavior: Behavior normal.            Assessment & Plan:

## 2022-06-05 NOTE — Assessment & Plan Note (Signed)
No symptoms on lisinopril 10, ASA 81, metoprolol 25 bid Will increase rosuvastatin to 40mg  daily

## 2022-06-18 ENCOUNTER — Other Ambulatory Visit: Payer: Self-pay | Admitting: Internal Medicine

## 2022-06-18 NOTE — Telephone Encounter (Signed)
Last visit 05/29/22, no med change, f/u Follow up in 1 year

## 2022-07-16 ENCOUNTER — Other Ambulatory Visit: Payer: Self-pay | Admitting: Internal Medicine

## 2022-07-18 ENCOUNTER — Other Ambulatory Visit: Payer: Self-pay

## 2022-08-20 ENCOUNTER — Other Ambulatory Visit: Payer: Self-pay | Admitting: Internal Medicine

## 2022-08-27 ENCOUNTER — Other Ambulatory Visit: Payer: Self-pay | Admitting: Medical

## 2022-09-05 ENCOUNTER — Ambulatory Visit: Payer: Self-pay | Admitting: Internal Medicine

## 2022-09-10 ENCOUNTER — Other Ambulatory Visit: Payer: Self-pay | Admitting: Internal Medicine

## 2022-09-11 NOTE — Telephone Encounter (Signed)
Last filled 04-02-22 #30 Last OV 06-05-22 Next OV 09-19-22 [facility]

## 2022-09-19 ENCOUNTER — Ambulatory Visit: Payer: Self-pay | Admitting: Internal Medicine

## 2022-09-22 ENCOUNTER — Other Ambulatory Visit (HOSPITAL_COMMUNITY): Payer: Self-pay

## 2022-09-25 ENCOUNTER — Other Ambulatory Visit (HOSPITAL_COMMUNITY): Payer: Self-pay

## 2022-09-25 ENCOUNTER — Telehealth: Payer: Self-pay | Admitting: Pharmacy Technician

## 2022-09-25 NOTE — Telephone Encounter (Signed)
Pharmacy Patient Advocate Encounter   Received notification from CoverMyMeds that prior authorization for Farxiga 10MG  tablets is required/requested.   Insurance verification completed.   The patient is insured through  [employer]  .   Per test claim: PA submitted to [employer] via CoverMyMeds Key/confirmation #/EOC [employer] Status is pending

## 2022-09-26 ENCOUNTER — Other Ambulatory Visit (HOSPITAL_COMMUNITY): Payer: Self-pay

## 2022-09-26 ENCOUNTER — Encounter: Payer: Self-pay | Admitting: Internal Medicine

## 2022-09-26 ENCOUNTER — Other Ambulatory Visit: Payer: Self-pay | Admitting: Internal Medicine

## 2022-09-26 MED ORDER — DAPAGLIFLOZIN PROPANEDIOL 10 MG PO TABS: 10.0000 mg | ORAL_TABLET | Freq: Every day | ORAL | 0 refills | Status: DC

## 2022-09-26 NOTE — Telephone Encounter (Signed)
Dr. Alphonsus Sias before I send a refill and start the PA process pt wants to know if there are cheaper options to replace Farxiga?

## 2022-09-28 NOTE — Telephone Encounter (Signed)
Called and spoke with patient.  He plans to stay with [place].  Asked if PA is needed and he reported that he needs to pick up medication at the pharmacy.  No further action required.

## 2022-10-01 ENCOUNTER — Other Ambulatory Visit (HOSPITAL_COMMUNITY): Payer: Self-pay

## 2022-10-02 NOTE — Telephone Encounter (Signed)
Pharmacy Patient Advocate Encounter  Received notification from  [employer]  that Prior Authorization for Farxiga 10mg  Tablet has been DENIED. Please advise how you'd like to proceed. Full denial letter will be uploaded to the media tab. See denial reason below.  PA #/Case ID/Reference #: [id number]   Denied due to plan exclusion

## 2022-10-04 ENCOUNTER — Other Ambulatory Visit (HOSPITAL_COMMUNITY): Payer: Self-pay

## 2022-10-04 MED ORDER — EMPAGLIFLOZIN 25 MG PO TABS: 25.0000 mg | ORAL_TABLET | Freq: Every day | ORAL | 3 refills | Status: AC

## 2022-10-04 NOTE — Telephone Encounter (Signed)
Let him know I sent a prescription for jardiance 25mg  to [facility]. I think that is just as good as farxiga. He should monitor his sugars closely for a while after the change--just in case

## 2022-10-04 NOTE — Addendum Note (Signed)
Addended by: Tillman Abide I on: 10/04/2022 01:16 PM   Modules accepted: Orders

## 2022-10-04 NOTE — Telephone Encounter (Signed)
Spoke to pt

## 2022-10-04 NOTE — Telephone Encounter (Signed)
Per denial letter, [name] [name] must be tried/failed before they will pay for Farxiga.

## 2022-10-08 ENCOUNTER — Ambulatory Visit: Payer: Self-pay | Admitting: Internal Medicine

## 2022-10-15 ENCOUNTER — Other Ambulatory Visit: Payer: Self-pay | Admitting: Internal Medicine

## 2022-10-18 ENCOUNTER — Ambulatory Visit: Payer: No Typology Code available for payment source | Admitting: Internal Medicine

## 2022-10-23 ENCOUNTER — Other Ambulatory Visit: Payer: Self-pay | Admitting: Internal Medicine

## 2022-10-25 ENCOUNTER — Encounter (INDEPENDENT_AMBULATORY_CARE_PROVIDER_SITE_OTHER): Payer: Self-pay

## 2022-11-12 ENCOUNTER — Other Ambulatory Visit: Payer: Self-pay | Admitting: Internal Medicine

## 2022-11-15 ENCOUNTER — Ambulatory Visit: Payer: No Typology Code available for payment source | Admitting: Internal Medicine

## 2022-11-26 ENCOUNTER — Other Ambulatory Visit: Payer: Self-pay | Admitting: Medical

## 2022-11-26 ENCOUNTER — Other Ambulatory Visit: Payer: Self-pay | Admitting: Internal Medicine

## 2022-11-27 MED ORDER — ROSUVASTATIN CALCIUM 40 MG PO TABS: 40.0000 mg | ORAL_TABLET | Freq: Every day | ORAL | 1 refills | Status: DC

## 2022-12-10 ENCOUNTER — Other Ambulatory Visit: Payer: Self-pay | Admitting: Internal Medicine

## 2022-12-17 ENCOUNTER — Other Ambulatory Visit: Payer: Self-pay | Admitting: Internal Medicine

## 2022-12-17 NOTE — Telephone Encounter (Signed)
LAST APPOINTMENT DATE: 06/05/22 DM f/u    NEXT APPOINTMENT DATE: 12/20/2022    LAST REFILL: 10/23/22  QTY: #30 no rf

## 2022-12-20 ENCOUNTER — Ambulatory Visit: Payer: No Typology Code available for payment source | Admitting: Internal Medicine

## 2022-12-20 ENCOUNTER — Encounter: Payer: Self-pay | Admitting: Internal Medicine

## 2022-12-20 VITALS — BP 126/84 | HR 71 | Temp 98.7°F | Ht 69.0 in | Wt 191.0 lb

## 2022-12-20 DIAGNOSIS — Z7984 Long term (current) use of oral hypoglycemic drugs: Secondary | ICD-10-CM

## 2022-12-20 DIAGNOSIS — Z794 Long term (current) use of insulin: Secondary | ICD-10-CM | POA: Diagnosis not present

## 2022-12-20 DIAGNOSIS — I2510 Atherosclerotic heart disease of native coronary artery without angina pectoris: Secondary | ICD-10-CM | POA: Diagnosis not present

## 2022-12-20 DIAGNOSIS — E1159 Type 2 diabetes mellitus with other circulatory complications: Secondary | ICD-10-CM

## 2022-12-20 LAB — POCT GLYCOSYLATED HEMOGLOBIN (HGB A1C): Hemoglobin A1C: 9.5 % — AB (ref 4.0–5.6)

## 2022-12-20 NOTE — Progress Notes (Signed)
Subjective:    Patient ID: [name] [name], male    DOB: 13-Jan-1971, 52 y.o.   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 30 tablet 0   aspirin 81 MG chewable tablet Chew 1 tablet (81 mg total) by mouth daily. 30 tablet 0   clonazePAM (KLONOPIN) 1 MG tablet TAKE 1/2 TO 1 TABLET AT BEDTIME IF NEEDED FOR RESTLESS LEGS 30 tablet 0   empagliflozin (JARDIANCE) 25 MG TABS tablet Take 1 tablet (25 mg total) by mouth daily before breakfast. 90 tablet 3   glimepiride (AMARYL) 4 MG tablet TAKE 1 TABLET BY MOUTH DAILY BEFORE BREAKFAST (NEEDS OFFICE VISIT) 90 tablet 3   insulin glargine-yfgn (SEMGLEE, YFGN,) 100 UNIT/ML Pen ADMINISTER 35 UNITS UNDER THE SKIN EVERY MORNING AND EVERY NIGHT AT BEDTIME 30 mL 1   lisinopril (ZESTRIL) 10 MG tablet TAKE 1 TABLET BY MOUTH DAILY 30 tablet 3   metFORMIN (GLUCOPHAGE) 1000 MG tablet TAKE ONE (1) TABLET BY MOUTH TWO TIMES PER DAY WITH MEALS 180 tablet 3   metoprolol tartrate (LOPRESSOR) 25 MG tablet TAKE ONE TABLET BY MOUTH TWICE DAILY 180 tablet 1   nitroGLYCERIN (NITROSTAT) 0.4 MG SL tablet Place 1 tablet (0.4 mg total) under the tongue every 5 (five) minutes as needed for chest pain. 25 tablet prn   rOPINIRole (REQUIP) 4 MG tablet TAKE ONE TABLET (4 MG) BY MOUTH AT BEDTIME 90 tablet 3   rosuvastatin (CRESTOR) 40 MG tablet Take 1 tablet  (40 mg total) by mouth daily. 90 tablet 1   sertraline (ZOLOFT) 100 MG tablet TAKE 1 TABLET BY MOUTH DAILY 90 tablet 0   No current facility-administered medications on file prior to visit.    Allergies  Allergen Reactions   Simvastatin Other (See Comments)    Elevated LFTs?    Past Medical History:  Diagnosis Date   Anxiety    Coronary artery calcification    a. 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   LIMA to LAD, RIMA to AM, SVG to D2, SVG to OM1, SVG to PDA, EVH via right thigh and leg   Type II diabetes mellitus ([facility])     Past Surgical History:  Procedure Laterality Date   CARDIAC CATHETERIZATION  07/19/2016   CORONARY ARTERY BYPASS GRAFT N/A 07/26/2016   Procedure: CORONARY ARTERY BYPASS GRAFTING (CABG), ON PUMP, TIMES FIVE, USING BILATERAL INTERNAL MAMMARY ARTERIES AND ENDOSCOPICALLY HARVESTED RIGHT GREATER SAPHENOUS VEIN;  Surgeon: Purcell Nails, MD;  Location: [facility];  Service: Open Heart Surgery;  Laterality: N/A;  LIMA to LAD RIMA to Acute Marginal SVG to PDA SVG to Diag 2 SVG to OM1   LEFT  HEART CATH AND CORONARY ANGIOGRAPHY N/A 07/19/2016   Procedure: Left Heart Cath and Coronary Angiography;  Surgeon: Corky Crafts, MD;  Location: [facility];  Service: Cardiovascular;  Laterality: N/A;   ORCHIOPEXY Right ~ 1980   "undescending testicle"   TEE WITHOUT CARDIOVERSION N/A 07/26/2016   Procedure: TRANSESOPHAGEAL ECHOCARDIOGRAM (TEE);  Surgeon: Purcell Nails, MD;  Location: [facility];  Service: Open Heart Surgery;  Laterality: N/A;    Family History  Problem Relation Age of Onset   Heart failure Mother    Diabetes Mother    CAD Paternal Grandfather    Heart attack Paternal Grandfather    Heart attack Paternal Uncle    Cancer Neg Hx     Social History   Socioeconomic History   Marital status: Married     Spouse name: Not on file   Number of children: 0   Years of education: Not on file   Highest education level: Not on file  Occupational History   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[employer]  Tobacco Use   Smoking status: Never    Passive exposure: Never   Smokeless tobacco: Never  Vaping Use   Vaping status: Never Used  Substance and Sexual Activity   Alcohol use: No   Drug use: No   Sexual activity: Yes  Other Topics Concern   Not on file  Social History Narrative   Divorced then remarried 2015   2 stepchildren from prior marriage   2 stepsons from this marriage   Social Determinants of Corporate investment banker Strain: Not on file  Food Insecurity: Not on file  Transportation Needs: Not on file  Physical Activity: Not on file  Stress: Not on file  Social Connections: Not on file  Intimate Partner Violence: Not on file   Review of Systems Has cut caffeine drinks---has helped his legs at night    Objective:   Physical Exam Constitutional:      Appearance: Normal appearance.  Cardiovascular:     Rate and Rhythm: Normal rate and regular rhythm.     Pulses: Normal pulses.     Heart sounds: No murmur heard.    No gallop.  Pulmonary:     Effort: Pulmonary effort is normal.     Breath sounds: Normal breath sounds. No wheezing or rales.  Musculoskeletal:     Cervical back: Neck supple.  Lymphadenopathy:     Cervical: No cervical adenopathy.  Neurological:     Mental Status: He is alert.  Psychiatric:        Mood and Affect: Mood normal.        Behavior: Behavior normal.            Assessment & Plan:

## 2022-12-20 NOTE — Assessment & Plan Note (Signed)
No symptoms Has to get back to exercise

## 2022-12-20 NOTE — Assessment & Plan Note (Signed)
Still hasn't improved lifestyle Lab Results  Component Value Date   HGBA1C 9.5 (A) 12/20/2022   Already on jardiance 25, glimeriide 4, metofrmin 1000 bid, insulin 35 bid Next step would be GLP-1 agonist Will give him one more try for lifestyle

## 2023-02-19 ENCOUNTER — Other Ambulatory Visit: Payer: Self-pay | Admitting: Internal Medicine

## 2023-02-26 ENCOUNTER — Other Ambulatory Visit: Payer: Self-pay | Admitting: Medical

## 2023-03-11 ENCOUNTER — Other Ambulatory Visit: Payer: Self-pay | Admitting: Internal Medicine

## 2023-03-22 ENCOUNTER — Encounter: Payer: No Typology Code available for payment source | Admitting: Internal Medicine

## 2023-04-08 ENCOUNTER — Other Ambulatory Visit: Payer: Self-pay | Admitting: Internal Medicine

## 2023-05-16 ENCOUNTER — Other Ambulatory Visit: Payer: Self-pay | Admitting: Internal Medicine

## 2023-05-16 NOTE — Telephone Encounter (Signed)
 Pt is past due for 3 month follow-up for Diabetes with Dr Alphonsus Sias. Please help him get scheduled.

## 2023-05-28 ENCOUNTER — Other Ambulatory Visit: Payer: Self-pay | Admitting: Medical

## 2023-05-30 ENCOUNTER — Other Ambulatory Visit: Payer: Self-pay | Admitting: Medical

## 2023-06-10 ENCOUNTER — Other Ambulatory Visit: Payer: Self-pay | Admitting: Internal Medicine

## 2023-07-01 ENCOUNTER — Other Ambulatory Visit: Payer: Self-pay | Admitting: Medical

## 2023-07-08 ENCOUNTER — Other Ambulatory Visit: Payer: Self-pay | Admitting: Internal Medicine

## 2023-07-15 ENCOUNTER — Other Ambulatory Visit: Payer: Self-pay | Admitting: Medical

## 2023-07-15 ENCOUNTER — Telehealth: Payer: Self-pay | Admitting: Internal Medicine

## 2023-07-15 NOTE — Telephone Encounter (Signed)
*  STAT* If patient is at the pharmacy, call can be transferred to refill team.   1. Which medications need to be refilled? (please list name of each medication and dose if known) metoprolol  tartrate (LOPRESSOR ) 25 MG tablet    2. Would you like to learn more about the convenience, safety, & potential cost savings by using the [facility]?    3. Are you open to using the [facility] (Type [facility]. ).   4. Which pharmacy/location (including street and city if local pharmacy) is medication to be sent to? [facility] - [place], [place] - [address]    5. Do they need a 30 day or 90 day supply? 90 day

## 2023-07-16 MED ORDER — METOPROLOL TARTRATE 25 MG PO TABS: 25.0000 mg | ORAL_TABLET | Freq: Two times a day (BID) | ORAL | 0 refills | Status: DC

## 2023-08-06 ENCOUNTER — Other Ambulatory Visit: Payer: Self-pay | Admitting: Internal Medicine

## 2023-08-07 ENCOUNTER — Other Ambulatory Visit: Payer: Self-pay | Admitting: Internal Medicine

## 2023-08-15 ENCOUNTER — Ambulatory Visit: Payer: Self-pay | Admitting: Medical

## 2023-08-19 ENCOUNTER — Other Ambulatory Visit: Payer: Self-pay | Admitting: Internal Medicine

## 2023-08-27 ENCOUNTER — Other Ambulatory Visit: Payer: Self-pay | Admitting: Medical

## 2023-08-27 MED ORDER — ROSUVASTATIN CALCIUM 40 MG PO TABS: 40.0000 mg | ORAL_TABLET | Freq: Every day | ORAL | 0 refills | Status: DC

## 2023-09-02 ENCOUNTER — Ambulatory Visit: Payer: Self-pay | Admitting: Medical

## 2023-09-10 ENCOUNTER — Ambulatory Visit: Payer: Self-pay | Admitting: Medical

## 2023-09-10 ENCOUNTER — Other Ambulatory Visit: Payer: Self-pay | Admitting: Internal Medicine

## 2023-09-11 ENCOUNTER — Ambulatory Visit: Payer: Self-pay | Admitting: Medical

## 2023-09-11 NOTE — Progress Notes (Unsigned)
 Cardiology Office Note    Date:  09/12/2023   ID:  [name] [name], DOB May 29, 1970, MRN [id number]  PCP:  Jimmy Charlie FERNS, MD  Cardiologist:  Lonni Hanson, MD  Electrophysiologist:  None   Chief Complaint: 1 year follow up  History of Present Illness:   [name] [name] is a 53 y.o. male with history of CAD s/p CABG in 07/2016, hypertension, type 2 diabetes, and anxiety who presents for follow up on CAD.    Patient underwent exercise MPI 07/2016 which was high risk with 2 to 3 mm inferior ST depression and perfusion imaging showing reversible defect.  He underwent left heart catheterization which showed severe native three-vessel disease.  He underwent CABG 07/2016 with a LIMA to LAD, RIMA to acute marginal of RCA, SVG to 1 PDA, SVG to OM1, and SVG to D2.  Intraoperative TEE at that time showed EF of 45 to 50%.   Patient was most recently seen in office 05/29/2022 and overall doing well from a cardiac perspective.  At that time, he was motivated to make healthier lifestyle choices including walking and eating better.  No further testing or medication changes were indicated at that time.  Patient reports overall doing well from a cardiac perspective.  He is without symptoms of angina or cardiac decompensation.  He denies chest pain, shortness of breath, lightheadedness, dizziness, palpitations, and lower extremity swelling.  He recently started seeing an endocrinologist for further management of his diabetes.  He reports that he is looking into joining a gym so he can be more active.  Labs independently reviewed: 08/22/2023-Hgb 15.6, HCT 47.6, BUN 20, creatinine 0.96, sodium 135, K4.8, normal LFTs, TC 141, TG 164, HDL 35, LDL 78, lipoprotein a 30.9, A1c 10.3  Objective   Past Medical History:  Diagnosis Date   Anxiety    Coronary artery calcification    a. noted on CT angio 07/16/16   Coronary artery disease    High cholesterol    HTN (hypertension)    OSA on CPAP    don't wear it that  much (07/19/2016)   S/P CABG x 5 07/26/2016   LIMA to LAD, RIMA to AM, SVG to D2, SVG to OM1, SVG to PDA, EVH via right thigh and leg   Type II diabetes mellitus (HCC)     Current Medications: Current Meds  Medication Sig   acetaminophen  (TYLENOL ) 500 MG tablet Take 2 tablets (1,000 mg total) by mouth every 6 (six) hours as needed.   aspirin  81 MG chewable tablet Chew 1 tablet (81 mg total) by mouth daily.   clonazePAM  (KLONOPIN ) 1 MG tablet TAKE 1/2 TO 1 TABLET AT BEDTIME IF NEEDED FOR RESTLESS LEGS   empagliflozin  (JARDIANCE ) 25 MG TABS tablet Take 1 tablet (25 mg total) by mouth daily before breakfast.   lisinopril  (ZESTRIL ) 10 MG tablet Take 1 tablet (10 mg total) by mouth daily.   metFORMIN  (GLUCOPHAGE ) 1000 MG tablet TAKE ONE (1) TABLET BY MOUTH TWO TIMES PER DAY WITH MEALS (Patient taking differently: Take 500 mg by mouth 2 (two) times daily with a meal.)   metoprolol  tartrate (LOPRESSOR ) 25 MG tablet Take 1 tablet (25 mg total) by mouth 2 (two) times daily. Pt needs to keep upcoming appt in June for further refills   nitroGLYCERIN  (NITROSTAT ) 0.4 MG SL tablet Place 1 tablet (0.4 mg total) under the tongue every 5 (five) minutes as needed for chest pain.   rOPINIRole  (REQUIP ) 4 MG tablet TAKE ONE TABLET (4 MG)  BY MOUTH AT BEDTIME   rosuvastatin  (CRESTOR ) 40 MG tablet Take 1 tablet (40 mg total) by mouth daily.   SEMGLEE , YFGN, 100 UNIT/ML Pen ADMINISTER 35 UNITS UNDER THE SKIN EVERY MORNING AND EVERY NIGHT AT BEDTIME (Patient taking differently: 50 Units daily.)   sertraline  (ZOLOFT ) 100 MG tablet TAKE 1 TABLET (100 MG) BY MOUTH DAILY. **MUST SCHEDULE OFFICE VISIT**   tirzepatide (MOUNJARO) 2.5 MG/0.5ML Pen Inject 2.5 mg into the skin once a week.    Allergies:   Simvastatin   Social History   Socioeconomic History   Marital status: Married    Spouse name: Not on file   Number of children: 0   Years of education: Not on file   Highest education level: Not on file  Occupational  History   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[employer]  Tobacco Use   Smoking status: Never    Passive exposure: Never   Smokeless tobacco: Never  Vaping Use   Vaping status: Never Used  Substance and Sexual Activity   Alcohol use: No   Drug use: No   Sexual activity: Yes  Other Topics Concern   Not on file  Social History Narrative   Divorced then remarried 2015   2 stepchildren from prior marriage   2 stepsons from this marriage   Social Drivers of Corporate investment banker Strain: Not on file  Food Insecurity: Not on file  Transportation Needs: Not on file  Physical Activity: Not on file  Stress: Not on file  Social Connections: Not on file     Family History:  The patient's family history includes CAD in his paternal grandfather; Diabetes in his mother; Heart attack in his paternal grandfather and paternal uncle; Heart failure in his mother. There is no history of Cancer.  ROS:   12-point review of systems is negative unless otherwise noted in the HPI.  EKGs/Other Studies Reviewed:    EKG:  EKG personally reviewed by me today EKG Interpretation Date/Time:  Thursday September 12 2023 08:38:08 EDT Ventricular Rate:  108 PR Interval:  150 QRS Duration:  72 QT Interval:  328 QTC Calculation: 439 R Axis:   50  Text Interpretation: Sinus tachycardia When compared with ECG of 27-Jul-2016 07:00, No significant change was found Confirmed by Lorene Sinclair ([id number]) on 09/12/2023 8:41:03 AM  PHYSICAL EXAM:    VS:  BP 102/70 (BP Location: Left Arm, Patient Position: Sitting, Cuff Size: Normal)   Pulse (!) 108   Ht 5' 9 (1.753 m)   Wt 186 lb 8 oz (84.6 kg)   SpO2 96%   BMI 27.54 kg/m   BMI: Body mass index is 27.54 kg/m.  Physical Exam Vitals and nursing note reviewed.  Constitutional:      Appearance: Normal appearance.  Cardiovascular:     Rate and Rhythm: Normal rate and regular rhythm.     Heart sounds: No murmur heard.    No gallop.  Pulmonary:     Effort:  Pulmonary effort is normal.     Breath sounds: Normal breath sounds. No wheezing or rales.  Musculoskeletal:     Right lower leg: No edema.     Left lower leg: No edema.  Skin:    General: Skin is warm and dry.  Neurological:     General: No focal deficit present.     Mental Status: He is alert and oriented to person, place, and time. Mental status is at baseline.  Psychiatric:  Mood and Affect: Mood normal.        Behavior: Behavior normal.     Wt Readings from Last 3 Encounters:  09/12/23 186 lb 8 oz (84.6 kg)  12/20/22 191 lb (86.6 kg)  06/05/22 192 lb (87.1 kg)        ASSESSMENT & PLAN:   Coronary artery disease - S/p CABG in 2018.  He is without symptoms of angina or cardiac decompensation.  No further ischemic evaluation indicated at this time.  Continue aspirin  81 mg daily and rosuvastatin  40 mg daily.  Hypertension - Blood pressure is well-controlled on current regimen.  Continue lisinopril  10 mg daily and metoprolol  tartrate 25 mg twice daily.  Hyperlipidemia - Most recent lipid panel with LDL 78, goal less than 55.  Discussed addition of ezetimibe.  Patient would like to defer at this time and focus on lifestyle modifications.  Recheck lipid panel at follow-up.    Disposition: F/u with Dr. Mady or an APP in 6 months.   Medication Adjustments/Labs and Tests Ordered: Current medicines are reviewed at length with the patient today.  Concerns regarding medicines are outlined above. Medication changes, Labs and Tests ordered today are summarized above and listed in the Patient Instructions accessible in Encounters.   Bonney Lesley Maffucci, PA-C 09/12/2023 8:58 AM      [facility] [address] [place], [place] [address] [phone]

## 2023-09-12 ENCOUNTER — Encounter: Payer: Self-pay | Admitting: Medical

## 2023-09-12 ENCOUNTER — Ambulatory Visit: Payer: Self-pay | Attending: Medical | Admitting: Physician Assistant

## 2023-09-12 VITALS — BP 102/70 | HR 108 | Ht 69.0 in | Wt 186.5 lb

## 2023-09-12 DIAGNOSIS — E782 Mixed hyperlipidemia: Secondary | ICD-10-CM | POA: Diagnosis not present

## 2023-09-12 DIAGNOSIS — I2510 Atherosclerotic heart disease of native coronary artery without angina pectoris: Secondary | ICD-10-CM | POA: Diagnosis not present

## 2023-09-12 DIAGNOSIS — I10 Essential (primary) hypertension: Secondary | ICD-10-CM

## 2023-09-12 MED ORDER — METOPROLOL TARTRATE 25 MG PO TABS: 25.0000 mg | ORAL_TABLET | Freq: Two times a day (BID) | ORAL | 0 refills | Status: AC

## 2023-09-12 MED ORDER — METOPROLOL TARTRATE 25 MG PO TABS: 25.0000 mg | ORAL_TABLET | Freq: Two times a day (BID) | ORAL | 0 refills | Status: DC

## 2023-09-12 NOTE — Patient Instructions (Signed)
 Medication Instructions:  Your physician recommends that you continue on your current medications as directed. Please refer to the Current Medication list given to you today.   *If you need a refill on your cardiac medications before your next appointment, please call your pharmacy*  Lab Work: None ordered at this time   Follow-Up: At [facility], you and your health needs are our priority.  As part of our continuing mission to provide you with exceptional heart care, our providers are all part of one team.  This team includes your primary Cardiologist (physician) and Advanced Practice Providers or APPs (Physician Assistants and Nurse Practitioners) who all work together to provide you with the care you need, when you need it.  Your next appointment:   6 month(s)  Provider:   You may see Lonni Hanson, MD or Cadence Franchester, PA-C

## 2023-09-24 ENCOUNTER — Other Ambulatory Visit: Payer: Self-pay | Admitting: Internal Medicine

## 2023-10-01 ENCOUNTER — Other Ambulatory Visit: Payer: Self-pay | Admitting: Medical

## 2023-10-21 ENCOUNTER — Other Ambulatory Visit: Payer: Self-pay | Admitting: Internal Medicine

## 2023-10-21 NOTE — Telephone Encounter (Signed)
 Left message on VM per DPR advising pt that he needs to see Dr Jimmy in the next 2 weeks before he retires. He is very far behind on following up on his Diabetes and other issues.

## 2023-10-23 ENCOUNTER — Telehealth: Payer: Self-pay

## 2023-10-23 NOTE — Telephone Encounter (Signed)
 CAlled pt to let him know I had changed his appt to a CPE tomorrow. He said he is seeing a Dr in [place] now and we can cancel the appt with Dr Jimmy. Removed him as PCP.

## 2023-10-24 ENCOUNTER — Ambulatory Visit: Admitting: Internal Medicine

## 2023-10-28 ENCOUNTER — Other Ambulatory Visit: Payer: Self-pay | Admitting: Internal Medicine

## 2023-12-31 ENCOUNTER — Other Ambulatory Visit: Payer: Self-pay | Admitting: Medical
